# Patient Record
Sex: Female | Born: 1940 | Race: White | Hispanic: No | Smoking: Never smoker
Health system: Southern US, Community
[De-identification: ages and names within clinical notes are randomized; demographics above are authoritative.]

## PROBLEM LIST (undated history)

## (undated) DIAGNOSIS — M79672 Pain in left foot: Secondary | ICD-10-CM

## (undated) DIAGNOSIS — R2230 Localized swelling, mass and lump, unspecified upper limb: Secondary | ICD-10-CM

## (undated) DIAGNOSIS — M8580 Other specified disorders of bone density and structure, unspecified site: Secondary | ICD-10-CM

## (undated) DIAGNOSIS — Z8619 Personal history of other infectious and parasitic diseases: Secondary | ICD-10-CM

## (undated) DIAGNOSIS — E785 Hyperlipidemia, unspecified: Secondary | ICD-10-CM

## (undated) DIAGNOSIS — Z0000 Encounter for general adult medical examination without abnormal findings: Secondary | ICD-10-CM

## (undated) DIAGNOSIS — B019 Varicella without complication: Secondary | ICD-10-CM

## (undated) DIAGNOSIS — D709 Neutropenia, unspecified: Secondary | ICD-10-CM

## (undated) DIAGNOSIS — E559 Vitamin D deficiency, unspecified: Secondary | ICD-10-CM

## (undated) DIAGNOSIS — E079 Disorder of thyroid, unspecified: Secondary | ICD-10-CM

## (undated) DIAGNOSIS — R5383 Other fatigue: Principal | ICD-10-CM

## (undated) DIAGNOSIS — C4490 Unspecified malignant neoplasm of skin, unspecified: Secondary | ICD-10-CM

## (undated) DIAGNOSIS — D72819 Decreased white blood cell count, unspecified: Secondary | ICD-10-CM

## (undated) DIAGNOSIS — R030 Elevated blood-pressure reading, without diagnosis of hypertension: Secondary | ICD-10-CM

## (undated) DIAGNOSIS — K635 Polyp of colon: Secondary | ICD-10-CM

## (undated) DIAGNOSIS — C801 Malignant (primary) neoplasm, unspecified: Secondary | ICD-10-CM

## (undated) HISTORY — DX: Pain in left foot: M79.672

## (undated) HISTORY — DX: Neutropenia, unspecified: D70.9

## (undated) HISTORY — DX: Other fatigue: R53.83

## (undated) HISTORY — DX: Encounter for general adult medical examination without abnormal findings: Z00.00

## (undated) HISTORY — DX: Malignant (primary) neoplasm, unspecified: C80.1

## (undated) HISTORY — DX: Varicella without complication: B01.9

## (undated) HISTORY — DX: Polyp of colon: K63.5

## (undated) HISTORY — DX: Localized swelling, mass and lump, unspecified upper limb: R22.30

## (undated) HISTORY — DX: Personal history of other infectious and parasitic diseases: Z86.19

## (undated) HISTORY — DX: Decreased white blood cell count, unspecified: D72.819

## (undated) HISTORY — PX: CATARACT EXTRACTION, BILATERAL: SHX1313

## (undated) HISTORY — DX: Unspecified malignant neoplasm of skin, unspecified: C44.90

## (undated) HISTORY — DX: Disorder of thyroid, unspecified: E07.9

## (undated) HISTORY — DX: Vitamin D deficiency, unspecified: E55.9

## (undated) HISTORY — DX: Hyperlipidemia, unspecified: E78.5

## (undated) HISTORY — DX: Elevated blood-pressure reading, without diagnosis of hypertension: R03.0

## (undated) HISTORY — DX: Other specified disorders of bone density and structure, unspecified site: M85.80

---
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
## 1968-12-02 HISTORY — PX: OTHER SURGICAL HISTORY: SHX169

## 2001-12-02 HISTORY — PX: OTHER SURGICAL HISTORY: SHX169

## 2002-03-15 ENCOUNTER — Encounter: Payer: Self-pay | Admitting: Orthopedic Surgery

## 2002-03-22 ENCOUNTER — Inpatient Hospital Stay (HOSPITAL_COMMUNITY): Admission: RE | Admit: 2002-03-22 | Discharge: 2002-03-26 | Payer: Self-pay | Admitting: Orthopedic Surgery

## 2002-03-22 ENCOUNTER — Encounter: Payer: Self-pay | Admitting: Orthopedic Surgery

## 2002-03-28 ENCOUNTER — Emergency Department (HOSPITAL_COMMUNITY): Admission: EM | Admit: 2002-03-28 | Discharge: 2002-03-28 | Payer: Self-pay | Admitting: Emergency Medicine

## 2004-07-16 ENCOUNTER — Emergency Department (HOSPITAL_COMMUNITY): Admission: EM | Admit: 2004-07-16 | Discharge: 2004-07-16 | Payer: Self-pay

## 2005-07-09 ENCOUNTER — Encounter: Admission: RE | Admit: 2005-07-09 | Discharge: 2005-07-09 | Payer: Self-pay | Admitting: Family Medicine

## 2009-04-24 ENCOUNTER — Encounter: Admission: RE | Admit: 2009-04-24 | Discharge: 2009-04-24 | Payer: Self-pay | Admitting: *Deleted

## 2009-12-02 DIAGNOSIS — C801 Malignant (primary) neoplasm, unspecified: Secondary | ICD-10-CM

## 2009-12-02 DIAGNOSIS — C4490 Unspecified malignant neoplasm of skin, unspecified: Secondary | ICD-10-CM

## 2009-12-02 HISTORY — PX: OTHER SURGICAL HISTORY: SHX169

## 2009-12-02 HISTORY — DX: Malignant (primary) neoplasm, unspecified: C80.1

## 2009-12-02 HISTORY — DX: Unspecified malignant neoplasm of skin, unspecified: C44.90

## 2010-05-02 LAB — HM MAMMOGRAPHY

## 2010-05-03 ENCOUNTER — Encounter: Admission: RE | Admit: 2010-05-03 | Discharge: 2010-05-03 | Payer: Self-pay | Admitting: Family Medicine

## 2010-12-02 LAB — HM COLONOSCOPY

## 2010-12-02 LAB — HM PAP SMEAR

## 2011-04-19 NOTE — Discharge Summary (Signed)
[facility]  Patient:    Angela Buck, Angela Buck Visit Number: [id number] MRN: [id number]          Service Type: EMS Location: ED Attending Physician:  Benny Lennert Dictated by:   Marcie Bal Troncale, P.A.C. Admit Date:  03/28/2002 Discharge Date: 03/28/2002   CC:         Debroah Loop, M.D.   Discharge Summary  PRIMARY DIAGNOSES: 1. Osteoarthritis, right hip. 2. Acute blood loss anemia status post transfusion.  SECONDARY DIAGNOSES: 1. Osteoarthritis. 2. Postmenopausal. 3. History of osteopenia. 4. Recurrent herpes simplex.  SURGICAL PROCEDURE:  Right total hip arthroplasty by Ollen Gross, M.D. wit the assistance of Brian L. Wynona Neat, P.A.-C. on March 22, 2002.  Please see operative summary for further details.  CONSULTATIONS:  None.  LABORATORY DATA:  Preoperative hemoglobin 11,7, hematocrit 35.0 on April 14,2003, reached a low hemoglobin of 7.9 on March 25, 2002.  After transfusion, hemoglobin was 10.1.  Preoperative PT, INR, and PTT were 13.3, 1.0, and 32, respectively.  Postop INR was therapeutic with a range of 1.6 to 2.7.  Routine chemistries preoperatively were all within normal limits with a sodium 141, potassium 5.2, BUN 19, creatinine 0.8.  Potassium dipped to 3.6 on March 24, 2002.  Urinalysis preoperatively showed nitrites and leukocyte esterase negative with 0 to 2 white blood cells.  Postoperative x-rays of the pelvis and hip showed a right total hip prosthesis in satisfactory position.  CHIEF COMPLAINT:  Right hip pain.  HISTORY OF PRESENT ILLNESS:  Angela Buck is a 70 year old female who had had progressively worsening right hip pain to the point that the pain is interfering with her activities of daily living and is constant.  She has had limited motion of the hip and antalgic gait.  X-ray demonstrated significant osteoarthritis of the hip.  Because of the interference with her activities of daily living, she  has now elected to undergo surgical intervention on the hip. The patient received preoperative medical clearance from Dr. Eldred Manges.   HOSPITAL COURSE:  Following the surgical procedure, the patient was taken to the PACU in stable condition and transferred to the orthopedic floor in good condition.  She did well during her postoperative stay.  She received routine postoperative antibiotics and pain medications.  She was started on Coumadin for DVT prophylaxis and had a therapeutic INR prior to discharge.  Wound was examined on postop day #2 and subsequently found to be clean, dry, and intact without signs for symptoms of infection.  The patient was started on physical therapy touchdown weightbearing with total hip precautions.  She progressed well with physical therapy.  On postop day #3, the patient was symptomatic now from her anemia.  Hemoglobin was 7.9.  The patient was transfused 2 units of packed red blood cells with improvement in her hemoglobin and subjectively improved.  By postop day #4, she had ambulated 150 feet and was stable and ready for discharge home.  DISPOSITION:   The patient is being discharged home with [place].  DIET:  As tolerated.  WOUND CARE:  Once daily dressing changes to the hip.  Okay to shower once daily starting tomorrow, March 27, 2002.  ACTIVITY:  TED hose on legs.  Total hip dislocation precautions.  Touchdown weightbearing.  FOLLOWUP:  She is to follow up with Dr. Lequita Halt on Tuesday, May 6.  Call [phone] for appointment.  DISCHARGE MEDICATIONS: 1. Percocet 5/325 one or two every 4 to 6 hours as needed for pain. 2.  Robaxin 500 mg p.o. q.8h. p.r.n. spasm. 3. Coumadin per pharmacy dosing.  CONDITION UPON DISCHARGE:  Good and improved. Dictated by:   Marcie Bal Troncale, P.A.C. Attending Physician:  Benny Lennert DD:  04/05/02 TD:  04/07/02 Job: [id number]

## 2011-04-19 NOTE — H&P (Signed)
[facility]  Patient:    LORRIANN, HANSMANN Visit Number: [id number] MRN: [id number]          Service Type: Attending:  Ollen Gross, M.D. Dictated by:   Dorie Rank, P.A. Adm. Date:  03/22/02   CC:         Debroah Loop, M.D.   History and Physical  DATE OF BIRTH:  [birth year]  CHIEF COMPLAINT:  Right hip pain.  HISTORY OF PRESENT ILLNESS:  Ms. Friar is a pleasant 70 year old female who has had a long history of right hip pain for quite some time now to the point where it is interfering with her activities of daily living.  She is to the point now where her pain is constant.  On physical exam today, it was noted that she had pain with any attempts on hip range of motion.  She had flexion to about 90 degrees with rotation and of 0 external rotation to 15 degrees and abduction 20 degrees.  She walks with an antalgic gait.  Radiographs were reviewed in the office.  These demonstrated degenerative changes of the right hip.  It was felt that due to diagnostic studies, physical exam, and interference with her ADLs that she would benefit from undergoing a right total hip arthroplasty.  The risks and benefits, as well as the procedure were discussed with the patient and she elected to proceed.  She obtained medical clearance from her family medical doctor, Debroah Loop, M.D.  MEDICATIONS: 1. Vioxx 25 mg one p.o. q.d. 2. Over-the-counter hormonal supplement p.o. q.d.  ALLERGIES:  PENICILLIN causes hives.  PAST MEDICAL HISTORY:  Significant for osteoarthritis.  She is postmenopausal. Otherwise she has been in good health.  She does have a history of osteopenia and recurrent herpes simplex.  PAST SURGICAL HISTORY:  Significant for a D&C in 1973.  SOCIAL HISTORY:  The patient has a roommate who will available after surgery for her care.  She denies any tobacco use.  She drinks approximately one glass of wine per week.  She lives in a one-level  home.  FAMILY HISTORY:  Mother living at age 67.  History of osteoarthritis.  Father living at age 29 with a history of myocardial infarction and emphysema.  REVIEW OF SYSTEMS:  No fevers, chills, night sweats, or bleeding tendencies. Pulmonary:  No shortness of breath, productive cough, or hemoptysis. Cardiovascular:  The patient had a recent episode of chest pain.  She states that she was seen at [facility] and was subsequently sent to [facility].  She underwent a Cardiolite which the patient states was negative.  No episodes since that time.  GI:  No nausea, vomiting, diarrhea, or melena.  She has occasional constipation.  GU:  No hematuria, dysuria, or discharge.  Neurologic:  No seizures, headaches, or paralysis. Psychiatric:  Positive for anxiety.  PHYSICAL EXAMINATION:  An alert and oriented, well-developed, well-nourished, 70 year old female.  VITAL SIGNS:  Pulse 60, respirations 12, blood pressure 110/60.  HEENT:  The head is normocephalic and atraumatic.  The oropharynx is clear.  NECK:  Supple.  Negative for carotid bruits bilaterally.  Negative for cervical lymphadenopathy.  CHEST:  Lungs clear to auscultation bilaterally.  No wheezes, rales, or rhonchi.  BREASTS:  Not pertinent to present illness.  HEART:  S1 and S2 negative for murmurs, rubs, or gallops.  Regular rate and rhythm.  ABDOMEN:  Soft and nontender.  Positive bowel sounds.  EXTREMITIES:  Please see the history of present illness  for physical exam to the right hip.  Dorsalis pedis pulses are 2+ bilaterally.  SKIN:  Intact.  No rashes or lesions appreciated on exam.  PREOPERATIVE LABORATORY DATA AND X-RAYS:  Pending.  IMPRESSION:  Osteoarthritis to the left hip.  PLAN:  The patient will be scheduled for a left total knee arthroplasty with Ollen Gross, M.D. Dictated by:   Dorie Rank, P.A. Attending:  Ollen Gross, M.D. DD:  03/16/02 TD:  03/16/02 Job:  [id number]

## 2011-04-19 NOTE — Op Note (Signed)
[facility]  Patient:    Angela Buck, Angela Buck Visit Number: [id number] MRN: [id number]          Service Type: SUR Location: 4W [id number] [id number] Attending Physician:  Loanne Drilling Dictated by:   Ollen Gross, M.D. Proc. Date: 03/22/02 Admit Date:  03/22/2002                             Operative Report  PREOPERATIVE DIAGNOSIS:  Osteoarthritis, right hip.  POSTOPERATIVE DIAGNOSIS:  Osteoarthritis, right hip.  PROCEDURE:  Right total hip arthroplasty.  SURGEON:  Ollen Gross, M.D.  ASSISTANT:  Ottie Glazier. Wynona Neat, P.A.-C.  ANESTHESIA:  General.  ESTIMATED BLOOD LOSS:  600  DRAINS:  Hemovac x1.  COMPLICATIONS:  None.  CONDITION:  Stable to recovery.  BRIEF CLINICAL NOTE:  [place] is a 70 year old female with severe osteoarthritis of the right hip with pain refractory to nonoperative management. She presents now for right total hip arthroplasty.  DESCRIPTION OF PROCEDURE:  After successful administration of general anesthetic, the patient was placed in the left lateral decubitus position with the right side up and held with a hip positioner. The right lower extremity was isolated from her perineum with plastic drapes and prepped and draped in the usual sterile fashion. A standard posterolateral incision was made with a 10 blade in the subcutaneous tissue as well as the fascia lata which was incised in line with the skin incision. The short external rotators were isolated off the femur after the sciatic nerve was palpated and protected. Capsulectomy was then performed and the hip dislocated. The center of the femoral head is marked and trial prosthesis placed such that the center of the trial head corresponds with the center of her native femoral head. The osteotomy is made with an oscillating saw and the femur retracted anteriorly.  The capsule is removed and then acetabular exposure obtained. Acetabular reaming is initiated starting at a 45  coursing in increments of 2 to a 53 and a 54 mm pinnacle acetabular shell is placed in anatomic position. Transfixed with two dome screws. A trial 32 plus 4 neutral liner is placed.  The femur is then prepared first with the canal finder and then axial reaming up to 13.5 mm. Proximal reaming is performed up to an 18D and the sleeve machine to a small. The 18D small sleeve is placed near the proximal femur and then an 18 x 13 stem with a 36 plus 8 neck is placed. A trial 32 plus zero head is placed. The hip is reduced with outstanding stability, full extension, full external rotation, 70 degrees flexion, 40 degrees adduction, 90 degrees internal rotation, 90 degrees flexion and 90 degree internal rotation. The trials are all removed and the apex hole eliminator placed into the acetabular shell. Permanent 32 plus 4 neutral liner is then impacted into the acetabular shell. The permanent 18D small sleeve is placed proximally in the femur and then the 18 x 13 stem with a 36 plus 8 neck is placed matching her native anteversion. A 32 plus zero head is placed, hip reduced with same stability parameters. The wound was copiously irrigated with antibiotic solution and the short external rotators reattached to the femur through drill holes. The fascia lata was closed over a Hemovac drain with interrupted #1 Vicryl, subcu closed with interrupted #1 and then interrupted 2-0 Vicryl, subcuticular running 4-0 monocryl. Incision cleaned and dried. Steri-Strips and bulky sterile dressing applied. Drains  hooked to suction. She was placed into a knee immobilizer, awakened and transported to recovery in stable condition. Dictated by:   Ollen Gross, M.D. Attending Physician:  Loanne Drilling DD:  03/22/02 TD:  03/22/02 Job: [id number]

## 2011-11-22 ENCOUNTER — Emergency Department (HOSPITAL_COMMUNITY)
Admission: EM | Admit: 2011-11-22 | Discharge: 2011-11-23 | Disposition: A | Discharge status: Home / Self Care | Payer: Medicare Other | Attending: Emergency Medicine | Admitting: Emergency Medicine

## 2011-11-22 ENCOUNTER — Encounter: Payer: Self-pay | Admitting: *Deleted

## 2011-11-22 ENCOUNTER — Emergency Department (HOSPITAL_COMMUNITY): Payer: Medicare Other

## 2011-11-22 DIAGNOSIS — M79609 Pain in unspecified limb: Secondary | ICD-10-CM | POA: Insufficient documentation

## 2011-11-22 DIAGNOSIS — M7730 Calcaneal spur, unspecified foot: Secondary | ICD-10-CM | POA: Insufficient documentation

## 2011-11-22 DIAGNOSIS — W010XXA Fall on same level from slipping, tripping and stumbling without subsequent striking against object, initial encounter: Secondary | ICD-10-CM | POA: Insufficient documentation

## 2011-11-22 DIAGNOSIS — W19XXXA Unspecified fall, initial encounter: Secondary | ICD-10-CM

## 2011-11-22 DIAGNOSIS — M79672 Pain in left foot: Secondary | ICD-10-CM

## 2011-11-22 DIAGNOSIS — T1490XA Injury, unspecified, initial encounter: Secondary | ICD-10-CM | POA: Insufficient documentation

## 2011-11-22 NOTE — ED Notes (Signed)
Pt in c/o fall and left foot pain, increased pain with walking, also hit head, denies LOC

## 2011-11-22 NOTE — ED Provider Notes (Signed)
History   Pt sts she was walking when she misstep, fall onto her L hip and may have twisted her L foot.  She did hit a pot with her head but denies significant headache or LOC.  Denies neck pain.  Pt c/o L lateral thigh pain but no hip pain.  Also complain of pain to lateral aspect of L foot but denies L ankle pain.  She denies prior near syncopal episode, lightheadedness, dizziness, or cp.  She is able to ambulate afterward  CSN: [id number]  Arrival date & time 11/22/11  2233   First MD Initiated Contact with Patient 11/22/11 2304      Chief Complaint  Patient presents with  . Fall  . Foot Pain    (Consider location/radiation/quality/duration/timing/severity/associated sxs/prior treatment) HPI  History reviewed. No pertinent past medical history.  History reviewed. No pertinent past surgical history.  History reviewed. No pertinent family history.  History  Substance Use Topics  . Smoking status: Never Smoker   . Smokeless tobacco: Not on file  . Alcohol Use: Yes    OB History    Grav Para Term Preterm Abortions TAB SAB Ect Mult Living                  Review of Systems  All other systems reviewed and are negative.    Allergies  Penicillins  Home Medications  No current outpatient prescriptions on file.  BP 119/48  Temp 98 F (36.7 C)  Resp 20  SpO2 99%  Physical Exam  Vitals reviewed. Constitutional: She appears well-developed and well-nourished.  HENT:  Head: Normocephalic and atraumatic.       Scalp nontender on palpation, no overlying skin changes.   Eyes: Conjunctivae and EOM are normal. Pupils are equal, round, and reactive to light.  Neck: Normal range of motion. Neck supple.  Cardiovascular: Normal rate and regular rhythm.   Pulmonary/Chest: Effort normal. She exhibits no tenderness.  Musculoskeletal: Normal range of motion. She exhibits no edema.       Left hip: Normal. She exhibits normal range of motion, normal strength, no tenderness, no  bony tenderness and no swelling.       Left ankle: She exhibits normal range of motion, no swelling and no deformity. tenderness. Head of 5th metatarsal tenderness found. No lateral malleolus, no medial malleolus and no proximal fibula tenderness found.    ED Course  Procedures (including critical care time)  Labs Reviewed - No data to display No results found.   No diagnosis found.    MDM  Pt c/o pain primarily L 5th metatarsal with no obvious deformity noted.  L ankle with FROM and nontender.  Will obtain L foot xray to r/o Jones fx.  Head exam and L hip exam is unremarkable.     12:06 AM Xray of L foot is negative for fx.  Pt able to ambulate, will discharge      Fayrene Helper, PA 11/23/11 0010

## 2011-11-22 NOTE — ED Notes (Signed)
Patient states that she was stepping down and missed a step. She feels pain to her left foot. The patient walks with a limp

## 2011-11-25 NOTE — ED Provider Notes (Signed)
Medical screening examination/treatment/procedure(s) were performed by non-physician practitioner and as supervising physician I was immediately available for consultation/collaboration.  Raeford Razor, MD 11/25/11 1051

## 2011-12-03 LAB — HM MAMMOGRAPHY: HM Mammogram: NORMAL

## 2011-12-19 ENCOUNTER — Other Ambulatory Visit (HOSPITAL_COMMUNITY): Payer: Self-pay | Admitting: Dermatology

## 2011-12-19 ENCOUNTER — Ambulatory Visit (HOSPITAL_COMMUNITY)
Admission: RE | Admit: 2011-12-19 | Discharge: 2011-12-19 | Disposition: A | Discharge status: Home / Self Care | Payer: Medicare Other | Source: Ambulatory Visit | Attending: Dermatology | Admitting: Dermatology

## 2011-12-19 DIAGNOSIS — R229 Localized swelling, mass and lump, unspecified: Secondary | ICD-10-CM | POA: Insufficient documentation

## 2011-12-19 DIAGNOSIS — R52 Pain, unspecified: Secondary | ICD-10-CM

## 2011-12-31 ENCOUNTER — Ambulatory Visit (INDEPENDENT_AMBULATORY_CARE_PROVIDER_SITE_OTHER): Payer: Medicare Other | Admitting: Family Medicine

## 2011-12-31 ENCOUNTER — Encounter: Payer: Self-pay | Admitting: Family Medicine

## 2011-12-31 VITALS — BP 142/78 | HR 65 | Temp 98.2°F | Ht 64.25 in | Wt 153.4 lb

## 2011-12-31 DIAGNOSIS — C4490 Unspecified malignant neoplasm of skin, unspecified: Secondary | ICD-10-CM

## 2011-12-31 DIAGNOSIS — Z0000 Encounter for general adult medical examination without abnormal findings: Secondary | ICD-10-CM

## 2011-12-31 DIAGNOSIS — M899 Disorder of bone, unspecified: Secondary | ICD-10-CM

## 2011-12-31 DIAGNOSIS — M8580 Other specified disorders of bone density and structure, unspecified site: Secondary | ICD-10-CM

## 2011-12-31 DIAGNOSIS — R030 Elevated blood-pressure reading, without diagnosis of hypertension: Secondary | ICD-10-CM

## 2011-12-31 DIAGNOSIS — K635 Polyp of colon: Secondary | ICD-10-CM

## 2011-12-31 DIAGNOSIS — R2230 Localized swelling, mass and lump, unspecified upper limb: Secondary | ICD-10-CM

## 2011-12-31 DIAGNOSIS — R229 Localized swelling, mass and lump, unspecified: Secondary | ICD-10-CM

## 2011-12-31 DIAGNOSIS — R591 Generalized enlarged lymph nodes: Secondary | ICD-10-CM

## 2011-12-31 DIAGNOSIS — IMO0001 Reserved for inherently not codable concepts without codable children: Secondary | ICD-10-CM

## 2011-12-31 DIAGNOSIS — Z8619 Personal history of other infectious and parasitic diseases: Secondary | ICD-10-CM | POA: Insufficient documentation

## 2011-12-31 DIAGNOSIS — R599 Enlarged lymph nodes, unspecified: Secondary | ICD-10-CM

## 2011-12-31 NOTE — Patient Instructions (Signed)

## 2012-01-05 ENCOUNTER — Encounter: Payer: Self-pay | Admitting: Family Medicine

## 2012-01-05 DIAGNOSIS — R2230 Localized swelling, mass and lump, unspecified upper limb: Secondary | ICD-10-CM

## 2012-01-05 DIAGNOSIS — K635 Polyp of colon: Secondary | ICD-10-CM

## 2012-01-05 DIAGNOSIS — IMO0001 Reserved for inherently not codable concepts without codable children: Secondary | ICD-10-CM

## 2012-01-05 DIAGNOSIS — M810 Age-related osteoporosis without current pathological fracture: Secondary | ICD-10-CM | POA: Insufficient documentation

## 2012-01-05 DIAGNOSIS — Z0000 Encounter for general adult medical examination without abnormal findings: Secondary | ICD-10-CM

## 2012-01-05 DIAGNOSIS — M8580 Other specified disorders of bone density and structure, unspecified site: Secondary | ICD-10-CM

## 2012-01-05 DIAGNOSIS — R030 Elevated blood-pressure reading, without diagnosis of hypertension: Secondary | ICD-10-CM | POA: Insufficient documentation

## 2012-01-05 HISTORY — DX: Other specified disorders of bone density and structure, unspecified site: M85.80

## 2012-01-05 HISTORY — DX: Reserved for inherently not codable concepts without codable children: IMO0001

## 2012-01-05 HISTORY — DX: Encounter for general adult medical examination without abnormal findings: Z00.00

## 2012-01-05 HISTORY — DX: Localized swelling, mass and lump, unspecified upper limb: R22.30

## 2012-01-05 HISTORY — DX: Polyp of colon: K63.5

## 2012-01-05 NOTE — Assessment & Plan Note (Signed)
Recent ultrasound showed a benign appearing lesion, radiology recommends follow up Ultrasound in 2-3 months, will proceed with this

## 2012-01-05 NOTE — Assessment & Plan Note (Signed)
Encouraged heart healthy diet, minimize sodium. Will recheck with labwork at next visit.

## 2012-01-05 NOTE — Assessment & Plan Note (Signed)
No GI concerns

## 2012-01-05 NOTE — Assessment & Plan Note (Signed)
Patient took Fosamax for a short time, does not wish to take this again. Will continue to calcium supplement. Continue exercise.

## 2012-01-05 NOTE — Progress Notes (Signed)
Patient ID: Angela Buck, female   DOB: [birth year], 71 y.o.   MRN: [id number] Angela Buck [id number] 1941-11-01 01/05/2012      Progress Note New Patient  Subjective  Chief Complaint  Chief Complaint  Patient presents with  . Establish Care    new patient    HPI  Patient is a 71 year old Caucasian female who is in today for new patient appointment she has had no recent illness, fevers, chills, chest pain, palpitations, shortness of breath, GI or GU complaints. She reports generally been in good health. She does have a dermatologist whom she recently saw for routine followup. During the examination her dermatologist today show that her left axilla, soft tissue. Ultrasound revealed a benign-appearing lesion and the patient has no discomfort in this region. Had suffered a fall on her left side a couple months ago and hurt her left shoulder and ankle and head but all of the pain from that had subsided  Past Medical History  Diagnosis Date  . Chicken pox as a child  . Measles as a child  . 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 History of mumps   . Colonic polyp 01/05/2012  . Skin cancer 12/02/2009  . Osteopenia 01/05/2012  . Preventative health care 01/05/2012  . Mass of axilla 01/05/2012  . Elevated BP 01/05/2012    Past Surgical History  Procedure Date  . Right hip replacement 2003  . Biopsy of jawline 2011  . Laproscopy 1970    abdominal, endometriosis, with D&C  . Tonsillectomy and adenoidectomy 1949  . Cataract extraction, bilateral     b/l    Family History  Problem Relation Age of Onset  . Arthritis Mother   . Hypertension Mother   . Dementia Mother   . Heart attack Mother   . Glaucoma Mother   . Heart disease Mother     MI, stent in 2004  . Emphysema Father     smoker  . Hypertension Father   . Glaucoma Father   . COPD Father   . Other Paternal Grandfather     enlarged heart  . Ulcers Maternal Grandmother     History   Social History  .  Marital Status: Divorced    Spouse Name: N/A    Number of Children: N/A  . Years of Education: N/A   Occupational History  . Not on file.   Social History Main Topics  . Smoking status: Never Smoker   . Smokeless tobacco: Never Used  . Alcohol Use: Yes     occasional wine  . Drug Use: No  . Sexually Active: Yes -- Female partner(s)   Other Topics Concern  . Not on file   Social History Narrative  . No narrative on file    No current outpatient prescriptions on file prior to visit.    Allergies  Allergen Reactions  . Bee Venom   . Penicillins Hives    Review of Systems  Review of Systems  Constitutional: Negative for fever, chills and malaise/fatigue.  HENT: Negative for hearing loss, nosebleeds and congestion.   Eyes: Negative for discharge.  Respiratory: Negative for cough, sputum production, shortness of breath and wheezing.   Cardiovascular: Negative for chest pain, palpitations and leg swelling.  Gastrointestinal: Negative for heartburn, nausea, vomiting, abdominal pain, diarrhea, constipation and blood in stool.  Genitourinary: Negative for dysuria, urgency, frequency and hematuria.  Musculoskeletal: Negative for myalgias, back pain and falls.  Skin: Negative for rash.  Neurological: Negative for dizziness, tremors, sensory change, focal weakness, loss of consciousness, weakness and headaches.  Endo/Heme/Allergies: Negative for polydipsia. Does not bruise/bleed easily.  Psychiatric/Behavioral: Negative for depression and suicidal ideas. The patient is not nervous/anxious and does not have insomnia.     Objective  BP 142/78  Pulse 65  Temp(Src) 98.2 F (36.8 C) (Temporal)  Ht 5' 4.25" (1.632 m)  Wt 153 lb 6.4 oz (69.582 kg)  BMI 26.13 kg/m2  SpO2 99%  Physical Exam  Physical Exam  Constitutional: She is oriented to person, place, and time and well-developed, well-nourished, and in no distress. No distress.  HENT:  Head: Normocephalic and  atraumatic.  Right Ear: External ear normal.  Left Ear: External ear normal.  Nose: Nose normal.  Mouth/Throat: Oropharynx is clear and moist. No oropharyngeal exudate.  Eyes: Conjunctivae are normal. Pupils are equal, round, and reactive to light. Right eye exhibits no discharge. Left eye exhibits no discharge. No scleral icterus.  Neck: Normal range of motion. Neck supple. No thyromegaly present.  Cardiovascular: Normal rate, regular rhythm, normal heart sounds and intact distal pulses.   No murmur heard. Pulmonary/Chest: Effort normal and breath sounds normal. No respiratory distress. She has no wheezes. She has no rales.  Abdominal: Soft. Bowel sounds are normal. She exhibits no distension and no mass. There is no tenderness.  Musculoskeletal: Normal range of motion. She exhibits no edema and no tenderness.  Lymphadenopathy:    She has no cervical adenopathy.  Neurological: She is alert and oriented to person, place, and time. She has normal reflexes. No cranial nerve deficit. Coordination normal.  Skin: Skin is warm and dry. No rash noted. She is not diaphoretic.  Psychiatric: Mood, memory and affect normal.       Assessment & Plan  Preventative health care Patient declines flu and Tdap shots, eats a heart healthy diet and exercises regularly  Mass of axilla Recent ultrasound showed a benign appearing lesion, radiology recommends follow up Ultrasound in 2-3 months, will proceed with this  Osteopenia Patient took Fosamax for a short time, does not wish to take this again. Will continue to calcium supplement. Continue exercise.   Colonic polyp No GI concerns  Skin cancer Unclear which type, fairly confident it was not Melanoma  Elevated BP Encouraged heart healthy diet, minimize sodium. Will recheck with labwork at next visit.

## 2012-01-05 NOTE — Assessment & Plan Note (Signed)
Patient declines flu and Tdap shots, eats a heart healthy diet and exercises regularly

## 2012-01-05 NOTE — Assessment & Plan Note (Signed)
Unclear which type, fairly confident it was not Melanoma

## 2012-01-07 ENCOUNTER — Other Ambulatory Visit: Payer: Self-pay | Admitting: Family Medicine

## 2012-01-07 DIAGNOSIS — Z1231 Encounter for screening mammogram for malignant neoplasm of breast: Secondary | ICD-10-CM

## 2012-01-09 ENCOUNTER — Other Ambulatory Visit: Payer: Self-pay

## 2012-01-09 ENCOUNTER — Telehealth: Payer: Self-pay

## 2012-01-09 NOTE — Telephone Encounter (Signed)
Med list updated

## 2012-01-16 ENCOUNTER — Ambulatory Visit
Admission: RE | Admit: 2012-01-16 | Discharge: 2012-01-16 | Disposition: A | Discharge status: Home / Self Care | Payer: Medicare Other | Source: Ambulatory Visit | Attending: Family Medicine | Admitting: Family Medicine

## 2012-01-16 DIAGNOSIS — Z1231 Encounter for screening mammogram for malignant neoplasm of breast: Secondary | ICD-10-CM

## 2012-02-26 ENCOUNTER — Encounter: Payer: Self-pay | Admitting: Family Medicine

## 2012-02-26 NOTE — Telephone Encounter (Signed)
Please advise pts mychart question?

## 2012-03-16 ENCOUNTER — Encounter: Payer: Self-pay | Admitting: Family Medicine

## 2012-03-16 ENCOUNTER — Other Ambulatory Visit: Payer: Self-pay | Admitting: Family Medicine

## 2012-03-16 ENCOUNTER — Ambulatory Visit (HOSPITAL_BASED_OUTPATIENT_CLINIC_OR_DEPARTMENT_OTHER)
Admission: RE | Admit: 2012-03-16 | Discharge: 2012-03-16 | Disposition: A | Discharge status: Home / Self Care | Payer: Medicare Other | Source: Ambulatory Visit | Attending: Family Medicine | Admitting: Family Medicine

## 2012-03-16 ENCOUNTER — Ambulatory Visit (INDEPENDENT_AMBULATORY_CARE_PROVIDER_SITE_OTHER): Payer: Medicare Other | Admitting: Family Medicine

## 2012-03-16 ENCOUNTER — Ambulatory Visit (INDEPENDENT_AMBULATORY_CARE_PROVIDER_SITE_OTHER)
Admission: RE | Admit: 2012-03-16 | Discharge: 2012-03-16 | Disposition: A | Discharge status: Home / Self Care | Payer: Medicare Other | Source: Ambulatory Visit | Attending: Family Medicine | Admitting: Family Medicine

## 2012-03-16 VITALS — BP 142/68 | HR 60 | Temp 97.6°F | Ht 64.25 in | Wt 153.8 lb

## 2012-03-16 DIAGNOSIS — R5381 Other malaise: Secondary | ICD-10-CM

## 2012-03-16 DIAGNOSIS — R2230 Localized swelling, mass and lump, unspecified upper limb: Secondary | ICD-10-CM

## 2012-03-16 DIAGNOSIS — M545 Low back pain, unspecified: Secondary | ICD-10-CM

## 2012-03-16 DIAGNOSIS — E041 Nontoxic single thyroid nodule: Secondary | ICD-10-CM

## 2012-03-16 DIAGNOSIS — R109 Unspecified abdominal pain: Secondary | ICD-10-CM | POA: Insufficient documentation

## 2012-03-16 DIAGNOSIS — IMO0001 Reserved for inherently not codable concepts without codable children: Secondary | ICD-10-CM

## 2012-03-16 DIAGNOSIS — E049 Nontoxic goiter, unspecified: Secondary | ICD-10-CM | POA: Insufficient documentation

## 2012-03-16 DIAGNOSIS — R5383 Other fatigue: Secondary | ICD-10-CM

## 2012-03-16 DIAGNOSIS — R030 Elevated blood-pressure reading, without diagnosis of hypertension: Secondary | ICD-10-CM

## 2012-03-16 DIAGNOSIS — E079 Disorder of thyroid, unspecified: Secondary | ICD-10-CM

## 2012-03-16 DIAGNOSIS — R229 Localized swelling, mass and lump, unspecified: Secondary | ICD-10-CM

## 2012-03-16 DIAGNOSIS — E039 Hypothyroidism, unspecified: Secondary | ICD-10-CM | POA: Insufficient documentation

## 2012-03-16 HISTORY — DX: Other fatigue: R53.83

## 2012-03-16 HISTORY — DX: Disorder of thyroid, unspecified: E07.9

## 2012-03-16 NOTE — Assessment & Plan Note (Signed)
Check CBC, thyroid studies, cbc, renal and reassess after labs and imaging complete. Consider sleep study if no cause found and fatigue persists. She does report snoring

## 2012-03-16 NOTE — Progress Notes (Signed)
Patient ID: Angela Buck, female   DOB: 12-24-1940, 71 y.o.   MRN: [id number] Angela Buck [id number] 05/27/1941 03/16/2012      Progress Note-Follow Up  Subjective  Chief Complaint  Chief Complaint  Patient presents with  . fatigue    wants TSH and T4 checked    HPI  Patient is a 71 year old Caucasian female who is in today complaining of persistent fatigue several months. He has been feeling tired and just posterior slope for somewhere between 4 and 6 months. Her partner who accompanies her today says she feels as if she's had some pallor and fatigue for about a month now. She denies any recent illness, fevers, chest pain, palpitations, shortness of breath, GI or at this time. She says she receives about 8 hours tonight but still wakes up fatigued. Does snore but denies nocturnal choking or frequent awakening. Wakes up roughly once a night but is able to fall back to sleep  Past Medical History  Diagnosis Date  . Chicken pox as a child  . Measles as a child  . 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 History of mumps   . Colonic polyp 01/05/2012  . Skin cancer 12/02/2009  . Osteopenia 01/05/2012  . Preventative health care 01/05/2012  . Mass of axilla 01/05/2012  . Elevated BP 01/05/2012  . Abdominal pain 03/16/2012  . Fatigue 03/16/2012    Past Surgical History  Procedure Date  . Right hip replacement 2003  . Biopsy of jawline 2011  . Laproscopy 1970    abdominal, endometriosis, with D&C  . Tonsillectomy and adenoidectomy 1949  . Cataract extraction, bilateral     b/l    Family History  Problem Relation Age of Onset  . Arthritis Mother   . Hypertension Mother   . Dementia Mother   . Heart attack Mother   . Glaucoma Mother   . Heart disease Mother     MI, stent in 2004  . Emphysema Father     smoker  . Hypertension Father   . Glaucoma Father   . COPD Father   . Other Paternal Grandfather     enlarged heart  . Ulcers Maternal Grandmother     History     Social History  . Marital Status: Divorced    Spouse Name: N/A    Number of Children: N/A  . Years of Education: N/A   Occupational History  . Not on file.   Social History Main Topics  . Smoking status: Never Smoker   . Smokeless tobacco: Never Used  . Alcohol Use: Yes     occasional wine  . Drug Use: No  . Sexually Active: Yes -- Female partner(s)   Other Topics Concern  . Not on file   Social History Narrative  . No narrative on file    Current Outpatient Prescriptions on File Prior to Visit  Medication Sig Dispense Refill  . CALCIUM CITRATE PO Take 2 tablets by mouth 2 (two) times daily.      . calcium citrate-vitamin D 200-200 MG-UNIT TABS Take 2 tablets by mouth 2 (two) times daily.      Marland Kitchen CALCIUM LACTATE PO Take 2 tablets by mouth 2 (two) times daily.      Marland Kitchen OVER THE COUNTER MEDICATION LypoZyme 1 tablet tid      . OVER THE COUNTER MEDICATION Balance Zyme- 1 tablet tid      . OVER THE COUNTER MEDICATION Catalyst 7- 1 tablet  tid      . OVER THE COUNTER MEDICATION Alka-C/ 1/2 tsp bid      . OVER THE COUNTER MEDICATION Pleo Alkala- 1/2 tsp bid      . OVER THE COUNTER MEDICATION Bio Ae-Mulsion-Forte 1 tablet daily      . OVER THE COUNTER MEDICATION Simonne Come Nig & Pleo Citro 1 weekly for 10 weeks      . PRESCRIPTION MEDICATION 5 FU 5% Cream made at [facility]      . Vitamin Mixture (VITAMIN E-SELENIUM PO) Take 1 tablet by mouth daily.        Allergies  Allergen Reactions  . Bee Venom   . Penicillins Hives    Review of Systems  Review of Systems  Constitutional: Positive for malaise/fatigue. Negative for fever.  HENT: Negative for congestion and neck pain.   Eyes: Negative for discharge.  Respiratory: Positive for cough. Negative for shortness of breath.   Cardiovascular: Negative for chest pain, palpitations and leg swelling.  Gastrointestinal: Positive for abdominal pain. Negative for nausea and diarrhea.  Genitourinary: Negative for dysuria.   Musculoskeletal: Negative for myalgias, back pain and falls.  Skin: Negative for rash.  Neurological: Negative for loss of consciousness and headaches.  Endo/Heme/Allergies: Negative for polydipsia.  Psychiatric/Behavioral: Negative for depression and suicidal ideas. The patient is not nervous/anxious and does not have insomnia.     Objective  BP 142/68  Pulse 60  Temp(Src) 97.6 F (36.4 C) (Temporal)  Ht 5' 4.25" (1.632 m)  Wt 153 lb 12.8 oz (69.763 kg)  BMI 26.19 kg/m2  SpO2 99%  Physical Exam  Physical Exam  Constitutional: She is oriented to person, place, and time and well-developed, well-nourished, and in no distress. No distress.  HENT:  Head: Normocephalic and atraumatic.  Eyes: Conjunctivae are normal.  Neck: Neck supple. Thyromegaly present.       Right lobe of thyroid is slightly enlarged, no discrete nodule palpated  Cardiovascular: Normal rate, regular rhythm and normal heart sounds.   No murmur heard. Pulmonary/Chest: Effort normal and breath sounds normal. She has no wheezes.  Abdominal: Soft. Bowel sounds are normal. She exhibits mass. She exhibits no distension. There is tenderness.       LUQ 3 cm firm lesion, mildly tender withpalpation  Musculoskeletal: She exhibits no edema.       No mass palpated in left axillae any longer  Lymphadenopathy:    She has no cervical adenopathy.  Neurological: She is alert and oriented to person, place, and time.  Skin: Skin is warm and dry. No rash noted. She is not diaphoretic.  Psychiatric: Memory, affect and judgment normal.      Assessment & Plan  Mass of axilla Agrees to go for follow up Ultrasound as recommended.  Elevated BP Mildly elevated again today, minimize sodium, check a thyroid panel and reassess at next visit.  Enlarged thyroid Agrees to thyroid ultrasound, check thyroid studies  Abdominal pain Epigastric and LUQ with palpation, firm area in luq roughly 3 cm in diameter, possibly stool but  with patient symptoms will check an abdominal US  Fatigue Check CBC, thyroid studies, cbc, renal and reassess after labs and imaging complete. Consider sleep study if no cause found and fatigue persists. She does report snoring

## 2012-03-16 NOTE — Assessment & Plan Note (Signed)
Agrees to go for follow up Ultrasound as recommended.

## 2012-03-16 NOTE — Assessment & Plan Note (Signed)
Mildly elevated again today, minimize sodium, check a thyroid panel and reassess at next visit.

## 2012-03-16 NOTE — Assessment & Plan Note (Signed)
Agrees to thyroid ultrasound, check thyroid studies

## 2012-03-16 NOTE — Patient Instructions (Addendum)

## 2012-03-16 NOTE — Assessment & Plan Note (Signed)
Epigastric and LUQ with palpation, firm area in luq roughly 3 cm in diameter, possibly stool but with patient symptoms will check an abdominal US

## 2012-03-17 ENCOUNTER — Ambulatory Visit (HOSPITAL_BASED_OUTPATIENT_CLINIC_OR_DEPARTMENT_OTHER)
Admission: RE | Admit: 2012-03-17 | Discharge: 2012-03-17 | Disposition: A | Discharge status: Home / Self Care | Payer: Medicare Other | Source: Ambulatory Visit | Attending: Family Medicine | Admitting: Family Medicine

## 2012-03-17 DIAGNOSIS — R1902 Left upper quadrant abdominal swelling, mass and lump: Secondary | ICD-10-CM | POA: Insufficient documentation

## 2012-03-17 DIAGNOSIS — R5383 Other fatigue: Secondary | ICD-10-CM

## 2012-03-17 DIAGNOSIS — R5381 Other malaise: Secondary | ICD-10-CM

## 2012-03-17 DIAGNOSIS — R109 Unspecified abdominal pain: Secondary | ICD-10-CM

## 2012-03-17 LAB — CBC
HCT: 36.5 % (ref 36.0–46.0)
Hemoglobin: 11.8 g/dL — ABNORMAL LOW (ref 12.0–15.0)
MCH: 29.6 pg (ref 26.0–34.0)
MCHC: 32.3 g/dL (ref 30.0–36.0)
MCV: 91.5 fL (ref 78.0–100.0)
Platelets: 282 10*3/uL (ref 150–400)
RBC: 3.99 MIL/uL (ref 3.87–5.11)
RDW: 13.4 % (ref 11.5–15.5)
WBC: 5.3 10*3/uL (ref 4.0–10.5)

## 2012-03-17 LAB — HEPATIC FUNCTION PANEL
ALT: 13 U/L (ref 0–35)
AST: 21 U/L (ref 0–37)
Albumin: 3.8 g/dL (ref 3.5–5.2)
Alkaline Phosphatase: 55 U/L (ref 39–117)
Bilirubin, Direct: 0.1 mg/dL (ref 0.0–0.3)
Indirect Bilirubin: 0.2 mg/dL (ref 0.0–0.9)
Total Bilirubin: 0.3 mg/dL (ref 0.3–1.2)
Total Protein: 6.1 g/dL (ref 6.0–8.3)

## 2012-03-17 LAB — BASIC METABOLIC PANEL
BUN: 13 mg/dL (ref 6–23)
CO2: 29 mEq/L (ref 19–32)
Calcium: 9.4 mg/dL (ref 8.4–10.5)
Chloride: 107 mEq/L (ref 96–112)
Creat: 0.84 mg/dL (ref 0.50–1.10)
Glucose, Bld: 92 mg/dL (ref 70–99)
Potassium: 4.6 mEq/L (ref 3.5–5.3)
Sodium: 141 mEq/L (ref 135–145)

## 2012-03-17 LAB — PHOSPHORUS: Phosphorus: 3.7 mg/dL (ref 2.3–4.6)

## 2012-03-17 LAB — T4, FREE: Free T4: 1.04 ng/dL (ref 0.80–1.80)

## 2012-03-17 LAB — T3, FREE: T3, Free: 2.7 pg/mL (ref 2.3–4.2)

## 2012-03-17 LAB — TSH: TSH: 2.225 u[IU]/mL (ref 0.350–4.500)

## 2012-03-30 ENCOUNTER — Ambulatory Visit: Payer: Medicare Other | Admitting: Family Medicine

## 2012-07-01 ENCOUNTER — Telehealth: Payer: Self-pay

## 2012-07-01 NOTE — Telephone Encounter (Signed)
Left a detailed message on patients cell phone

## 2012-07-01 NOTE — Telephone Encounter (Signed)
Per MD inform pt that we received DEXA results and pt has Osteopenia- no changes

## 2012-11-12 ENCOUNTER — Ambulatory Visit (INDEPENDENT_AMBULATORY_CARE_PROVIDER_SITE_OTHER): Payer: Medicare Other | Admitting: Family Medicine

## 2012-11-12 ENCOUNTER — Encounter: Payer: Self-pay | Admitting: Family Medicine

## 2012-11-12 VITALS — BP 131/73 | HR 77 | Temp 97.6°F | Ht 64.25 in | Wt 143.0 lb

## 2012-11-12 DIAGNOSIS — R5383 Other fatigue: Secondary | ICD-10-CM

## 2012-11-12 DIAGNOSIS — E049 Nontoxic goiter, unspecified: Secondary | ICD-10-CM

## 2012-11-12 DIAGNOSIS — R030 Elevated blood-pressure reading, without diagnosis of hypertension: Secondary | ICD-10-CM

## 2012-11-12 DIAGNOSIS — R109 Unspecified abdominal pain: Secondary | ICD-10-CM

## 2012-11-12 DIAGNOSIS — IMO0001 Reserved for inherently not codable concepts without codable children: Secondary | ICD-10-CM

## 2012-11-12 DIAGNOSIS — R5381 Other malaise: Secondary | ICD-10-CM

## 2012-11-12 LAB — CBC
HCT: 37.1 % (ref 36.0–46.0)
Hemoglobin: 12.5 g/dL (ref 12.0–15.0)
MCHC: 33.8 g/dL (ref 30.0–36.0)
MCV: 91.7 fl (ref 78.0–100.0)
Platelets: 249 10*3/uL (ref 150.0–400.0)
RBC: 4.05 Mil/uL (ref 3.87–5.11)
RDW: 12.9 % (ref 11.5–14.6)
WBC: 4.2 10*3/uL — ABNORMAL LOW (ref 4.5–10.5)

## 2012-11-12 LAB — TSH: TSH: 0.97 u[IU]/mL (ref 0.35–5.50)

## 2012-11-12 LAB — T3, FREE: T3, Free: 2.7 pg/mL (ref 2.3–4.2)

## 2012-11-12 LAB — T4, FREE: Free T4: 0.89 ng/dL (ref 0.60–1.60)

## 2012-11-12 NOTE — Progress Notes (Signed)
Patient ID: Angela Buck, female   DOB: Oct 18, 1941, 71 y.o.   MRN: [id number] Angela Buck [id number] 16-Feb-1941 11/12/2012      Progress Note-Follow Up  Subjective  Chief Complaint  Chief Complaint  Patient presents with  . Follow-up    check thyroid    HPI  Patient is a 71 year old Caucasian female who is in today for followup. Overall she feels well. She's not had any recent illness fevers chills, chest pain or palpitations, shortness of breath, GI or GU concerns. She's recently returned from a visit to her [facility] for Candida and she says he is concerned that her thyroid is off and she is requesting to be tested. No other acute complaints. They have changed her diet dramatically in her voiding oh gluten corn in. She notes weight loss and overall feeling better  Past Medical History  Diagnosis Date  . Chicken pox as a child  . Measles as a child  . 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 History of mumps   . Colonic polyp 01/05/2012  . Skin cancer 12/02/2009  . Osteopenia 01/05/2012  . Preventative health care 01/05/2012  . Mass of axilla 01/05/2012  . Elevated BP 01/05/2012  . Abdominal pain 03/16/2012  . Fatigue 03/16/2012    Past Surgical History  Procedure Date  . Right hip replacement 2003  . Biopsy of jawline 2011  . Laproscopy 1970    abdominal, endometriosis, with D&C  . Tonsillectomy and adenoidectomy 1949  . Cataract extraction, bilateral     b/l    Family History  Problem Relation Age of Onset  . Arthritis Mother   . Hypertension Mother   . Dementia Mother   . Heart attack Mother   . Glaucoma Mother   . Heart disease Mother     MI, stent in 2004  . Emphysema Father     smoker  . Hypertension Father   . Glaucoma Father   . COPD Father   . Other Paternal Grandfather     enlarged heart  . Ulcers Maternal Grandmother     History   Social History  . Marital Status: Divorced    Spouse Name: N/A    Number of Children: N/A  . Years of  Education: N/A   Occupational History  . Not on file.   Social History Main Topics  . Smoking status: Never Smoker   . Smokeless tobacco: Never Used  . Alcohol Use: Yes     Comment: occasional wine  . Drug Use: No  . Sexually Active: Yes -- Female partner(s)   Other Topics Concern  . Not on file   Social History Narrative  . No narrative on file    Current Outpatient Prescriptions on File Prior to Visit  Medication Sig Dispense Refill  . CALCIUM CITRATE PO Take 2 tablets by mouth 2 (two) times daily.      . calcium citrate-vitamin D 200-200 MG-UNIT TABS Take 2 tablets by mouth 2 (two) times daily.      Marland Kitchen CALCIUM LACTATE PO Take 2 tablets by mouth 2 (two) times daily.      Marland Kitchen OVER THE COUNTER MEDICATION LypoZyme 1 tablet tid      . OVER THE COUNTER MEDICATION Balance Zyme- 1 tablet tid      . OVER THE COUNTER MEDICATION Catalyst 7- 1 tablet tid      . OVER THE COUNTER MEDICATION Alka-C/ 1/2 tsp bid      .  OVER THE COUNTER MEDICATION Pleo Alkala- 1/2 tsp bid      . OVER THE COUNTER MEDICATION Bio Ae-Mulsion-Forte 1 tablet daily      . OVER THE COUNTER MEDICATION Simonne Come Nig & Pleo Citro 1 weekly for 10 weeks      . PRESCRIPTION MEDICATION 5 FU 5% Cream made at [facility]      . Vitamin Mixture (VITAMIN E-SELENIUM PO) Take 1 tablet by mouth daily.        Allergies  Allergen Reactions  . Bee Venom   . Penicillins Hives    Review of Systems  Review of Systems  Constitutional: Positive for malaise/fatigue. Negative for fever.  HENT: Negative for congestion.   Eyes: Negative for discharge.  Respiratory: Negative for shortness of breath.   Cardiovascular: Negative for chest pain, palpitations and leg swelling.  Gastrointestinal: Negative for nausea, abdominal pain and diarrhea.  Genitourinary: Negative for dysuria.  Musculoskeletal: Negative for falls.  Skin: Negative for rash.  Neurological: Negative for loss of consciousness and headaches.  Endo/Heme/Allergies:  Negative for polydipsia.  Psychiatric/Behavioral: Negative for depression and suicidal ideas. The patient is not nervous/anxious and does not have insomnia.     Objective  BP 131/73  Pulse 77  Temp 97.6 F (36.4 C) (Temporal)  Ht 5' 4.25" (1.632 m)  Wt 143 lb (64.864 kg)  BMI 24.36 kg/m2  SpO2 100%  Physical Exam  Physical Exam  Constitutional: She is oriented to person, place, and time and well-developed, well-nourished, and in no distress. No distress.  HENT:  Head: Normocephalic and atraumatic.  Eyes: Conjunctivae normal are normal.  Neck: Neck supple. No thyromegaly present.  Cardiovascular: Normal rate, regular rhythm and normal heart sounds.   No murmur heard. Pulmonary/Chest: Effort normal and breath sounds normal. She has no wheezes.  Abdominal: She exhibits no distension and no mass.  Musculoskeletal: She exhibits no edema.  Lymphadenopathy:    She has no cervical adenopathy.  Neurological: She is alert and oriented to person, place, and time.  Skin: Skin is warm and dry. No rash noted. She is not diaphoretic.  Psychiatric: Memory, affect and judgment normal.    Lab Results  Component Value Date   TSH 0.97 11/12/2012   Lab Results  Component Value Date   WBC 4.2* 11/12/2012   HGB 12.5 11/12/2012   HCT 37.1 11/12/2012   MCV 91.7 11/12/2012   PLT 249.0 11/12/2012   Lab Results  Component Value Date   CREATININE 0.84 03/16/2012   BUN 13 03/16/2012   NA 141 03/16/2012   K 4.6 03/16/2012   CL 107 03/16/2012   CO2 29 03/16/2012   Lab Results  Component Value Date   ALT 13 03/16/2012   AST 21 03/16/2012   ALKPHOS 55 03/16/2012   BILITOT 0.3 03/16/2012    Assessment & Plan  Elevated BP Well controlled, no changes to meds  Abdominal pain Improved with significant changes in diet incluidng no wheat, sugar, dairy  Fatigue Patient is feeling well but has been asked to have her thyroid checked by her naturopath, he feels she would benefit from some  supplementation

## 2012-11-12 NOTE — Assessment & Plan Note (Signed)
Well controlled, no changes to meds 

## 2012-11-12 NOTE — Assessment & Plan Note (Signed)
Patient is feeling well but has been asked to have her thyroid checked by her naturopath, he feels she would benefit from some supplementation

## 2012-11-12 NOTE — Assessment & Plan Note (Signed)
Improved with significant changes in diet incluidng no wheat, sugar, dairy

## 2012-11-15 ENCOUNTER — Encounter: Payer: Self-pay | Admitting: Family Medicine

## 2012-11-16 ENCOUNTER — Telehealth: Payer: Self-pay

## 2012-11-16 MED ORDER — THYROID 15 MG PO TABS: 15.0000 mg | ORAL_TABLET | ORAL | Status: DC

## 2012-11-16 NOTE — Telephone Encounter (Signed)
RX sent and I will inform pt

## 2013-01-12 ENCOUNTER — Telehealth: Payer: Self-pay | Admitting: Family Medicine

## 2013-01-12 MED ORDER — THYROID 15 MG PO TABS: 15.0000 mg | ORAL_TABLET | ORAL | Status: DC

## 2013-01-12 NOTE — Telephone Encounter (Signed)
Left a message for pt to return my call. 

## 2013-01-12 NOTE — Telephone Encounter (Signed)
Please advise 

## 2013-01-12 NOTE — Telephone Encounter (Signed)
So we should recheck the same thyroid studies in mid March for now can have a refill on her thyroid med at same dose, same sig, #30 no refill til we recheck

## 2013-01-13 NOTE — Telephone Encounter (Signed)
Patient informed. 

## 2013-02-27 ENCOUNTER — Other Ambulatory Visit: Payer: Self-pay | Admitting: Family Medicine

## 2013-03-02 ENCOUNTER — Other Ambulatory Visit: Payer: Self-pay

## 2013-03-02 MED ORDER — THYROID 15 MG PO TABS: 15.0000 mg | ORAL_TABLET | ORAL | Status: DC

## 2013-03-04 NOTE — Telephone Encounter (Signed)
This request is done

## 2013-04-07 ENCOUNTER — Other Ambulatory Visit (INDEPENDENT_AMBULATORY_CARE_PROVIDER_SITE_OTHER): Payer: Medicare PPO

## 2013-04-07 DIAGNOSIS — E079 Disorder of thyroid, unspecified: Secondary | ICD-10-CM

## 2013-04-07 DIAGNOSIS — R030 Elevated blood-pressure reading, without diagnosis of hypertension: Secondary | ICD-10-CM

## 2013-04-07 DIAGNOSIS — Z0000 Encounter for general adult medical examination without abnormal findings: Secondary | ICD-10-CM

## 2013-04-07 DIAGNOSIS — E049 Nontoxic goiter, unspecified: Secondary | ICD-10-CM

## 2013-04-07 DIAGNOSIS — Z136 Encounter for screening for cardiovascular disorders: Secondary | ICD-10-CM

## 2013-04-07 DIAGNOSIS — C4490 Unspecified malignant neoplasm of skin, unspecified: Secondary | ICD-10-CM

## 2013-04-07 LAB — RENAL FUNCTION PANEL
Albumin: 3.9 g/dL (ref 3.5–5.2)
BUN: 14 mg/dL (ref 6–23)
CO2: 28 mEq/L (ref 19–32)
Calcium: 9.1 mg/dL (ref 8.4–10.5)
Chloride: 105 mEq/L (ref 96–112)
Creatinine, Ser: 0.9 mg/dL (ref 0.4–1.2)
GFR: 68.45 mL/min (ref >60.00)
Glucose, Bld: 76 mg/dL (ref 70–99)
Phosphorus: 4.1 mg/dL (ref 2.3–4.6)
Potassium: 4.2 mEq/L (ref 3.5–5.1)
Sodium: 139 mEq/L (ref 135–145)

## 2013-04-07 LAB — LIPID PANEL
Cholesterol: 177 mg/dL (ref 0–200)
HDL: 66.1 mg/dL (ref >39.00)
LDL Cholesterol: 97 mg/dL (ref 0–99)
Total CHOL/HDL Ratio: 3
Triglycerides: 69 mg/dL (ref 0.0–149.0)
VLDL: 13.8 mg/dL (ref 0.0–40.0)

## 2013-04-07 LAB — CBC
HCT: 36.3 % (ref 36.0–46.0)
Hemoglobin: 12.3 g/dL (ref 12.0–15.0)
MCHC: 33.9 g/dL (ref 30.0–36.0)
MCV: 90.3 fl (ref 78.0–100.0)
Platelets: 244 10*3/uL (ref 150.0–400.0)
RBC: 4.02 Mil/uL (ref 3.87–5.11)
RDW: 13.8 % (ref 11.5–14.6)
WBC: 4.3 10*3/uL — ABNORMAL LOW (ref 4.5–10.5)

## 2013-04-07 LAB — TSH: TSH: 2.98 u[IU]/mL (ref 0.35–5.50)

## 2013-04-07 LAB — HEPATIC FUNCTION PANEL
ALT: 16 U/L (ref 0–35)
AST: 27 U/L (ref 0–37)
Albumin: 3.9 g/dL (ref 3.5–5.2)
Alkaline Phosphatase: 61 U/L (ref 39–117)
Bilirubin, Direct: 0 mg/dL (ref 0.0–0.3)
Total Bilirubin: 0.8 mg/dL (ref 0.3–1.2)
Total Protein: 6.4 g/dL (ref 6.0–8.3)

## 2013-04-07 NOTE — Progress Notes (Signed)
Labs only

## 2013-04-14 ENCOUNTER — Ambulatory Visit (INDEPENDENT_AMBULATORY_CARE_PROVIDER_SITE_OTHER): Payer: Medicare PPO | Admitting: Family Medicine

## 2013-04-14 ENCOUNTER — Encounter: Payer: Self-pay | Admitting: Family Medicine

## 2013-04-14 ENCOUNTER — Encounter: Payer: Medicare Other | Admitting: Family Medicine

## 2013-04-14 VITALS — BP 94/62 | HR 73 | Temp 98.3°F | Ht 64.25 in | Wt 132.0 lb

## 2013-04-14 DIAGNOSIS — R030 Elevated blood-pressure reading, without diagnosis of hypertension: Secondary | ICD-10-CM

## 2013-04-14 DIAGNOSIS — E079 Disorder of thyroid, unspecified: Secondary | ICD-10-CM

## 2013-04-14 DIAGNOSIS — IMO0001 Reserved for inherently not codable concepts without codable children: Secondary | ICD-10-CM

## 2013-04-14 DIAGNOSIS — C4490 Unspecified malignant neoplasm of skin, unspecified: Secondary | ICD-10-CM

## 2013-04-14 DIAGNOSIS — Z0000 Encounter for general adult medical examination without abnormal findings: Secondary | ICD-10-CM

## 2013-04-14 DIAGNOSIS — E049 Nontoxic goiter, unspecified: Secondary | ICD-10-CM

## 2013-04-14 LAB — T4, FREE: Free T4: 1.07 ng/dL (ref 0.80–1.80)

## 2013-04-14 LAB — T3, FREE: T3, Free: 2.5 pg/mL (ref 2.3–4.2)

## 2013-04-14 MED ORDER — THYROID 15 MG PO TABS: 15.0000 mg | ORAL_TABLET | Freq: Every day | ORAL | Status: DC

## 2013-04-14 NOTE — Patient Instructions (Signed)
Next visit annual with labs prior tsh, freet4, free t3, lipid, renal, cbc, tsh, hepatic

## 2013-04-14 NOTE — Progress Notes (Signed)
Patient ID: Angela Buck, female   DOB: 04/15/1941, 72 y.o.   MRN: [id number] RICCI PAFF [id number] 01-06-1941 04/14/2013      Progress Note-Follow Up  Subjective  Chief Complaint  Chief Complaint  Patient presents with  . Annual Exam    physical    HPI  Patient is a 72 year old Caucasian female here for ongoing care. She is feeling well. She's recently traveled but does acknowledge returning with some mild congestion but feels it is improving. CT is persistent but not overwhelming. Some low-grade constipation he. It is slightly at times. Bloody or tarry stool. For nausea vomiting and anorexia. No fevers or chills no chest pain, palpitations, shortness of breath, GI or GU new concerns otherwise noted. Continues to follow with a naturopath in [place].  Past Medical History  Diagnosis Date  . Chicken pox as a child  . Measles as a child  . 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 History of mumps   . Colonic polyp 01/05/2012  . Skin cancer 12/02/2009  . Osteopenia 01/05/2012  . Preventative health care 01/05/2012  . Mass of axilla 01/05/2012  . Elevated BP 01/05/2012  . Abdominal pain 03/16/2012  . Fatigue 03/16/2012    Past Surgical History  Procedure Laterality Date  . Right hip replacement  2003  . Biopsy of jawline  2011  . Laproscopy  1970    abdominal, endometriosis, with D&C  . Tonsillectomy and adenoidectomy  1949  . Cataract extraction, bilateral      b/l    Family History  Problem Relation Age of Onset  . Arthritis Mother   . Hypertension Mother   . Dementia Mother   . Heart attack Mother   . Glaucoma Mother   . Heart disease Mother     MI, stent in 2004  . Emphysema Father     smoker  . Hypertension Father   . Glaucoma Father   . COPD Father   . Other Paternal Grandfather     enlarged heart  . Ulcers Maternal Grandmother     History   Social History  . Marital Status: Divorced    Spouse Name: N/A    Number of Children: N/A  . Years of  Education: N/A   Occupational History  . Not on file.   Social History Main Topics  . Smoking status: Never Smoker   . Smokeless tobacco: Never Used  . Alcohol Use: Yes     Comment: occasional wine  . Drug Use: No  . Sexually Active: Yes -- Female partner(s)   Other Topics Concern  . Not on file   Social History Narrative  . No narrative on file    Current Outpatient Prescriptions on File Prior to Visit  Medication Sig Dispense Refill  . OVER THE COUNTER MEDICATION LypoZyme 1 tablet tid      . OVER THE COUNTER MEDICATION Balance Zyme- 1 tablet tid      . OVER THE COUNTER MEDICATION Alka-C/ 1/2 tsp bid       No current facility-administered medications on file prior to visit.    Allergies  Allergen Reactions  . Bee Venom   . Penicillins Hives    Review of Systems  ROS  Objective  BP 94/62  Pulse 73  Temp(Src) 98.3 F (36.8 C) (Oral)  Ht 5' 4.25" (1.632 m)  Wt 132 lb 0.6 oz (59.893 kg)  BMI 22.49 kg/m2  SpO2 97%  Physical Exam  Physical Exam  Constitutional: She is oriented to person, place, and time and well-developed, well-nourished, and in no distress. No distress.  HENT:  Head: Normocephalic and atraumatic.  Right Ear: External ear normal.  Left Ear: External ear normal.  Nose: Nose normal.  Mouth/Throat: Oropharynx is clear and moist. No oropharyngeal exudate.  Eyes: Conjunctivae are normal. Pupils are equal, round, and reactive to light. Right eye exhibits no discharge. Left eye exhibits no discharge. No scleral icterus.  Neck: Normal range of motion. Neck supple. No thyromegaly present.  Cardiovascular: Normal rate, regular rhythm, normal heart sounds and intact distal pulses.   No murmur heard. Pulmonary/Chest: Effort normal and breath sounds normal. No respiratory distress. She has no wheezes. She has no rales.  Abdominal: Soft. Bowel sounds are normal. She exhibits no distension and no mass. There is no tenderness.  Musculoskeletal: Normal  range of motion. She exhibits no edema and no tenderness.  Lymphadenopathy:    She has no cervical adenopathy.  Neurological: She is alert and oriented to person, place, and time. She has normal reflexes. No cranial nerve deficit. Coordination normal.  Skin: Skin is warm and dry. No rash noted. She is not diaphoretic.  Psychiatric: Mood, memory and affect normal.    Lab Results  Component Value Date   TSH 2.98 04/07/2013   Lab Results  Component Value Date   WBC 4.3* 04/07/2013   HGB 12.3 04/07/2013   HCT 36.3 04/07/2013   MCV 90.3 04/07/2013   PLT 244.0 04/07/2013   Lab Results  Component Value Date   CREATININE 0.9 04/07/2013   BUN 14 04/07/2013   NA 139 04/07/2013   K 4.2 04/07/2013   CL 105 04/07/2013   CO2 28 04/07/2013   Lab Results  Component Value Date   ALT 16 04/07/2013   AST 27 04/07/2013   ALKPHOS 61 04/07/2013   BILITOT 0.8 04/07/2013   Lab Results  Component Value Date   CHOL 177 04/07/2013   Lab Results  Component Value Date   HDL 66.10 04/07/2013   Lab Results  Component Value Date   LDLCALC 97 04/07/2013   Lab Results  Component Value Date   TRIG 69.0 04/07/2013   Lab Results  Component Value Date   CHOLHDL 3 04/07/2013     Assessment & Plan  Preventative health care Patient maintains a heart healthy diet and is very active. Encouraged to add some weight bearing on upper extremities.   Elevated BP Actually slightly low today, asymptomatic  Enlarged thyroid Has been on Armour thyroids at her request, is taking it daily, thyroid studies wnl.  Skin cancer No newly concerning lesions.

## 2013-04-15 ENCOUNTER — Telehealth: Payer: Self-pay | Admitting: Family Medicine

## 2013-04-15 NOTE — Telephone Encounter (Signed)
OK 

## 2013-04-15 NOTE — Telephone Encounter (Signed)
Patient called in stating that she would like to talk to [facility] regarding her visit yesterday. Patient would not give me details about this.

## 2013-04-15 NOTE — Telephone Encounter (Signed)
FYI:Pt would like all of the over the counter medication off of her list?

## 2013-04-16 NOTE — Assessment & Plan Note (Signed)
No newly concerning lesions.

## 2013-04-16 NOTE — Assessment & Plan Note (Signed)
Actually slightly low today, asymptomatic

## 2013-04-16 NOTE — Assessment & Plan Note (Signed)
Patient maintains a heart healthy diet and is very active. Encouraged to add some weight bearing on upper extremities.

## 2013-04-16 NOTE — Assessment & Plan Note (Signed)
Has been on Armour thyroids at her request, is taking it daily, thyroid studies wnl.

## 2013-07-07 ENCOUNTER — Other Ambulatory Visit: Payer: Self-pay

## 2013-10-07 ENCOUNTER — Other Ambulatory Visit: Payer: Self-pay

## 2013-10-12 ENCOUNTER — Other Ambulatory Visit: Payer: Self-pay | Admitting: Family Medicine

## 2013-11-12 ENCOUNTER — Encounter: Payer: Self-pay | Admitting: Family Medicine

## 2013-12-17 ENCOUNTER — Telehealth: Payer: Self-pay | Admitting: Family Medicine

## 2013-12-17 MED ORDER — THYROID 15 MG PO TABS: 15.0000 mg | ORAL_TABLET | Freq: Every day | ORAL | Status: DC

## 2013-12-17 NOTE — Telephone Encounter (Signed)
refill-armour thyroid 15mg  tablet. Take one tablet by mouth every day. Qty 30. Last fill 10.15.14

## 2014-01-13 ENCOUNTER — Telehealth: Payer: Self-pay | Admitting: Family Medicine

## 2014-01-13 NOTE — Telephone Encounter (Signed)
Received PA form for Armour Thyroid 15mg , form forward to nurse

## 2014-01-14 ENCOUNTER — Telehealth: Payer: Self-pay | Admitting: Family Medicine

## 2014-01-14 NOTE — Telephone Encounter (Signed)
FORM AND LAST VISIT FOR PA ON ARMOUR THYROID SENT TO DR B

## 2014-01-20 NOTE — Telephone Encounter (Signed)
Duplicate msg. Closing this encounter...Angela Buck

## 2014-02-11 ENCOUNTER — Encounter: Payer: Self-pay | Admitting: Family Medicine

## 2014-03-18 ENCOUNTER — Encounter: Payer: Self-pay | Admitting: Family Medicine

## 2014-03-18 NOTE — Telephone Encounter (Signed)
Yes, I have forward paperwork back to nurse

## 2014-03-18 NOTE — Telephone Encounter (Signed)
Do you have this paperwork still?

## 2014-03-21 ENCOUNTER — Telehealth: Payer: Self-pay

## 2014-03-21 NOTE — Telephone Encounter (Signed)
PA form sent again

## 2014-03-23 NOTE — Telephone Encounter (Signed)
Received approval from [facility] for pt's armour thyroid 15mg  good through 12/01/14.  Faxed approval to [facility] and left detailed message on pt's home # and to call if any questions.

## 2014-04-26 ENCOUNTER — Telehealth: Payer: Self-pay | Admitting: Family Medicine

## 2014-04-26 DIAGNOSIS — IMO0001 Reserved for inherently not codable concepts without codable children: Secondary | ICD-10-CM

## 2014-04-26 DIAGNOSIS — R030 Elevated blood-pressure reading, without diagnosis of hypertension: Principal | ICD-10-CM

## 2014-04-26 DIAGNOSIS — E785 Hyperlipidemia, unspecified: Secondary | ICD-10-CM

## 2014-04-26 DIAGNOSIS — E079 Disorder of thyroid, unspecified: Secondary | ICD-10-CM

## 2014-04-26 NOTE — Telephone Encounter (Signed)
Sorry it has been a year. Please advise Dr Charlett Blake if you want pt to do TSH before appt or wait until appt?

## 2014-04-26 NOTE — Telephone Encounter (Signed)
Informed patient of this and she is questioning the dates of the last labs for thyroid. She states that it was 04/07/13 and that its been over a year. She wants to know if insurance would cover since its been over a year? Thanks.

## 2014-04-26 NOTE — Telephone Encounter (Signed)
Needs a free T4, free T3 and TSH but would also a full set of physical labs is in order liver, renal, cbc, lipids. For hyperlipidemia and thyroid disease.

## 2014-04-26 NOTE — Telephone Encounter (Signed)
Patient has an appointment on 05/13/14. She wants her thyroid checked and wants to know if she should come in a week prior for labs?

## 2014-04-26 NOTE — Telephone Encounter (Signed)
Advise pt that she had a TSH on 04-07-13 and T3 and T4 checked on 04-14-13. Most insurances are not going to cover this that soon. Pt can discuss with md at appt

## 2014-04-27 NOTE — Telephone Encounter (Signed)
Please advise pt that she can do labs prior to appt.   Was a confusion with 14/15.

## 2014-04-27 NOTE — Telephone Encounter (Signed)
Left detailed message informing patient of this. °

## 2014-05-04 ENCOUNTER — Other Ambulatory Visit (INDEPENDENT_AMBULATORY_CARE_PROVIDER_SITE_OTHER): Payer: Medicare PPO

## 2014-05-04 DIAGNOSIS — E079 Disorder of thyroid, unspecified: Secondary | ICD-10-CM

## 2014-05-04 DIAGNOSIS — I10 Essential (primary) hypertension: Secondary | ICD-10-CM

## 2014-05-04 DIAGNOSIS — E785 Hyperlipidemia, unspecified: Secondary | ICD-10-CM

## 2014-05-04 LAB — HEPATIC FUNCTION PANEL
ALT: 18 U/L (ref 0–35)
AST: 28 U/L (ref 0–37)
Albumin: 3.8 g/dL (ref 3.5–5.2)
Alkaline Phosphatase: 59 U/L (ref 39–117)
Bilirubin, Direct: 0.1 mg/dL (ref 0.0–0.3)
Total Bilirubin: 0.7 mg/dL (ref 0.2–1.2)
Total Protein: 6.1 g/dL (ref 6.0–8.3)

## 2014-05-04 LAB — CBC WITH DIFFERENTIAL/PLATELET
Basophils Absolute: 0 10*3/uL (ref 0.0–0.1)
Basophils Relative: 0.5 % (ref 0.0–3.0)
Eosinophils Absolute: 0.1 10*3/uL (ref 0.0–0.7)
Eosinophils Relative: 2.6 % (ref 0.0–5.0)
HCT: 37.7 % (ref 36.0–46.0)
Hemoglobin: 12.4 g/dL (ref 12.0–15.0)
Lymphocytes Relative: 35.6 % (ref 12.0–46.0)
Lymphs Abs: 1.4 10*3/uL (ref 0.7–4.0)
MCHC: 32.9 g/dL (ref 30.0–36.0)
MCV: 93.6 fl (ref 78.0–100.0)
Monocytes Absolute: 0.3 10*3/uL (ref 0.1–1.0)
Monocytes Relative: 7.1 % (ref 3.0–12.0)
Neutro Abs: 2.1 10*3/uL (ref 1.4–7.7)
Neutrophils Relative %: 54.2 % (ref 43.0–77.0)
Platelets: 249 10*3/uL (ref 150.0–400.0)
RBC: 4.03 Mil/uL (ref 3.87–5.11)
RDW: 13.5 % (ref 11.5–15.5)
WBC: 3.9 10*3/uL — ABNORMAL LOW (ref 4.0–10.5)

## 2014-05-04 LAB — RENAL FUNCTION PANEL
Albumin: 3.8 g/dL (ref 3.5–5.2)
BUN: 14 mg/dL (ref 6–23)
CO2: 28 mEq/L (ref 19–32)
Calcium: 9.3 mg/dL (ref 8.4–10.5)
Chloride: 106 mEq/L (ref 96–112)
Creatinine, Ser: 0.9 mg/dL (ref 0.4–1.2)
GFR: 69.16 mL/min (ref >60.00)
Glucose, Bld: 87 mg/dL (ref 70–99)
Phosphorus: 3.5 mg/dL (ref 2.3–4.6)
Potassium: 4.3 mEq/L (ref 3.5–5.1)
Sodium: 139 mEq/L (ref 135–145)

## 2014-05-04 LAB — LIPID PANEL
Cholesterol: 196 mg/dL (ref 0–200)
HDL: 70 mg/dL (ref >39.00)
LDL Cholesterol: 116 mg/dL — ABNORMAL HIGH (ref 0–99)
NonHDL: 126
Total CHOL/HDL Ratio: 3
Triglycerides: 48 mg/dL (ref 0.0–149.0)
VLDL: 9.6 mg/dL (ref 0.0–40.0)

## 2014-05-04 LAB — TSH: TSH: 4.47 u[IU]/mL (ref 0.35–4.50)

## 2014-05-04 LAB — T3, FREE: T3, Free: 2.8 pg/mL (ref 2.3–4.2)

## 2014-05-04 LAB — T4, FREE: Free T4: 0.65 ng/dL (ref 0.60–1.60)

## 2014-05-11 ENCOUNTER — Encounter: Payer: Self-pay | Admitting: Family Medicine

## 2014-05-11 ENCOUNTER — Ambulatory Visit (INDEPENDENT_AMBULATORY_CARE_PROVIDER_SITE_OTHER): Payer: Medicare PPO | Admitting: Family Medicine

## 2014-05-11 VITALS — BP 100/52 | HR 59 | Temp 97.9°F | Ht 64.25 in | Wt 135.0 lb

## 2014-05-11 DIAGNOSIS — R030 Elevated blood-pressure reading, without diagnosis of hypertension: Secondary | ICD-10-CM

## 2014-05-11 DIAGNOSIS — E785 Hyperlipidemia, unspecified: Secondary | ICD-10-CM

## 2014-05-11 DIAGNOSIS — M8580 Other specified disorders of bone density and structure, unspecified site: Secondary | ICD-10-CM

## 2014-05-11 DIAGNOSIS — M899 Disorder of bone, unspecified: Secondary | ICD-10-CM

## 2014-05-11 DIAGNOSIS — M949 Disorder of cartilage, unspecified: Secondary | ICD-10-CM

## 2014-05-11 DIAGNOSIS — E079 Disorder of thyroid, unspecified: Secondary | ICD-10-CM

## 2014-05-11 DIAGNOSIS — IMO0001 Reserved for inherently not codable concepts without codable children: Secondary | ICD-10-CM

## 2014-05-11 DIAGNOSIS — D72819 Decreased white blood cell count, unspecified: Secondary | ICD-10-CM

## 2014-05-11 NOTE — Progress Notes (Signed)
Pre visit review using our clinic review tool, if applicable. No additional management support is needed unless otherwise documented below in the visit note. 

## 2014-05-11 NOTE — Patient Instructions (Signed)
Free T4, Free T3   Lyme Disease You may have been bitten by a tick and are to watch for the development of Lyme Disease. Lyme Disease is an infection that is caused by a bacteria The bacteria causing this disease is named Borreilia burgdorferi. If a tick is infected with this bacteria and then bites you, then Lyme Disease may occur. These ticks are carried by deer and rodents such as rabbits and mice and infest grassy as well as forested areas. Fortunately most tick bites do not cause Lyme Disease.  Lyme Disease is easier to prevent than to treat. First, covering your legs with clothing when walking in areas where ticks are possibly abundant will prevent their attachment because ticks tend to stay within inches of the ground. Second, using insecticides containing DEET can be applied on skin or clothing. Last, because it takes about 12 to 24 hours for the tick to transmit the disease after attachment to the human host, you should inspect your body for ticks twice a day when you are in areas where Lyme Disease is common. You must look thoroughly when searching for ticks. The Ixodes tick that carries Lyme Disease is very small. It is around the size of a sesame seed (picture of tick is not actual size). Removal is best done by grasping the tick by the head and pulling it out. Do not to squeeze the body of the tick. This could inject the infecting bacteria into the bite site. Wash the area of the bite with an antiseptic solution after removal.  Lyme Disease is a disease that may affect many body systems. Because of the small size of the biting tick, most people do not notice being bitten. The first sign of an infection is usually a round red rash that extends out from the center of the tick bite. The center of the lesion may be blood colored (hemorrhagic) or have tiny blisters (vesicular). Most lesions have bright red outer borders and partial central clearing. This rash may extend out many inches in diameter, and  multiple lesions may be present. Other symptoms such as fatigue, headaches, chills and fever, general achiness and swelling of lymph glands may also occur. If this first stage of the disease is left untreated, these symptoms may gradually resolve by themselves, or progressive symptoms may occur because of spread of infection to other areas of the body.  Follow up with your caregiver to have testing and treatment if you have a tick bite and you develop any of the above complaints. Your caregiver may recommend preventative (prophylactic) medications which kill bacteria (antibiotics). Once a diagnosis of Lyme Disease is made, antibiotic treatment is highly likely to cure the disease. Effective treatment of late stage Lyme Disease may require longer courses of antibiotic therapy.  MAKE SURE YOU:   Understand these instructions.  Will watch your condition.  Will get help right away if you are not doing well or get worse. Document Released: 02/24/2001 Document Revised: 02/10/2012 Document Reviewed: 04/28/2009 [facility] Patient Information 2014 [place], [place].

## 2014-05-13 ENCOUNTER — Ambulatory Visit: Payer: Medicare PPO | Admitting: Family Medicine

## 2014-05-18 ENCOUNTER — Encounter: Payer: Self-pay | Admitting: Family Medicine

## 2014-05-18 DIAGNOSIS — D72819 Decreased white blood cell count, unspecified: Secondary | ICD-10-CM

## 2014-05-18 DIAGNOSIS — E785 Hyperlipidemia, unspecified: Secondary | ICD-10-CM | POA: Insufficient documentation

## 2014-05-18 HISTORY — DX: Decreased white blood cell count, unspecified: D72.819

## 2014-05-18 HISTORY — DX: Hyperlipidemia, unspecified: E78.5

## 2014-05-18 NOTE — Assessment & Plan Note (Signed)
Stable on current dose of Armour Thyroid, no changes

## 2014-05-18 NOTE — Assessment & Plan Note (Signed)
Mild, asymptomatic will monitor °

## 2014-05-18 NOTE — Progress Notes (Signed)
Patient ID: Angela Buck, female   DOB: [birth year], 73 y.o.   MRN: [id number] Angela Buck [id number] 1941-07-27 05/18/2014      Progress Note-Follow Up  Subjective  Chief Complaint  Chief Complaint  Patient presents with  . Follow-up    discuss thyroid lab results    HPI  Patient is a 73 year old female in today for routine medical care. She is in today for followup. She is feeling fairly well. No recent illness. Does have some persistent fatigue but it is manageable. Did have a tick bite recently but it was not on for [place] 24 hours. She did not have any headache or fevers. She does not have any rash. Denies CP/palp/SOB/HA/congestion/fevers/GI or GU c/o. Taking meds as prescribed  Past Medical History  Diagnosis Date  . Chicken pox as a child  . Measles as a child  . 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 History of mumps   . Colonic polyp 01/05/2012  . Skin cancer 12/02/2009  . Osteopenia 01/05/2012  . Preventative health care 01/05/2012  . Mass of axilla 01/05/2012  . Elevated BP 01/05/2012  . Abdominal pain 03/16/2012  . Fatigue 03/16/2012  . Thyroid disease 03/16/2012    right   . Other and unspecified hyperlipidemia 05/18/2014  . Leukopenia 05/18/2014    Past Surgical History  Procedure Laterality Date  . Right hip replacement  2003  . Biopsy of jawline  2011  . Laproscopy  1970    abdominal, endometriosis, with D&C  . Tonsillectomy and adenoidectomy  1949  . Cataract extraction, bilateral      b/l    Family History  Problem Relation Age of Onset  . Arthritis Mother   . Hypertension Mother   . Dementia Mother   . Heart attack Mother   . Glaucoma Mother   . Heart disease Mother     MI, stent in 2004  . Emphysema Father     smoker  . Hypertension Father   . Glaucoma Father   . COPD Father   . Other Paternal Grandfather     enlarged heart  . Ulcers Maternal Grandmother     History   Social History  . Marital Status: Divorced    Spouse  Name: N/A    Number of Children: N/A  . Years of Education: N/A   Occupational History  . Not on file.   Social History Main Topics  . Smoking status: Never Smoker   . Smokeless tobacco: Never Used  . Alcohol Use: Yes     Comment: occasional wine  . Drug Use: No  . Sexual Activity: Yes    Partners: Female   Other Topics Concern  . Not on file   Social History Narrative  . No narrative on file    Current Outpatient Prescriptions on File Prior to Visit  Medication Sig Dispense Refill  . Ascorbic Acid (VITAMIN C) 1000 MG tablet Take 1,000 mg by mouth 2 (two) times daily.       No current facility-administered medications on file prior to visit.    Allergies  Allergen Reactions  . Bee Venom   . Penicillins Hives    Review of Systems  Review of Systems  Constitutional: Positive for malaise/fatigue. Negative for fever.  HENT: Negative for congestion.   Eyes: Negative for discharge.  Respiratory: Negative for shortness of breath.   Cardiovascular: Negative for chest pain, palpitations and leg swelling.  Gastrointestinal: Negative for nausea,  abdominal pain and diarrhea.  Genitourinary: Negative for dysuria.  Musculoskeletal: Negative for falls.  Skin: Negative for rash.  Neurological: Negative for loss of consciousness and headaches.  Endo/Heme/Allergies: Negative for polydipsia.  Psychiatric/Behavioral: Negative for depression and suicidal ideas. The patient is not nervous/anxious and does not have insomnia.     Objective  BP 100/52  Pulse 59  Temp(Src) 97.9 F (36.6 C) (Oral)  Ht 5' 4.25" (1.632 m)  Wt 135 lb 0.6 oz (61.254 kg)  BMI 23.00 kg/m2  SpO2 97%  Physical Exam  Physical Exam  Constitutional: She is oriented to person, place, and time and well-developed, well-nourished, and in no distress. No distress.  HENT:  Head: Normocephalic and atraumatic.  Eyes: Conjunctivae are normal.  Neck: Neck supple. No thyromegaly present.  Cardiovascular:  Normal rate, regular rhythm and normal heart sounds.   No murmur heard. Pulmonary/Chest: Effort normal and breath sounds normal. She has no wheezes.  Abdominal: She exhibits no distension and no mass.  Musculoskeletal: She exhibits no edema.  Lymphadenopathy:    She has no cervical adenopathy.  Neurological: She is alert and oriented to person, place, and time.  Skin: Skin is warm and dry. No rash noted. She is not diaphoretic.  Psychiatric: Memory, affect and judgment normal.    Lab Results  Component Value Date   TSH 4.47 05/04/2014   Lab Results  Component Value Date   WBC 3.9* 05/04/2014   HGB 12.4 05/04/2014   HCT 37.7 05/04/2014   MCV 93.6 05/04/2014   PLT 249.0 05/04/2014   Lab Results  Component Value Date   CREATININE 0.9 05/04/2014   BUN 14 05/04/2014   NA 139 05/04/2014   K 4.3 05/04/2014   CL 106 05/04/2014   CO2 28 05/04/2014   Lab Results  Component Value Date   ALT 18 05/04/2014   AST 28 05/04/2014   ALKPHOS 59 05/04/2014   BILITOT 0.7 05/04/2014   Lab Results  Component Value Date   CHOL 196 05/04/2014   Lab Results  Component Value Date   HDL 70.00 05/04/2014   Lab Results  Component Value Date   LDLCALC 116* 05/04/2014   Lab Results  Component Value Date   TRIG 48.0 05/04/2014   Lab Results  Component Value Date   CHOLHDL 3 05/04/2014     Assessment & Plan  Elevated BP Well controlled, no changes. Encouraged heart healthy diet such as the DASH diet and exercise as tolerated  Osteopenia Maintain adequate exercise, continue calcium and vitamin D intake  Thyroid disease Stable on current dose of Armour Thyroid, no changes  Leukopenia Mild, asymptomatic will monitor  Other and unspecified hyperlipidemia Mild, Encouraged heart healthy diet, increase exercise, avoid trans fats, consider a krill oil cap daily

## 2014-05-18 NOTE — Assessment & Plan Note (Signed)
Well controlled, no changes. Encouraged heart healthy diet such as the DASH diet and exercise as tolerated.  

## 2014-05-18 NOTE — Assessment & Plan Note (Signed)
Maintain adequate exercise, continue calcium and vitamin D intake

## 2014-05-18 NOTE — Assessment & Plan Note (Signed)
Mild, Encouraged heart healthy diet, increase exercise, avoid trans fats, consider a krill oil cap daily 

## 2014-06-09 ENCOUNTER — Other Ambulatory Visit: Payer: Self-pay | Admitting: Family Medicine

## 2014-07-27 ENCOUNTER — Encounter: Payer: Self-pay | Admitting: Family Medicine

## 2014-07-27 DIAGNOSIS — E785 Hyperlipidemia, unspecified: Secondary | ICD-10-CM

## 2014-07-27 DIAGNOSIS — E079 Disorder of thyroid, unspecified: Secondary | ICD-10-CM

## 2014-07-28 MED ORDER — THYROID 15 MG PO TABS: 15.0000 mg | ORAL_TABLET | Freq: Every day | ORAL | Status: DC

## 2014-07-28 NOTE — Telephone Encounter (Signed)
Please advise if patient needs to come in for labs before appt and if so which labs and diagnosis?  I handled the other questions

## 2014-07-29 NOTE — Addendum Note (Signed)
Addended by: Varney Daily on: 07/29/2014 09:43 AM   Modules accepted: Orders

## 2014-08-21 ENCOUNTER — Other Ambulatory Visit: Payer: Self-pay | Admitting: Family Medicine

## 2014-09-15 ENCOUNTER — Encounter: Payer: Self-pay | Admitting: Family Medicine

## 2014-09-15 ENCOUNTER — Ambulatory Visit (INDEPENDENT_AMBULATORY_CARE_PROVIDER_SITE_OTHER): Payer: Medicare PPO | Admitting: Family Medicine

## 2014-09-15 VITALS — BP 123/75 | HR 67 | Temp 98.0°F | Ht 64.25 in | Wt 127.0 lb

## 2014-09-15 DIAGNOSIS — E079 Disorder of thyroid, unspecified: Secondary | ICD-10-CM

## 2014-09-15 DIAGNOSIS — IMO0001 Reserved for inherently not codable concepts without codable children: Secondary | ICD-10-CM

## 2014-09-15 DIAGNOSIS — D72819 Decreased white blood cell count, unspecified: Secondary | ICD-10-CM

## 2014-09-15 DIAGNOSIS — E785 Hyperlipidemia, unspecified: Secondary | ICD-10-CM

## 2014-09-15 DIAGNOSIS — E039 Hypothyroidism, unspecified: Secondary | ICD-10-CM

## 2014-09-15 DIAGNOSIS — R030 Elevated blood-pressure reading, without diagnosis of hypertension: Secondary | ICD-10-CM

## 2014-09-15 DIAGNOSIS — Z0000 Encounter for general adult medical examination without abnormal findings: Secondary | ICD-10-CM

## 2014-09-15 LAB — HEPATIC FUNCTION PANEL
ALT: 18 U/L (ref 0–35)
AST: 28 U/L (ref 0–37)
Albumin: 3.6 g/dL (ref 3.5–5.2)
Alkaline Phosphatase: 60 U/L (ref 39–117)
Bilirubin, Direct: 0 mg/dL (ref 0.0–0.3)
Total Bilirubin: 0.7 mg/dL (ref 0.2–1.2)
Total Protein: 7 g/dL (ref 6.0–8.3)

## 2014-09-15 LAB — RENAL FUNCTION PANEL
Albumin: 3.6 g/dL (ref 3.5–5.2)
BUN: 16 mg/dL (ref 6–23)
CO2: 24 mEq/L (ref 19–32)
Calcium: 9.4 mg/dL (ref 8.4–10.5)
Chloride: 104 mEq/L (ref 96–112)
Creatinine, Ser: 1 mg/dL (ref 0.4–1.2)
GFR: 58.05 mL/min — ABNORMAL LOW (ref >60.00)
Glucose, Bld: 88 mg/dL (ref 70–99)
Phosphorus: 3.3 mg/dL (ref 2.3–4.6)
Potassium: 4 mEq/L (ref 3.5–5.1)
Sodium: 140 mEq/L (ref 135–145)

## 2014-09-15 LAB — CBC
HCT: 39 % (ref 36.0–46.0)
Hemoglobin: 12.7 g/dL (ref 12.0–15.0)
MCHC: 32.5 g/dL (ref 30.0–36.0)
MCV: 92.9 fl (ref 78.0–100.0)
Platelets: 252 10*3/uL (ref 150.0–400.0)
RBC: 4.2 Mil/uL (ref 3.87–5.11)
RDW: 13.4 % (ref 11.5–15.5)
WBC: 6 10*3/uL (ref 4.0–10.5)

## 2014-09-15 LAB — TSH: TSH: 2.63 u[IU]/mL (ref 0.35–4.50)

## 2014-09-15 LAB — LIPID PANEL
Cholesterol: 196 mg/dL (ref 0–200)
HDL: 61.4 mg/dL (ref >39.00)
LDL Cholesterol: 122 mg/dL — ABNORMAL HIGH (ref 0–99)
NonHDL: 134.6
Total CHOL/HDL Ratio: 3
Triglycerides: 65 mg/dL (ref 0.0–149.0)
VLDL: 13 mg/dL (ref 0.0–40.0)

## 2014-09-15 LAB — T3, FREE: T3, Free: 2.8 pg/mL (ref 2.3–4.2)

## 2014-09-15 LAB — T4, FREE: Free T4: 0.89 ng/dL (ref 0.60–1.60)

## 2014-09-15 NOTE — Progress Notes (Signed)
Pre visit review using our clinic review tool, if applicable. No additional management support is needed unless otherwise documented below in the visit note. 

## 2014-09-15 NOTE — Patient Instructions (Signed)
Encouraged increased hydration and fiber in diet. Daily probiotics. If bowels not moving can use MOM 2 tbls po in 4 oz of warm prune juice by mouth every 2-3 days. If no results then repeat in 4 hours with  Dulcolax suppository pr, may repeat again in 4 more hours as needed. Seek care if symptoms worsen. Consider daily Miralax and/or Dulcolax if symptoms persist.    Hypothyroidism The thyroid is a large gland located in the lower front of your neck. The thyroid gland helps control metabolism. Metabolism is how your body handles food. It controls metabolism with the hormone thyroxine. When this gland is underactive (hypothyroid), it produces too little hormone.  CAUSES These include:   Absence or destruction of thyroid tissue.  Goiter due to iodine deficiency.  Goiter due to medications.  Congenital defects (since birth).  Problems with the pituitary. This causes a lack of TSH (thyroid stimulating hormone). This hormone tells the thyroid to turn out more hormone. SYMPTOMS  Lethargy (feeling as though you have no energy)  Cold intolerance  Weight gain (in spite of normal food intake)  Dry skin  Coarse hair  Menstrual irregularity (if severe, may lead to infertility)  Slowing of thought processes Cardiac problems are also caused by insufficient amounts of thyroid hormone. Hypothyroidism in the newborn is cretinism, and is an extreme form. It is important that this form be treated adequately and immediately or it will lead rapidly to retarded physical and mental development. DIAGNOSIS  To prove hypothyroidism, your caregiver may do blood tests and ultrasound tests. Sometimes the signs are hidden. It may be necessary for your caregiver to watch this illness with blood tests either before or after diagnosis and treatment. TREATMENT  Low levels of thyroid hormone are increased by using synthetic thyroid hormone. This is a safe, effective treatment. It usually takes about four weeks to  gain the full effects of the medication. After you have the full effect of the medication, it will generally take another four weeks for problems to leave. Your caregiver may start you on low doses. If you have had heart problems the dose may be gradually increased. It is generally not an emergency to get rapidly to normal. HOME CARE INSTRUCTIONS   Take your medications as your caregiver suggests. Let your caregiver know of any medications you are taking or start taking. Your caregiver will help you with dosage schedules.  As your condition improves, your dosage needs may increase. It will be necessary to have continuing blood tests as suggested by your caregiver.  Report all suspected medication side effects to your caregiver. SEEK MEDICAL CARE IF: Seek medical care if you develop:  Sweating.  Tremulousness (tremors).  Anxiety.  Rapid weight loss.  Heat intolerance.  Emotional swings.  Diarrhea.  Weakness. SEEK IMMEDIATE MEDICAL CARE IF:  You develop chest pain, an irregular heart beat (palpitations), or a rapid heart beat. MAKE SURE YOU:   Understand these instructions.  Will watch your condition.  Will get help right away if you are not doing well or get worse. Document Released: 11/18/2005 Document Revised: 02/10/2012 Document Reviewed: 07/08/2008 [facility] Patient Information 2015 [place], [place]. This information is not intended to replace advice given to you by your health care provider. Make sure you discuss any questions you have with your health care provider.

## 2014-09-18 ENCOUNTER — Encounter: Payer: Self-pay | Admitting: Family Medicine

## 2014-09-18 NOTE — Assessment & Plan Note (Signed)
Encouraged heart healthy diet, increase exercise, avoid trans fats, consider a krill oil cap daily 

## 2014-09-18 NOTE — Assessment & Plan Note (Signed)
Tolerating Armour Thyroid no changes today

## 2014-09-18 NOTE — Assessment & Plan Note (Signed)
Patient denies any difficulties at home. No trouble with ADLs, depression or falls. No recent changes to vision or hearing. Is UTD with immunizations. Is UTD with screening. Discussed Advanced Directives, patient agrees to bring [place] copies of documents if can. Encouraged heart healthy diet, exercise as tolerated and adequate sleep. Sees Dr Collene Mares of Gastroenterology Follows with a naturopath in [place] as well

## 2014-09-18 NOTE — Assessment & Plan Note (Signed)
Resolved with recent blood draw 

## 2014-09-18 NOTE — Assessment & Plan Note (Signed)
Well controlled, no changes to meds. Encouraged heart healthy diet such as the DASH diet and exercise as tolerated.  °

## 2014-09-18 NOTE — Progress Notes (Signed)
Patient ID: Angela Buck, female   DOB: 10-02-1941, 73 y.o.   MRN: [id number] TINSLEE KLARE [id number] [birth year] 09/18/2014      Progress Note-Follow Up  Subjective  Chief Complaint  Chief Complaint  Patient presents with  . Annual Exam    medicare wellness    HPI  Patient is a 73 year old female in today for routine medical care. In today for exam doing fairly well. Does acknowledge some fatigue but no other acute complaints. Continues to follow with a natural path and she notes that her last exam showed good improvement in her thyroid function. She does note some persistent fatigue but denies any recent illness. Denies CP/palp/SOB/HA/congestion/fevers/GI or GU c/o. Taking meds as prescribed  Past Medical History  Diagnosis Date  . Chicken pox as a child  . Measles as a child  . 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 History of mumps   . Colonic polyp 01/05/2012  . Skin cancer 12/02/2009  . Osteopenia 01/05/2012  . Preventative health care 01/05/2012  . Mass of axilla 01/05/2012  . Elevated BP 01/05/2012  . Abdominal pain 03/16/2012  . Fatigue 03/16/2012  . Thyroid disease 03/16/2012    right   . Other and unspecified hyperlipidemia 05/18/2014  . Leukopenia 05/18/2014  . Medicare annual wellness visit, subsequent 01/05/2012    Sees Dr Collene Mares of Gastroenterology     Past Surgical History  Procedure Laterality Date  . Right hip replacement  2003  . Biopsy of jawline  2011  . Laproscopy  1970    abdominal, endometriosis, with D&C  . Tonsillectomy and adenoidectomy  1949  . Cataract extraction, bilateral      b/l    Family History  Problem Relation Age of Onset  . Arthritis Mother   . Hypertension Mother   . Dementia Mother   . Heart attack Mother   . Glaucoma Mother   . Heart disease Mother     MI, stent in 2004  . Emphysema Father     smoker  . Hypertension Father   . Glaucoma Father   . COPD Father   . Other Paternal Grandfather     enlarged heart  .  Ulcers Maternal Grandmother     History   Social History  . Marital Status: Divorced    Spouse Name: N/A    Number of Children: N/A  . Years of Education: N/A   Occupational History  . Not on file.   Social History Main Topics  . Smoking status: Never Smoker   . Smokeless tobacco: Never Used  . Alcohol Use: Yes     Comment: occasional wine  . Drug Use: No  . Sexual Activity: Yes    Partners: Female   Other Topics Concern  . Not on file   Social History Narrative  . No narrative on file    Current Outpatient Prescriptions on File Prior to Visit  Medication Sig Dispense Refill  . ARMOUR THYROID 15 MG tablet TAKE 1 TABLET BY MOUTH DAILY  30 tablet  1  . Ascorbic Acid (VITAMIN C) 1000 MG tablet Take 1,000 mg by mouth 2 (two) times daily.      Marland Kitchen EPINEPHrine 0.3 mg/0.3 mL IJ SOAJ injection       . NONFORMULARY OR COMPOUNDED ITEM Ioderal- 5 days a week       No current facility-administered medications on file prior to visit.    Allergies  Allergen Reactions  .  Bee Venom   . Penicillins Hives    Review of Systems  Review of Systems  Constitutional: Negative for fever and malaise/fatigue.  HENT: Negative for congestion.   Eyes: Negative for discharge.  Respiratory: Negative for shortness of breath.   Cardiovascular: Negative for chest pain, palpitations and leg swelling.  Gastrointestinal: Negative for nausea, abdominal pain and diarrhea.  Genitourinary: Negative for dysuria.  Musculoskeletal: Negative for falls.  Skin: Negative for rash.  Neurological: Negative for loss of consciousness and headaches.  Endo/Heme/Allergies: Negative for polydipsia.  Psychiatric/Behavioral: Negative for depression and suicidal ideas. The patient is not nervous/anxious and does not have insomnia.     Objective  BP 123/75  Pulse 67  Temp(Src) 98 F (36.7 C) (Oral)  Ht 5' 4.25" (1.632 m)  Wt 127 lb (57.607 kg)  BMI 21.63 kg/m2  SpO2 100%  Physical Exam  Physical Exam   Constitutional: She is oriented to person, place, and time and well-developed, well-nourished, and in no distress. No distress.  HENT:  Head: Normocephalic and atraumatic.  Right Ear: External ear normal.  Left Ear: External ear normal.  Nose: Nose normal.  Mouth/Throat: Oropharynx is clear and moist. No oropharyngeal exudate.  Eyes: Conjunctivae are normal. Pupils are equal, round, and reactive to light. Right eye exhibits no discharge. Left eye exhibits no discharge. No scleral icterus.  Neck: Normal range of motion. Neck supple. No thyromegaly present.  Cardiovascular: Normal rate, regular rhythm, normal heart sounds and intact distal pulses.   No murmur heard. Pulmonary/Chest: Effort normal and breath sounds normal. No respiratory distress. She has no wheezes. She has no rales.  Abdominal: Soft. Bowel sounds are normal. She exhibits no distension and no mass. There is no tenderness.  Musculoskeletal: Normal range of motion. She exhibits no edema and no tenderness.  Lymphadenopathy:    She has no cervical adenopathy.  Neurological: She is alert and oriented to person, place, and time. She has normal reflexes. No cranial nerve deficit. Coordination normal.  Skin: Skin is warm and dry. No rash noted. She is not diaphoretic.  Psychiatric: Mood, memory and affect normal.    Lab Results  Component Value Date   TSH 2.63 09/15/2014   Lab Results  Component Value Date   WBC 6.0 09/15/2014   HGB 12.7 09/15/2014   HCT 39.0 09/15/2014   MCV 92.9 09/15/2014   PLT 252.0 09/15/2014   Lab Results  Component Value Date   CREATININE 1.0 09/15/2014   BUN 16 09/15/2014   NA 140 09/15/2014   K 4.0 09/15/2014   CL 104 09/15/2014   CO2 24 09/15/2014   Lab Results  Component Value Date   ALT 18 09/15/2014   AST 28 09/15/2014   ALKPHOS 60 09/15/2014   BILITOT 0.7 09/15/2014   Lab Results  Component Value Date   CHOL 196 09/15/2014   Lab Results  Component Value Date   HDL 61.40  09/15/2014   Lab Results  Component Value Date   LDLCALC 122* 09/15/2014   Lab Results  Component Value Date   TRIG 65.0 09/15/2014   Lab Results  Component Value Date   CHOLHDL 3 09/15/2014     Assessment & Plan  Elevated BP Well controlled, no changes to meds. Encouraged heart healthy diet such as the DASH diet and exercise as tolerated.   Hyperlipidemia Encouraged heart healthy diet, increase exercise, avoid trans fats, consider a krill oil cap daily  Medicare annual wellness visit, subsequent Patient denies any difficulties at home.  No trouble with ADLs, depression or falls. No recent changes to vision or hearing. Is UTD with immunizations. Is UTD with screening. Discussed Advanced Directives, patient agrees to bring [place] copies of documents if can. Encouraged heart healthy diet, exercise as tolerated and adequate sleep. Sees Dr Collene Mares of Gastroenterology Follows with a naturopath in [place] as well  Leukopenia Resolved with recent blood draw  Thyroid disease Tolerating Armour Thyroid no changes today

## 2014-09-20 ENCOUNTER — Ambulatory Visit: Payer: Medicare PPO | Admitting: Family Medicine

## 2014-09-21 ENCOUNTER — Ambulatory Visit: Payer: Medicare PPO | Admitting: Family Medicine

## 2014-10-19 ENCOUNTER — Other Ambulatory Visit: Payer: Self-pay | Admitting: Family Medicine

## 2014-10-20 ENCOUNTER — Encounter: Payer: Self-pay | Admitting: Family Medicine

## 2014-10-20 ENCOUNTER — Other Ambulatory Visit: Payer: Self-pay | Admitting: Family Medicine

## 2014-10-20 MED ORDER — THYROID 15 MG PO TABS: 15.0000 mg | ORAL_TABLET | Freq: Every day | ORAL | Status: DC

## 2014-12-18 ENCOUNTER — Other Ambulatory Visit: Payer: Self-pay | Admitting: Family Medicine

## 2015-01-03 ENCOUNTER — Telehealth: Payer: Self-pay | Admitting: *Deleted

## 2015-01-03 NOTE — Telephone Encounter (Signed)
Prior authorization for armour thyroid approved through [place]. [id number]

## 2015-01-25 ENCOUNTER — Telehealth: Payer: Self-pay | Admitting: Family Medicine

## 2015-01-25 NOTE — Telephone Encounter (Signed)
[facility] Day - Client TELEPHONE ADVICE RECORD [facility] Patient Name: Angela Buck DOB: [birth year] Initial Comment Caller states she has blood in her stool, nausea, and very tired. Nurse Assessment Nurse: Mechele Dawley, RN, [name] Date/Time Eilene Ghazi Time): 01/25/2015 1:32:32 PM Confirm and document reason for call. If symptomatic, describe symptoms. ---CALLER STATES THAT SHE HAD BLOOD IN BM ONCE YESTERDAY. SHE HAS HAD SEVERAL TODAY WITHOUT ANY BLOOD IN THEM. NAUSEA ONLY, NO VOMITING. FEELING TIRED. SHE IS WAKING UP TIRED. SHE HAS HAD THESE ISSUES BEFORE. THE BLOOD WAS IN THE TOILET BOWL. SHE HAD BEEN CONSTIPATED SHE FEELS THAT IT WAS MORE JUST TISSUE. SHE DID STRAIN TO HAVE THIS BM. THE BLOOD WAS DARKER RED IN COLOR. SHE IS NOT ON ANY KIND OF BLOOD THINNERS. SHE STATES IT WAS NOT A LARGE AMT OF BLOOD. Has the patient traveled out of the country within the last 30 days? ---Not Applicable Does the patient require triage? ---Yes Related visit to physician within the last 2 weeks? ---Yes Does the PT have any chronic conditions? (i.e. diabetes, asthma, etc.) ---Yes List chronic conditions. ---THYROID Guidelines Guideline Title Affirmed Question Affirmed Notes Rectal Bleeding Rectal bleeding (Exceptions: blood just on toilet paper, few drops, streaks on surface of normal formed BM) Final Disposition User See Physician within [place], RN, [name] Comments DR. BLYTH IS WHO SHE NORMALLY SEES, NO APPT'S AVAILABLE. CALLED THE PATIENT AND INFORMED HER OF NO AVAILABILITY WITH THE MD. SHE STATES SHE WILL SEE ANYONE AVAILABLE IN THE OFFICE TOMORROW. SCHEDULED WITH DR. Etter Sjogren. CALLER IS AWARE. WILL CLOSE THIS RECORD.

## 2015-01-25 NOTE — Telephone Encounter (Signed)
Appointment with Dr Etter Sjogren scheduled for tomorrow (01/26/15) at 11:00 am.

## 2015-01-26 ENCOUNTER — Ambulatory Visit (INDEPENDENT_AMBULATORY_CARE_PROVIDER_SITE_OTHER): Payer: Medicare PPO | Admitting: Family Medicine

## 2015-01-26 ENCOUNTER — Encounter: Payer: Self-pay | Admitting: Family Medicine

## 2015-01-26 VITALS — BP 103/62 | HR 78 | Temp 98.1°F | Wt 130.6 lb

## 2015-01-26 DIAGNOSIS — K5901 Slow transit constipation: Secondary | ICD-10-CM | POA: Diagnosis not present

## 2015-01-26 DIAGNOSIS — Z1231 Encounter for screening mammogram for malignant neoplasm of breast: Secondary | ICD-10-CM | POA: Diagnosis not present

## 2015-01-26 NOTE — Patient Instructions (Signed)

## 2015-01-26 NOTE — Progress Notes (Signed)
Pre visit review using our clinic review tool, if applicable. No additional management support is needed unless otherwise documented below in the visit note. 

## 2015-01-27 DIAGNOSIS — K5901 Slow transit constipation: Secondary | ICD-10-CM | POA: Insufficient documentation

## 2015-01-27 NOTE — Assessment & Plan Note (Signed)
[employer] fiber and water Symptoms have improved greatly

## 2015-01-27 NOTE — Progress Notes (Signed)
Subjective:    Patient ID: Angela Buck, female    DOB: Jul 23, 1941, 74 y.o.   MRN: [id number]  HPI  Patient here c/o constipation and nausea with blood in stool.    Past Medical History  Diagnosis Date  . Chicken pox as a child  . Measles as a child  . 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 History of mumps   . Colonic polyp 01/05/2012  . Skin cancer 12/02/2009  . Osteopenia 01/05/2012  . Preventative health care 01/05/2012  . Mass of axilla 01/05/2012  . Elevated BP 01/05/2012  . Abdominal pain 03/16/2012  . Fatigue 03/16/2012  . Thyroid disease 03/16/2012    right   . Other and unspecified hyperlipidemia 05/18/2014  . Leukopenia 05/18/2014  . Medicare annual wellness visit, subsequent 01/05/2012    Sees Dr Collene Mares of Gastroenterology     Review of Systems  Constitutional: Negative for activity change, appetite change, fatigue and unexpected weight change.  Respiratory: Negative for cough and shortness of breath.   Cardiovascular: Negative for chest pain and palpitations.  Psychiatric/Behavioral: Negative for behavioral problems and dysphoric mood. The patient is not nervous/anxious.        Objective:    Physical Exam  Constitutional: She is oriented to person, place, and time. She appears well-developed and well-nourished. No distress.  HENT:  Right Ear: External ear normal.  Left Ear: External ear normal.  Nose: Nose normal.  Mouth/Throat: Oropharynx is clear and moist.  Eyes: EOM are normal. Pupils are equal, round, and reactive to light.  Neck: Normal range of motion. Neck supple.  Cardiovascular: Normal rate, regular rhythm and normal heart sounds.   No murmur heard. Pulmonary/Chest: Effort normal and breath sounds normal. No respiratory distress. She has no wheezes. She has no rales. She exhibits no tenderness.  Neurological: She is alert and oriented to person, place, and time.  Psychiatric: She has a normal mood and affect. Her behavior is normal. Judgment and  thought content normal.    BP 103/62 mmHg  Pulse 78  Temp(Src) 98.1 F (36.7 C) (Oral)  Wt 130 lb 9.6 oz (59.24 kg)  SpO2 96% Wt Readings from Last 3 Encounters:  01/26/15 130 lb 9.6 oz (59.24 kg)  09/15/14 127 lb (57.607 kg)  05/11/14 135 lb 0.6 oz (61.254 kg)     Lab Results  Component Value Date   WBC 6.0 09/15/2014   HGB 12.7 09/15/2014   HCT 39.0 09/15/2014   PLT 252.0 09/15/2014   GLUCOSE 88 09/15/2014   CHOL 196 09/15/2014   TRIG 65.0 09/15/2014   HDL 61.40 09/15/2014   LDLCALC 122* 09/15/2014   ALT 18 09/15/2014   AST 28 09/15/2014   NA 140 09/15/2014   K 4.0 09/15/2014   CL 104 09/15/2014   CREATININE 1.0 09/15/2014   BUN 16 09/15/2014   CO2 24 09/15/2014   TSH 2.63 09/15/2014    US Abdomen Complete  03/17/2012   *RADIOLOGY REPORT*  Clinical Data:  Abdominal pain, 3 cm palpable mass in left upper quadrant.  COMPLETE ABDOMINAL ULTRASOUND  Comparison:  None.  Findings:  Gallbladder:  No gallstones, gallbladder wall thickening, or pericholecystic fluid. No sonographic Murphy's sign.  Common bile duct:  Measures 2 mm in diameter within normal limits.  Liver:  No focal lesion identified.  Within normal limits in parenchymal echogenicity.  IVC:  Limited assessment due to abundant bowel gas.  Pancreas:  Limited assessment due to abundant bowel gas  Spleen:  Measures length.  Normal echogenicity.  No definite mass is identified by ultrasound in left upper quadrant.  Right Kidney:  Measures 10.3 cm in length.  No mass, hydronephrosis or diagnostic renal calculus  Left Kidney:  Measures 10.2 cm in length.  No mass, hydronephrosis or diagnostic renal calculus  Abdominal aorta:  No aneurysm identified. Measures 2.4 cm in diameter  IMPRESSION: . 1.  No gallstones are noted within gallbladder.  Normal CBD. 2.  No mass is identified by ultrasound in the left upper quadrant. The spleen is normal size without evidence of focal mass. 3. Limited assessment of the pancreas and IVC due to  abundant bowel gas.  4.  No hydronephrosis or diagnostic renal calculus.  Original Report Authenticated By: Lahoma Crocker, M.D.      Assessment & Plan:   Problem List Items Addressed This Visit    Constipation by delayed colonic transit    [employer] fiber and water Symptoms have improved greatly       Other Visit Diagnoses    Encounter for screening mammogram for breast cancer    -  Primary    Relevant Orders    MM Digital Screening    Slow transit constipation            Garnet Koyanagi, DO

## 2015-02-16 ENCOUNTER — Ambulatory Visit
Admission: RE | Admit: 2015-02-16 | Discharge: 2015-02-16 | Disposition: A | Discharge status: Home / Self Care | Payer: Medicare PPO | Source: Ambulatory Visit | Attending: Family Medicine | Admitting: Family Medicine

## 2015-02-16 DIAGNOSIS — Z1231 Encounter for screening mammogram for malignant neoplasm of breast: Secondary | ICD-10-CM

## 2015-02-20 ENCOUNTER — Other Ambulatory Visit: Payer: Self-pay | Admitting: Family Medicine

## 2015-02-20 DIAGNOSIS — R928 Other abnormal and inconclusive findings on diagnostic imaging of breast: Secondary | ICD-10-CM

## 2015-02-22 ENCOUNTER — Ambulatory Visit
Admission: RE | Admit: 2015-02-22 | Discharge: 2015-02-22 | Disposition: A | Discharge status: Home / Self Care | Payer: Medicare PPO | Source: Ambulatory Visit | Attending: Family Medicine | Admitting: Family Medicine

## 2015-02-22 DIAGNOSIS — R928 Other abnormal and inconclusive findings on diagnostic imaging of breast: Secondary | ICD-10-CM

## 2015-03-03 ENCOUNTER — Encounter: Payer: Self-pay | Admitting: Family Medicine

## 2015-03-06 ENCOUNTER — Other Ambulatory Visit: Payer: Self-pay | Admitting: Family Medicine

## 2015-03-06 MED ORDER — THYROID 15 MG PO TABS: 15.0000 mg | ORAL_TABLET | Freq: Every day | ORAL | Status: DC

## 2015-03-16 ENCOUNTER — Ambulatory Visit: Payer: Medicare PPO | Admitting: Family Medicine

## 2015-03-17 ENCOUNTER — Ambulatory Visit: Payer: Medicare PPO | Admitting: Family Medicine

## 2015-03-24 ENCOUNTER — Ambulatory Visit (INDEPENDENT_AMBULATORY_CARE_PROVIDER_SITE_OTHER): Payer: Medicare PPO | Admitting: Family Medicine

## 2015-03-24 ENCOUNTER — Encounter: Payer: Self-pay | Admitting: Family Medicine

## 2015-03-24 VITALS — BP 100/60 | HR 63 | Temp 97.4°F | Resp 16 | Ht 64.0 in | Wt 129.0 lb

## 2015-03-24 DIAGNOSIS — Z0000 Encounter for general adult medical examination without abnormal findings: Secondary | ICD-10-CM

## 2015-03-24 DIAGNOSIS — M8580 Other specified disorders of bone density and structure, unspecified site: Secondary | ICD-10-CM

## 2015-03-24 DIAGNOSIS — E079 Disorder of thyroid, unspecified: Secondary | ICD-10-CM

## 2015-03-24 DIAGNOSIS — R030 Elevated blood-pressure reading, without diagnosis of hypertension: Secondary | ICD-10-CM

## 2015-03-24 DIAGNOSIS — IMO0001 Reserved for inherently not codable concepts without codable children: Secondary | ICD-10-CM

## 2015-03-24 DIAGNOSIS — E785 Hyperlipidemia, unspecified: Secondary | ICD-10-CM | POA: Diagnosis not present

## 2015-03-24 LAB — T3, FREE: T3, Free: 2.5 pg/mL (ref 2.3–4.2)

## 2015-03-24 LAB — T4, FREE: Free T4: 0.73 ng/dL (ref 0.60–1.60)

## 2015-03-24 LAB — TSH: TSH: 4.16 u[IU]/mL (ref 0.35–4.50)

## 2015-03-24 MED ORDER — THYROID 15 MG PO TABS: 15.0000 mg | ORAL_TABLET | Freq: Every day | ORAL | Status: DC

## 2015-03-24 NOTE — Progress Notes (Signed)
Pre visit review using our clinic review tool, if applicable. No additional management support is needed unless otherwise documented below in the visit note. 

## 2015-03-24 NOTE — Patient Instructions (Addendum)
Probiotic daily, Luckyvitamins.[place] 10 probiotic  Needs lab appt in [place] for labs ordered in October  Constipation Constipation is when a person has fewer than three bowel movements a week, has difficulty having a bowel movement, or has stools that are dry, hard, or larger than normal. As people grow older, constipation is more common. If you try to fix constipation with medicines that make you have a bowel movement (laxatives), the problem may get worse. Long-term laxative use may cause the muscles of the colon to become weak. A low-fiber diet, not taking in enough fluids, and taking certain medicines may make constipation worse.  CAUSES   Certain medicines, such as antidepressants, pain medicine, iron supplements, antacids, and water pills.   Certain diseases, such as diabetes, irritable bowel syndrome (IBS), thyroid disease, or depression.   Not drinking enough water.   Not eating enough fiber-rich foods.   Stress or travel.   Lack of physical activity or exercise.   Ignoring the urge to have a bowel movement.   Using laxatives too much.  SIGNS AND SYMPTOMS   Having fewer than three bowel movements a week.   Straining to have a bowel movement.   Having stools that are hard, dry, or larger than normal.   Feeling full or bloated.   Pain in the lower abdomen.   Not feeling relief after having a bowel movement.  DIAGNOSIS  Your health care provider will take a medical history and perform a physical exam. Further testing may be done for severe constipation. Some tests may include:  A barium enema X-ray to examine your rectum, colon, and, sometimes, your small intestine.   A sigmoidoscopy to examine your lower colon.   A colonoscopy to examine your entire colon. TREATMENT  Treatment will depend on the severity of your constipation and what is causing it. Some dietary treatments include drinking more fluids and eating more  fiber-rich foods. Lifestyle treatments may include regular exercise. If these diet and lifestyle recommendations do not help, your health care provider may recommend taking over-the-counter laxative medicines to help you have bowel movements. Prescription medicines may be prescribed if over-the-counter medicines do not work.  HOME CARE INSTRUCTIONS   Eat foods that have a lot of fiber, such as fruits, vegetables, whole grains, and beans.  Limit foods high in fat and processed sugars, such as french fries, hamburgers, cookies, candies, and soda.   A fiber supplement may be added to your diet if you cannot get enough fiber from foods.   Drink enough fluids to keep your urine clear or pale yellow.   Exercise regularly or as directed by your health care provider.   Go to the restroom when you have the urge to go. Do not hold it.   Only take over-the-counter or prescription medicines as directed by your health care provider. Do not take other medicines for constipation without talking to your health care provider first.  [place] IF:   You have bright red blood in your stool.   Your constipation lasts for more than 4 days or gets worse.   You have abdominal or rectal pain.   You have thin, pencil-like stools.   You have unexplained weight loss. MAKE SURE YOU:   Understand these instructions.  Will watch your condition.  Will get help right away if you are not doing well or get worse. Document Released: 08/16/2004 Document Revised: 11/23/2013 Document Reviewed: 08/30/2013 [facility] Patient Information 2015 [place], [place]. This  information is not intended to replace advice given to you by your health care provider. Make sure you discuss any questions you have with your health care provider.  

## 2015-04-02 NOTE — Assessment & Plan Note (Signed)
Encouraged exercise as tolerated and calcium and vitamin twice daily

## 2015-04-02 NOTE — Assessment & Plan Note (Signed)
Encouraged heart healthy diet, increase exercise, avoid trans fats, consider a krill oil cap daily 

## 2015-04-02 NOTE — Progress Notes (Signed)
Angela Buck  [id number] 08-Oct-1941 04/02/2015      Progress Note-Follow Up  Subjective  Chief Complaint  Chief Complaint  Patient presents with  . Follow-up    6 mo    HPI  Patient is a 74 y.o. female in today for routine medical care. Patient here today for follow-up. Generally doing well. Notes she was struggling with constipation but with increased greens, water and acupuncture her bowels are moving comfortably. She denies any recent illness. Denies CP/palp/SOB/HA/congestion/fevers/GI or GU c/o. Taking meds as prescribed  Past Medical History  Diagnosis Date  . Chicken pox as a child  . Measles as a child  . 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 History of mumps   . Colonic polyp 01/05/2012  . Skin cancer 12/02/2009  . Osteopenia 01/05/2012  . Preventative health care 01/05/2012  . Mass of axilla 01/05/2012  . Elevated BP 01/05/2012  . Abdominal pain 03/16/2012  . Fatigue 03/16/2012  . Thyroid disease 03/16/2012    right   . Other and unspecified hyperlipidemia 05/18/2014  . Leukopenia 05/18/2014  . Medicare annual wellness visit, subsequent 01/05/2012    Sees Dr Collene Mares of Gastroenterology     Past Surgical History  Procedure Laterality Date  . Right hip replacement  2003  . Biopsy of jawline  2011  . Laproscopy  1970    abdominal, endometriosis, with D&C  . Tonsillectomy and adenoidectomy  1949  . Cataract extraction, bilateral      b/l    Family History  Problem Relation Age of Onset  . Arthritis Mother   . Hypertension Mother   . Dementia Mother   . Heart attack Mother   . Glaucoma Mother   . Heart disease Mother     MI, stent in 2004  . Emphysema Father     smoker  . Hypertension Father   . Glaucoma Father   . COPD Father   . Other Paternal Grandfather     enlarged heart  . Ulcers Maternal Grandmother     History   Social History  . Marital Status: Divorced    Spouse Name: N/A  . Number of Children: N/A  . Years of Education: N/A    Occupational History  . Not on file.   Social History Main Topics  . Smoking status: Never Smoker   . Smokeless tobacco: Never Used  . Alcohol Use: Yes     Comment: occasional wine  . Drug Use: No  . Sexual Activity:    Partners: Female   Other Topics Concern  . Not on file   Social History Narrative    Current Outpatient Prescriptions on File Prior to Visit  Medication Sig Dispense Refill  . Ascorbic Acid (VITAMIN C) 1000 MG tablet Take 1,000 mg by mouth 2 (two) times daily.    Marland Kitchen EPINEPHrine 0.3 mg/0.3 mL IJ SOAJ injection     . NONFORMULARY OR COMPOUNDED ITEM Ioderal- 5 days a week     No current facility-administered medications on file prior to visit.    Allergies  Allergen Reactions  . Bee Venom   . Penicillins Hives    Review of Systems  Review of Systems  Constitutional: Negative for fever, chills and malaise/fatigue.  HENT: Negative for congestion, hearing loss and nosebleeds.   Eyes: Negative for discharge.  Respiratory: Negative for cough, sputum production, shortness of breath and wheezing.   Cardiovascular: Negative for chest pain, palpitations and leg swelling.  Gastrointestinal:  Positive for constipation. Negative for heartburn, nausea, vomiting, abdominal pain, diarrhea and blood in stool.  Genitourinary: Negative for dysuria, urgency, frequency and hematuria.  Musculoskeletal: Negative for myalgias, back pain and falls.  Skin: Negative for rash.  Neurological: Negative for dizziness, tremors, sensory change, focal weakness, loss of consciousness, weakness and headaches.  Endo/Heme/Allergies: Negative for polydipsia. Does not bruise/bleed easily.  Psychiatric/Behavioral: Negative for depression and suicidal ideas. The patient is not nervous/anxious and does not have insomnia.     Objective  BP 100/60 mmHg  Pulse 63  Temp(Src) 97.4 F (36.3 C)  Resp 16  Ht 5\' 4"  (1.626 m)  Wt 129 lb (58.514 kg)  BMI 22.13 kg/m2  SpO2 98%  Physical  Exam  Physical Exam  Constitutional: She is oriented to person, place, and time and well-developed, well-nourished, and in no distress. No distress.  HENT:  Head: Normocephalic and atraumatic.  Right Ear: External ear normal.  Left Ear: External ear normal.  Nose: Nose normal.  Mouth/Throat: Oropharynx is clear and moist. No oropharyngeal exudate.  Eyes: Conjunctivae are normal. Pupils are equal, round, and reactive to light. Right eye exhibits no discharge. Left eye exhibits no discharge. No scleral icterus.  Neck: Normal range of motion. Neck supple. No thyromegaly present.  Cardiovascular: Normal rate, regular rhythm, normal heart sounds and intact distal pulses.   No murmur heard. Pulmonary/Chest: Effort normal and breath sounds normal. No respiratory distress. She has no wheezes. She has no rales.  Abdominal: Soft. Bowel sounds are normal. She exhibits no distension and no mass. There is no tenderness.  Musculoskeletal: Normal range of motion. She exhibits no edema or tenderness.  Lymphadenopathy:    She has no cervical adenopathy.  Neurological: She is alert and oriented to person, place, and time. She has normal reflexes. No cranial nerve deficit. Coordination normal.  Skin: Skin is warm and dry. No rash noted. She is not diaphoretic.  Psychiatric: Mood, memory and affect normal.    Lab Results  Component Value Date   TSH 4.16 03/24/2015   Lab Results  Component Value Date   WBC 6.0 09/15/2014   HGB 12.7 09/15/2014   HCT 39.0 09/15/2014   MCV 92.9 09/15/2014   PLT 252.0 09/15/2014   Lab Results  Component Value Date   CREATININE 1.0 09/15/2014   BUN 16 09/15/2014   NA 140 09/15/2014   K 4.0 09/15/2014   CL 104 09/15/2014   CO2 24 09/15/2014   Lab Results  Component Value Date   ALT 18 09/15/2014   AST 28 09/15/2014   ALKPHOS 60 09/15/2014   BILITOT 0.7 09/15/2014   Lab Results  Component Value Date   CHOL 196 09/15/2014   Lab Results  Component Value  Date   HDL 61.40 09/15/2014   Lab Results  Component Value Date   LDLCALC 122* 09/15/2014   Lab Results  Component Value Date   TRIG 65.0 09/15/2014   Lab Results  Component Value Date   CHOLHDL 3 09/15/2014     Assessment & Plan  Thyroid disease Lab work stable on current dose of Armour Thyroid and asymptomatic   Hyperlipidemia, mild Encouraged heart healthy diet, increase exercise, avoid trans fats, consider a krill oil cap daily   Elevated BP Well controlled, no changes to meds. Encouraged heart healthy diet such as the DASH diet and exercise as tolerated.    Osteopenia Encouraged exercise as tolerated and calcium and vitamin twice daily

## 2015-04-02 NOTE — Assessment & Plan Note (Addendum)
Well controlled, no changes to meds. Encouraged heart healthy diet such as the DASH diet and exercise as tolerated.  °

## 2015-04-02 NOTE — Assessment & Plan Note (Signed)
Lab work stable on current dose of Armour Thyroid and asymptomatic

## 2015-04-20 ENCOUNTER — Other Ambulatory Visit: Payer: Self-pay | Admitting: Family Medicine

## 2015-04-20 NOTE — Telephone Encounter (Signed)
i do not understand this one. It looks like I filled a 90 day supply with 1 rf when she was in in April. If somehow they did not get this then it is OK to send in 90 day supply with 1 rf.

## 2015-06-24 ENCOUNTER — Other Ambulatory Visit: Payer: Self-pay | Admitting: Family Medicine

## 2015-09-25 ENCOUNTER — Other Ambulatory Visit (INDEPENDENT_AMBULATORY_CARE_PROVIDER_SITE_OTHER): Payer: Medicare PPO

## 2015-09-25 DIAGNOSIS — E079 Disorder of thyroid, unspecified: Secondary | ICD-10-CM | POA: Diagnosis not present

## 2015-09-25 DIAGNOSIS — E785 Hyperlipidemia, unspecified: Secondary | ICD-10-CM

## 2015-09-25 DIAGNOSIS — Z0000 Encounter for general adult medical examination without abnormal findings: Secondary | ICD-10-CM | POA: Diagnosis not present

## 2015-09-25 LAB — CBC
HCT: 36.6 % (ref 36.0–46.0)
Hemoglobin: 12.1 g/dL (ref 12.0–15.0)
MCHC: 33 g/dL (ref 30.0–36.0)
MCV: 92 fl (ref 78.0–100.0)
Platelets: 216 10*3/uL (ref 150.0–400.0)
RBC: 3.97 Mil/uL (ref 3.87–5.11)
RDW: 13.8 % (ref 11.5–15.5)
WBC: 4.3 10*3/uL (ref 4.0–10.5)

## 2015-09-25 LAB — COMPREHENSIVE METABOLIC PANEL
ALT: 18 U/L (ref 0–35)
AST: 27 U/L (ref 0–37)
Albumin: 3.7 g/dL (ref 3.5–5.2)
Alkaline Phosphatase: 49 U/L (ref 39–117)
BUN: 18 mg/dL (ref 6–23)
CO2: 30 mEq/L (ref 19–32)
Calcium: 9.1 mg/dL (ref 8.4–10.5)
Chloride: 106 mEq/L (ref 96–112)
Creatinine, Ser: 0.9 mg/dL (ref 0.40–1.20)
GFR: 65.37 mL/min (ref >60.00)
Glucose, Bld: 86 mg/dL (ref 70–99)
Potassium: 4.7 mEq/L (ref 3.5–5.1)
Sodium: 142 mEq/L (ref 135–145)
Total Bilirubin: 0.3 mg/dL (ref 0.2–1.2)
Total Protein: 6 g/dL (ref 6.0–8.3)

## 2015-09-25 LAB — T3, FREE: T3, Free: 2.5 pg/mL (ref 2.3–4.2)

## 2015-09-25 LAB — LIPID PANEL
Cholesterol: 176 mg/dL (ref 0–200)
HDL: 69.7 mg/dL (ref >39.00)
LDL Cholesterol: 96 mg/dL (ref 0–99)
NonHDL: 106.79
Total CHOL/HDL Ratio: 3
Triglycerides: 53 mg/dL (ref 0.0–149.0)
VLDL: 10.6 mg/dL (ref 0.0–40.0)

## 2015-09-25 LAB — TSH: TSH: 5.75 u[IU]/mL — ABNORMAL HIGH (ref 0.35–4.50)

## 2015-09-25 LAB — T4, FREE: Free T4: 0.77 ng/dL (ref 0.60–1.60)

## 2015-09-28 ENCOUNTER — Telehealth: Payer: Self-pay

## 2015-09-28 NOTE — Telephone Encounter (Signed)
Pre Visit Call Completed. 

## 2015-09-29 ENCOUNTER — Encounter: Payer: Self-pay | Admitting: Family Medicine

## 2015-09-29 ENCOUNTER — Ambulatory Visit (INDEPENDENT_AMBULATORY_CARE_PROVIDER_SITE_OTHER): Payer: Medicare PPO | Admitting: Family Medicine

## 2015-09-29 ENCOUNTER — Telehealth: Payer: Self-pay | Admitting: Family Medicine

## 2015-09-29 VITALS — BP 118/82 | HR 86 | Temp 97.9°F | Ht 64.0 in | Wt 132.0 lb

## 2015-09-29 DIAGNOSIS — M8580 Other specified disorders of bone density and structure, unspecified site: Secondary | ICD-10-CM | POA: Diagnosis not present

## 2015-09-29 DIAGNOSIS — E079 Disorder of thyroid, unspecified: Secondary | ICD-10-CM | POA: Diagnosis not present

## 2015-09-29 DIAGNOSIS — R030 Elevated blood-pressure reading, without diagnosis of hypertension: Secondary | ICD-10-CM | POA: Diagnosis not present

## 2015-09-29 DIAGNOSIS — E785 Hyperlipidemia, unspecified: Secondary | ICD-10-CM

## 2015-09-29 DIAGNOSIS — M810 Age-related osteoporosis without current pathological fracture: Secondary | ICD-10-CM

## 2015-09-29 DIAGNOSIS — Z0000 Encounter for general adult medical examination without abnormal findings: Secondary | ICD-10-CM

## 2015-09-29 DIAGNOSIS — Z780 Asymptomatic menopausal state: Secondary | ICD-10-CM

## 2015-09-29 DIAGNOSIS — IMO0001 Reserved for inherently not codable concepts without codable children: Secondary | ICD-10-CM

## 2015-09-29 DIAGNOSIS — E039 Hypothyroidism, unspecified: Secondary | ICD-10-CM

## 2015-09-29 MED ORDER — THYROID 15 MG PO TABS: ORAL_TABLET | ORAL | Status: DC

## 2015-09-29 NOTE — Progress Notes (Signed)
Pre visit review using our clinic review tool, if applicable. No additional management support is needed unless otherwise documented below in the visit note. 

## 2015-09-29 NOTE — Patient Instructions (Addendum)
Preventive Care for Adults, Female A healthy lifestyle and preventive care can promote health and wellness. Preventive health guidelines for women include the following key practices.  A routine yearly physical is a good way to check with your health care provider about your health and preventive screening. It is a chance to share any concerns and updates on your health and to receive a thorough exam.  Visit your dentist for a routine exam and preventive care every 6 months. Brush your teeth twice a day and floss once a day. Good oral hygiene prevents tooth decay and gum disease.  The frequency of eye exams is based on your age, health, family medical history, use of contact lenses, and other factors. Follow your health care provider's recommendations for frequency of eye exams.  Eat a healthy diet. Foods like vegetables, fruits, whole grains, low-fat dairy products, and lean protein foods contain the nutrients you need without too many calories. Decrease your intake of foods high in solid fats, added sugars, and salt. Eat the right amount of calories for you.Get information about a proper diet from your health care provider, if necessary.  Regular physical exercise is one of the most important things you can do for your health. Most adults should get at least 150 minutes of moderate-intensity exercise (any activity that increases your heart rate and causes you to sweat) each week. In addition, most adults need muscle-strengthening exercises on 2 or more days a week.  Maintain a healthy weight. The body mass index (BMI) is a screening tool to identify possible weight problems. It provides an estimate of body fat based on height and weight. Your health care provider can find your BMI and can help you achieve or maintain a healthy weight.For adults 20 years and older:  A BMI below 18.5 is considered underweight.  A BMI of 18.5 to 24.9 is normal.  A BMI of 25 to 29.9 is considered overweight.  A  BMI of 30 and above is considered obese.  Maintain normal blood lipids and cholesterol levels by exercising and minimizing your intake of saturated fat. Eat a balanced diet with plenty of fruit and vegetables. Blood tests for lipids and cholesterol should begin at age 20 and be repeated every 5 years. If your lipid or cholesterol levels are high, you are over 50, or you are at high risk for heart disease, you may need your cholesterol levels checked more frequently.Ongoing high lipid and cholesterol levels should be treated with medicines if diet and exercise are not working.  If you smoke, find out from your health care provider how to quit. If you do not use tobacco, do not start.  Lung cancer screening is recommended for adults aged 55-80 years who are at high risk for developing lung cancer because of a history of smoking. A yearly low-dose CT scan of the lungs is recommended for people who have at least a 30-pack-year history of smoking and are a current smoker or have quit within the past 15 years. A pack year of smoking is smoking an average of 1 pack of cigarettes a day for 1 year (for example: 1 pack a day for 30 years or 2 packs a day for 15 years). Yearly screening should continue until the smoker has stopped smoking for at least 15 years. Yearly screening should be stopped for people who develop a health problem that would prevent them from having lung cancer treatment.  If you are pregnant, do not drink alcohol. If you are   are breastfeeding, be very cautious about drinking alcohol. If you are not pregnant and choose to drink alcohol, do not have more than 1 drink per day. One drink is considered to be 12 ounces (355 mL) of beer, 5 ounces (148 mL) of wine, or 1.5 ounces (44 mL) of liquor.  Avoid use of street drugs. Do not share needles with anyone. Ask for help if you need support or instructions about stopping the use of drugs.  High blood pressure causes heart disease and  increases the risk of stroke. Your blood pressure should be checked at least every 1 to 2 years. Ongoing high blood pressure should be treated with medicines if weight loss and exercise do not work.  If you are 12-58 years old, ask your health care provider if you should take aspirin to prevent strokes.  Diabetes screening is done by taking a blood sample to check your blood glucose level after you have not eaten for a certain period of time (fasting). If you are not overweight and you do not have risk factors for diabetes, you should be screened once every 3 years starting at age 76. If you are overweight or obese and you are 18-1 years of age, you should be screened for diabetes every year as part of your cardiovascular risk assessment.  Breast cancer screening is essential preventive care for women. You should practice "breast self-awareness." This means understanding the normal appearance and feel of your breasts and may include breast self-examination. Any changes detected, no matter how small, should be reported to a health care provider. Women in their 73s and 30s should have a clinical breast exam (CBE) by a health care provider as part of a regular health exam every 1 to 3 years. After age 36, women should have a CBE every year. Starting at age 50, women should consider having a mammogram (breast X-ray test) every year. Women who have a family history of breast cancer should talk to their health care provider about genetic screening. Women at a high risk of breast cancer should talk to their health care providers about having an MRI and a mammogram every year.  Breast cancer gene (BRCA)-related cancer risk assessment is recommended for women who have family members with BRCA-related cancers. BRCA-related cancers include breast, ovarian, tubal, and peritoneal cancers. Having family members with these cancers may be associated with an increased risk for harmful changes (mutations) in the breast  cancer genes BRCA1 and BRCA2. Results of the assessment will determine the need for genetic counseling and BRCA1 and BRCA2 testing.  Your health care provider may recommend that you be screened regularly for cancer of the pelvic organs (ovaries, uterus, and vagina). This screening involves a pelvic examination, including checking for microscopic changes to the surface of your cervix (Pap test). You may be encouraged to have this screening done every 3 years, beginning at age 40.  For women ages 32-65, health care providers may recommend pelvic exams and Pap testing every 3 years, or they may recommend the Pap and pelvic exam, combined with testing for human papilloma virus (HPV), every 5 years. Some types of HPV increase your risk of cervical cancer. Testing for HPV may also be done on women of any age with unclear Pap test results.  Other health care providers may not recommend any screening for nonpregnant women who are considered low risk for pelvic cancer and who do not have symptoms. Ask your health care provider if a screening pelvic exam is right  you.  If you have had past treatment for cervical cancer or a condition that could lead to cancer, you need Pap tests and screening for cancer for at least 20 years after your treatment. If Pap tests have been discontinued, your risk factors (such as having a new sexual partner) need to be reassessed to determine if screening should resume. Some women have medical problems that increase the chance of getting cervical cancer. In these cases, your health care provider may recommend more frequent screening and Pap tests.  Colorectal cancer can be detected and often prevented. Most routine colorectal cancer screening begins at the age of 50 years and continues through age 75 years. However, your health care provider may recommend screening at an earlier age if you have risk factors for colon cancer. On a yearly basis, your health care provider may provide home test kits to check  for hidden blood in the stool. Use of a small camera at the end of a tube, to directly examine the colon (sigmoidoscopy or colonoscopy), can detect the earliest forms of colorectal cancer. Talk to your health care provider about this at age 50, when routine screening begins. Direct exam of the colon should be repeated every 5-10 years through age 75 years, unless early forms of precancerous polyps or small growths are found.  People who are at an increased risk for hepatitis B should be screened for this virus. You are considered at high risk for hepatitis B if:  You were born in a country where hepatitis B occurs often. Talk with your health care provider about which countries are considered high risk.  Your parents were born in a high-risk country and you have not received a shot to protect against hepatitis B (hepatitis B vaccine).  You have HIV or AIDS.  You use needles to inject street drugs.  You live with, or have sex with, someone who has hepatitis B.  You get hemodialysis treatment.  You take certain medicines for conditions like cancer, organ transplantation, and autoimmune conditions.  Hepatitis C blood testing is recommended for all people born from 1945 through 1965 and any individual with known risks for hepatitis C.  Practice safe sex. Use condoms and avoid high-risk sexual practices to reduce the spread of sexually transmitted infections (STIs). STIs include gonorrhea, chlamydia, syphilis, trichomonas, herpes, HPV, and human immunodeficiency virus (HIV). Herpes, HIV, and HPV are viral illnesses that have no cure. They can result in disability, cancer, and death.  You should be screened for sexually transmitted illnesses (STIs) including gonorrhea and chlamydia if:  You are sexually active and are younger than 24 years.  You are older than 24 years and your health care provider tells you that you are at risk for this type of infection.  Your sexual activity has changed  since you were last screened and you are at an increased risk for chlamydia or gonorrhea. Ask your health care provider if you are at risk.  If you are at risk of being infected with HIV, it is recommended that you take a prescription medicine daily to prevent HIV infection. This is called preexposure prophylaxis (PrEP). You are considered at risk if:  You are sexually active and do not regularly use condoms or know the HIV status of your partner(s).  You take drugs by injection.  You are sexually active with a partner who has HIV.  Talk with your health care provider about whether you are at high risk of being infected with HIV. If   you choose to begin PrEP, you should first be tested for HIV. You should then be tested every 3 months for as long as you are taking PrEP.  Osteoporosis is a disease in which the bones lose minerals and strength with aging. This can result in serious bone fractures or breaks. The risk of osteoporosis can be identified using a bone density scan. Women ages 65 years and over and women at risk for fractures or osteoporosis should discuss screening with their health care providers. Ask your health care provider whether you should take a calcium supplement or vitamin D to reduce the rate of osteoporosis.  Menopause can be associated with physical symptoms and risks. Hormone replacement therapy is available to decrease symptoms and risks. You should talk to your health care provider about whether hormone replacement therapy is right for you.  Use sunscreen. Apply sunscreen liberally and repeatedly throughout the day. You should seek shade when your shadow is shorter than you. Protect yourself by wearing long sleeves, pants, a wide-brimmed hat, and sunglasses year round, whenever you are outdoors.  Once a month, do a whole body skin exam, using a mirror to look at the skin on your back. Tell your health care provider of new moles, moles that have irregular borders, moles that  are larger than a pencil eraser, or moles that have changed in shape or color.  Stay current with required vaccines (immunizations).  Influenza vaccine. All adults should be immunized every year.  Tetanus, diphtheria, and acellular pertussis (Td, Tdap) vaccine. Pregnant women should receive 1 dose of Tdap vaccine during each pregnancy. The dose should be obtained regardless of the length of time since the last dose. Immunization is preferred during the 27th-36th week of gestation. An adult who has not previously received Tdap or who does not know her vaccine status should receive 1 dose of Tdap. This initial dose should be followed by tetanus and diphtheria toxoids (Td) booster doses every 10 years. Adults with an unknown or incomplete history of completing a 3-dose immunization series with Td-containing vaccines should begin or complete a primary immunization series including a Tdap dose. Adults should receive a Td booster every 10 years.  Varicella vaccine. An adult without evidence of immunity to varicella should receive 2 doses or a second dose if she has previously received 1 dose. Pregnant females who do not have evidence of immunity should receive the first dose after pregnancy. This first dose should be obtained before leaving the health care facility. The second dose should be obtained 4-8 weeks after the first dose.  Human papillomavirus (HPV) vaccine. Females aged 13-26 years who have not received the vaccine previously should obtain the 3-dose series. The vaccine is not recommended for use in pregnant females. However, pregnancy testing is not needed before receiving a dose. If a female is found to be pregnant after receiving a dose, no treatment is needed. In that case, the remaining doses should be delayed until after the pregnancy. Immunization is recommended for any person with an immunocompromised condition through the age of 26 years if she did not get any or all doses earlier. During the  3-dose series, the second dose should be obtained 4-8 weeks after the first dose. The third dose should be obtained 24 weeks after the first dose and 16 weeks after the second dose.  Zoster vaccine. One dose is recommended for adults aged 60 years or older unless certain conditions are present.  Measles, mumps, and rubella (MMR) vaccine. Adults born   born before [birth year] generally are considered immune to measles and mumps. Adults born in [birth year] or later should have 1 or more doses of MMR vaccine unless there is a contraindication to the vaccine or there is laboratory evidence of immunity to each of the three diseases. A routine second dose of MMR vaccine should be obtained at least 28 days after the first dose for students attending postsecondary schools, health care workers, or international travelers. People who received inactivated measles vaccine or an unknown type of measles vaccine during 1963-1967 should receive 2 doses of MMR vaccine. People who received inactivated mumps vaccine or an unknown type of mumps vaccine before 1979 and are at high risk for mumps infection should consider immunization with 2 doses of MMR vaccine. For females of childbearing age, rubella immunity should be determined. If there is no evidence of immunity, females who are not pregnant should be vaccinated. If there is no evidence of immunity, females who are pregnant should delay immunization until after pregnancy. Unvaccinated health care workers born before [birth year] who lack laboratory evidence of measles, mumps, or rubella immunity or laboratory confirmation of disease should consider measles and mumps immunization with 2 doses of MMR vaccine or rubella immunization with 1 dose of MMR vaccine.  Pneumococcal 13-valent conjugate (PCV13) vaccine. When indicated, a person who is uncertain of his immunization history and has no record of immunization should receive the PCV13 vaccine. All adults 62 years of age and older  should receive this vaccine. An adult aged 39 years or older who has certain medical conditions and has not been previously immunized should receive 1 dose of PCV13 vaccine. This PCV13 should be followed with a dose of pneumococcal polysaccharide (PPSV23) vaccine. Adults who are at high risk for pneumococcal disease should obtain the PPSV23 vaccine at least 8 weeks after the dose of PCV13 vaccine. Adults older than 72 years of age who have normal immune system function should obtain the PPSV23 vaccine dose at least 1 year after the dose of PCV13 vaccine.  Pneumococcal polysaccharide (PPSV23) vaccine. When PCV13 is also indicated, PCV13 should be obtained first. All adults aged 47 years and older should be immunized. An adult younger than age 27 years who has certain medical conditions should be immunized. Any person who resides in a nursing home or long-term care facility should be immunized. An adult smoker should be immunized. People with an immunocompromised condition and certain other conditions should receive both PCV13 and PPSV23 vaccines. People with human immunodeficiency virus (HIV) infection should be immunized as soon as possible after diagnosis. Immunization during chemotherapy or radiation therapy should be avoided. Routine use of PPSV23 vaccine is not recommended for American Indians, [place] Natives, or people younger than 65 years unless there are medical conditions that require PPSV23 vaccine. When indicated, people who have unknown immunization and have no record of immunization should receive PPSV23 vaccine. One-time revaccination 5 years after the first dose of PPSV23 is recommended for people aged 19-64 years who have chronic kidney failure, nephrotic syndrome, asplenia, or immunocompromised conditions. People who received 1-2 doses of PPSV23 before age 57 years should receive another dose of PPSV23 vaccine at age 42 years or later if at least 5 years have passed since the previous dose. Doses  of PPSV23 are not needed for people immunized with PPSV23 at or after age 51 years.  Meningococcal vaccine. Adults with asplenia or persistent complement component deficiencies should receive 2 doses of quadrivalent meningococcal conjugate (MenACWY-D) vaccine. The doses should be  at least 2 months apart. Microbiologists working with certain meningococcal bacteria, military recruits, people at risk during an outbreak, and people who travel to or live in countries with a high rate of meningitis should be immunized. A first-year college student up through age 21 years who is living in a residence hall should receive a dose if she did not receive a dose on or after her 16th birthday. Adults who have certain high-risk conditions should receive one or more doses of vaccine.  Hepatitis A vaccine. Adults who wish to be protected from this disease, have certain high-risk conditions, work with hepatitis A-infected animals, work in hepatitis A research labs, or travel to or work in countries with a high rate of hepatitis A should be immunized. Adults who were previously unvaccinated and who anticipate close contact with an international adoptee during the first 60 days after arrival in the [place] from a country with a high rate of hepatitis A should be immunized.  Hepatitis B vaccine. Adults who wish to be protected from this disease, have certain high-risk conditions, may be exposed to blood or other infectious body fluids, are household contacts or sex partners of hepatitis B positive people, are clients or workers in certain care facilities, or travel to or work in countries with a high rate of hepatitis B should be immunized.  Haemophilus influenzae type b (Hib) vaccine. A previously unvaccinated person with asplenia or sickle cell disease or having a scheduled splenectomy should receive 1 dose of Hib vaccine. Regardless of previous immunization, a recipient of a hematopoietic stem cell transplant should receive a  3-dose series 6-12 months after her successful transplant. Hib vaccine is not recommended for adults with HIV infection. Preventive Services / Frequency Ages 19 to 39 years  Blood pressure check.** / Every 3-5 years.  Lipid and cholesterol check.** / Every 5 years beginning at age 20.  Clinical breast exam.** / Every 3 years for women in their 20s and 30s.  BRCA-related cancer risk assessment.** / For women who have family members with a BRCA-related cancer (breast, ovarian, tubal, or peritoneal cancers).  Pap test.** / Every 2 years from ages 21 through 29. Every 3 years starting at age 30 through age 65 or 70 with a history of 3 consecutive normal Pap tests.  HPV screening.** / Every 3 years from ages 30 through ages 65 to 70 with a history of 3 consecutive normal Pap tests.  Hepatitis C blood test.** / For any individual with known risks for hepatitis C.  Skin self-exam. / Monthly.  Influenza vaccine. / Every year.  Tetanus, diphtheria, and acellular pertussis (Tdap, Td) vaccine.** / Consult your health care provider. Pregnant women should receive 1 dose of Tdap vaccine during each pregnancy. 1 dose of Td every 10 years.  Varicella vaccine.** / Consult your health care provider. Pregnant females who do not have evidence of immunity should receive the first dose after pregnancy.  HPV vaccine. / 3 doses over 6 months, if 26 and younger. The vaccine is not recommended for use in pregnant females. However, pregnancy testing is not needed before receiving a dose.  Measles, mumps, rubella (MMR) vaccine.** / You need at least 1 dose of MMR if you were born in 1957 or later. You may also need a 2nd dose. For females of childbearing age, rubella immunity should be determined. If there is no evidence of immunity, females who are not pregnant should be vaccinated. If there is no evidence of immunity, females who are   pregnant should delay immunization until after pregnancy.  Pneumococcal  13-valent conjugate (PCV13) vaccine.** / Consult your health care provider.  Pneumococcal polysaccharide (PPSV23) vaccine.** / 1 to 2 doses if you smoke cigarettes or if you have certain conditions.  Meningococcal vaccine.** / 1 dose if you are age 19 to 21 years and a first-year college student living in a residence hall, or have one of several medical conditions, you need to get vaccinated against meningococcal disease. You may also need additional booster doses.  Hepatitis A vaccine.** / Consult your health care provider.  Hepatitis B vaccine.** / Consult your health care provider.  Haemophilus influenzae type b (Hib) vaccine.** / Consult your health care provider. Ages 40 to 64 years  Blood pressure check.** / Every year.  Lipid and cholesterol check.** / Every 5 years beginning at age 20 years.  Lung cancer screening. / Every year if you are aged 55-80 years and have a 30-pack-year history of smoking and currently smoke or have quit within the past 15 years. Yearly screening is stopped once you have quit smoking for at least 15 years or develop a health problem that would prevent you from having lung cancer treatment.  Clinical breast exam.** / Every year after age 40 years.  BRCA-related cancer risk assessment.** / For women who have family members with a BRCA-related cancer (breast, ovarian, tubal, or peritoneal cancers).  Mammogram.** / Every year beginning at age 40 years and continuing for as long as you are in good health. Consult with your health care provider.  Pap test.** / Every 3 years starting at age 30 years through age 65 or 70 years with a history of 3 consecutive normal Pap tests.  HPV screening.** / Every 3 years from ages 30 years through ages 65 to 70 years with a history of 3 consecutive normal Pap tests.  Fecal occult blood test (FOBT) of stool. / Every year beginning at age 50 years and continuing until age 75 years. You may not need to do this test if you get  a colonoscopy every 10 years.  Flexible sigmoidoscopy or colonoscopy.** / Every 5 years for a flexible sigmoidoscopy or every 10 years for a colonoscopy beginning at age 50 years and continuing until age 75 years.  Hepatitis C blood test.** / For all people born from 1945 through 1965 and any individual with known risks for hepatitis C.  Skin self-exam. / Monthly.  Influenza vaccine. / Every year.  Tetanus, diphtheria, and acellular pertussis (Tdap/Td) vaccine.** / Consult your health care provider. Pregnant women should receive 1 dose of Tdap vaccine during each pregnancy. 1 dose of Td every 10 years.  Varicella vaccine.** / Consult your health care provider. Pregnant females who do not have evidence of immunity should receive the first dose after pregnancy.  Zoster vaccine.** / 1 dose for adults aged 60 years or older.  Measles, mumps, rubella (MMR) vaccine.** / You need at least 1 dose of MMR if you were born in 1957 or later. You may also need a second dose. For females of childbearing age, rubella immunity should be determined. If there is no evidence of immunity, females who are not pregnant should be vaccinated. If there is no evidence of immunity, females who are pregnant should delay immunization until after pregnancy.  Pneumococcal 13-valent conjugate (PCV13) vaccine.** / Consult your health care provider.  Pneumococcal polysaccharide (PPSV23) vaccine.** / 1 to 2 doses if you smoke cigarettes or if you have certain conditions.  Meningococcal vaccine.** /   Consult your health care provider.  Hepatitis A vaccine.** / Consult your health care provider.  Hepatitis B vaccine.** / Consult your health care provider.  Haemophilus influenzae type b (Hib) vaccine.** / Consult your health care provider. Ages 41 years and over  Blood pressure check.** / Every year.  Lipid and cholesterol check.** / Every 5 years beginning at age 65 years.  Lung cancer  screening. / Every year if you are aged 8-80 years and have a 30-pack-year history of smoking and currently smoke or have quit within the past 15 years. Yearly screening is stopped once you have quit smoking for at least 15 years or develop a health problem that would prevent you from having lung cancer treatment.  Clinical breast exam.** / Every year after age 42 years.  BRCA-related cancer risk assessment.** / For women who have family members with a BRCA-related cancer (breast, ovarian, tubal, or peritoneal cancers).  Mammogram.** / Every year beginning at age 77 years and continuing for as long as you are in good health. Consult with your health care provider.  Pap test.** / Every 3 years starting at age 64 years through age 77 or 35 years with 3 consecutive normal Pap tests. Testing can be stopped between 65 and 70 years with 3 consecutive normal Pap tests and no abnormal Pap or HPV tests in the past 10 years.  HPV screening.** / Every 3 years from ages 47 years through ages 42 or 4 years with a history of 3 consecutive normal Pap tests. Testing can be stopped between 65 and 70 years with 3 consecutive normal Pap tests and no abnormal Pap or HPV tests in the past 10 years.  Fecal occult blood test (FOBT) of stool. / Every year beginning at age 86 years and continuing until age 87 years. You may not need to do this test if you get a colonoscopy every 10 years.  Flexible sigmoidoscopy or colonoscopy.** / Every 5 years for a flexible sigmoidoscopy or every 10 years for a colonoscopy beginning at age 3 years and continuing until age 72 years.  Hepatitis C blood test.** / For all people born from [birth year] through 1963 and any individual with known risks for hepatitis C.  Osteoporosis screening.** / A one-time screening for women ages 33 years and over and women at risk for fractures or osteoporosis.  Skin self-exam. / Monthly.  Influenza vaccine. / Every year.  Tetanus, diphtheria, and  acellular pertussis (Tdap/Td) vaccine.** / 1 dose of Td every 10 years.  Varicella vaccine.** / Consult your health care provider.  Zoster vaccine.** / 1 dose for adults aged 10 years or older.  Pneumococcal 13-valent conjugate (PCV13) vaccine.** / Consult your health care provider.  Pneumococcal polysaccharide (PPSV23) vaccine.** / 1 dose for all adults aged 52 years and older.  Meningococcal vaccine.** / Consult your health care provider.  Hepatitis A vaccine.** / Consult your health care provider.  Hepatitis B vaccine.** / Consult your health care provider.  Haemophilus influenzae type b (Hib) vaccine.** / Consult your health care provider. ** Family history and personal history of risk and conditions may change your health care provider's recommendations.   This information is not intended to replace advice given to you by your health care provider. Make sure you discuss any questions you have with your health care provider.   Document Released: 01/14/2002 Document Revised: 12/09/2014 Document Reviewed: 04/15/2011 Elsevier Interactive Patient Education [employer].

## 2015-09-29 NOTE — Telephone Encounter (Signed)
Relation to UY:ZJQD Call back number:[phone] Pharmacy:  Reason for call:  As per 09/29/15 AVS pre visit labs due in January patient would like labs drawn at [place].

## 2015-09-29 NOTE — Telephone Encounter (Signed)
Patient informed ok to do [facility] can see in computer.

## 2015-10-08 NOTE — Assessment & Plan Note (Signed)
Well controlled. Encouraged heart healthy diet such as the DASH diet and exercise as tolerated.  

## 2015-10-08 NOTE — Assessment & Plan Note (Signed)
Patient denies any difficulties at home. No trouble with ADLs, depression or falls. No recent changes to vision or hearing. Is UTD with immunizations. Is UTD with screening. Discussed Advanced Directives, patient agrees to bring [place] copies of documents if can. Encouraged heart healthy diet, exercise as tolerated and adequate sleep  Flu vaccine-- Refused Tdap--Refused PNA--Refused Shingles--Refused   Pap-- N/A  MMG-- Due 01/2017  Bone Density-- Completed  CCS--Due 12/2020  Care Teams Updated: D. Howes AccupunctureAdvised Advised  See problem list for risk factors.  See AVS for recommended preventative health schedule

## 2015-10-08 NOTE — Assessment & Plan Note (Signed)
Feels well, no changes to doses for now will continue to monitor

## 2015-10-08 NOTE — Assessment & Plan Note (Signed)
Encouraged heart healthy diet, increase exercise, avoid trans fats, consider a krill oil cap daily 

## 2015-10-08 NOTE — Progress Notes (Signed)
Subjective:    Patient ID: Angela Buck, female    DOB: [birth year], 74 y.o.   MRN: [id number]  Chief Complaint  Patient presents with  . Annual Exam    HPI Patient is in today for annual exam. Overall feeling well. No recent illness. Continues to follow with a natural path and comes back from her visit with him with no concerns. No acute concerns. Denies CP/palp/SOB/HA/congestion/fevers/GI or GU c/o. Taking meds as prescribed . Eats a heart healthy diet and exercises regularly  Past Medical History  Diagnosis Date  . Chicken pox as a child  . Measles as a child  . 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 History of mumps   . Colonic polyp 01/05/2012  . Skin cancer 12/02/2009  . Osteopenia 01/05/2012  . Preventative health care 01/05/2012  . Mass of axilla 01/05/2012  . Elevated BP 01/05/2012  . Abdominal pain 03/16/2012  . Fatigue 03/16/2012  . Thyroid disease 03/16/2012    right   . Other and unspecified hyperlipidemia 05/18/2014  . Leukopenia 05/18/2014  . Medicare annual wellness visit, subsequent 01/05/2012    Sees Dr Collene Mares of Gastroenterology     Past Surgical History  Procedure Laterality Date  . Right hip replacement  2003  . Biopsy of jawline  2011  . Laproscopy  1970    abdominal, endometriosis, with D&C  . Tonsillectomy and adenoidectomy  1949  . Cataract extraction, bilateral      b/l    Family History  Problem Relation Age of Onset  . Arthritis Mother   . Hypertension Mother   . Dementia Mother   . Heart attack Mother   . Glaucoma Mother   . Heart disease Mother     MI, stent in 2004  . Emphysema Father     smoker  . Hypertension Father   . Glaucoma Father   . COPD Father   . Other Paternal Grandfather     enlarged heart  . Ulcers Maternal Grandmother     Social History   Social History  . Marital Status: Divorced    Spouse Name: N/A  . Number of Children: N/A  . Years of Education: N/A   Occupational History  . Not on file.    Social History Main Topics  . Smoking status: Never Smoker   . Smokeless tobacco: Never Used  . Alcohol Use: Yes     Comment: occasional wine  . Drug Use: No  . Sexual Activity:    Partners: Female     Comment: lives with partner, no major dietary restrictions.    Other Topics Concern  . Not on file   Social History Narrative    Outpatient Prescriptions Prior to Visit  Medication Sig Dispense Refill  . Ascorbic Acid (VITAMIN C) 1000 MG tablet Take 1,000 mg by mouth 2 (two) times daily.    . NONFORMULARY OR COMPOUNDED ITEM Ioderal- 5 days a week    . ARMOUR THYROID 15 MG tablet TAKE 1 TABLET BY MOUTH EVERY DAY 30 tablet 6  . EPINEPHrine 0.3 mg/0.3 mL IJ SOAJ injection      No facility-administered medications prior to visit.    Allergies  Allergen Reactions  . Bee Venom   . Penicillins Hives    Review of Systems  Constitutional: Negative for fever, chills and malaise/fatigue.  HENT: Negative for congestion and hearing loss.   Eyes: Negative for discharge.  Respiratory: Negative for cough, sputum production and  shortness of breath.   Cardiovascular: Negative for chest pain, palpitations and leg swelling.  Gastrointestinal: Negative for heartburn, nausea, vomiting, abdominal pain, diarrhea, constipation and blood in stool.  Genitourinary: Negative for dysuria, urgency, frequency and hematuria.  Musculoskeletal: Negative for myalgias, back pain and falls.  Skin: Negative for rash.  Neurological: Negative for dizziness, sensory change, loss of consciousness, weakness and headaches.  Endo/Heme/Allergies: Negative for environmental allergies. Does not bruise/bleed easily.  Psychiatric/Behavioral: Negative for depression and suicidal ideas. The patient is not nervous/anxious and does not have insomnia.        Objective:    Physical Exam  Constitutional: She is oriented to person, place, and time. She appears well-developed and well-nourished. No distress.  HENT:   Head: Normocephalic and atraumatic.  Eyes: Conjunctivae are normal.  Neck: Neck supple. No thyromegaly present.  Cardiovascular: Normal rate, regular rhythm and normal heart sounds.   No murmur heard. Pulmonary/Chest: Effort normal and breath sounds normal. No respiratory distress.  Abdominal: Soft. Bowel sounds are normal. She exhibits no distension and no mass. There is no tenderness.  Musculoskeletal: She exhibits no edema.  Lymphadenopathy:    She has no cervical adenopathy.  Neurological: She is alert and oriented to person, place, and time.  Skin: Skin is warm and dry.  Psychiatric: She has a normal mood and affect. Her behavior is normal.    BP 118/82 mmHg  Pulse 86  Temp(Src) 97.9 F (36.6 C) (Oral)  Ht 5\' 4"  (1.626 m)  Wt 132 lb (59.875 kg)  BMI 22.65 kg/m2  SpO2 97% Wt Readings from Last 3 Encounters:  09/29/15 132 lb (59.875 kg)  03/24/15 129 lb (58.514 kg)  01/26/15 130 lb 9.6 oz (59.24 kg)     Lab Results  Component Value Date   WBC 4.3 09/25/2015   HGB 12.1 09/25/2015   HCT 36.6 09/25/2015   PLT 216.0 09/25/2015   GLUCOSE 86 09/25/2015   CHOL 176 09/25/2015   TRIG 53.0 09/25/2015   HDL 69.70 09/25/2015   LDLCALC 96 09/25/2015   ALT 18 09/25/2015   AST 27 09/25/2015   NA 142 09/25/2015   K 4.7 09/25/2015   CL 106 09/25/2015   CREATININE 0.90 09/25/2015   BUN 18 09/25/2015   CO2 30 09/25/2015   TSH 5.75* 09/25/2015    Lab Results  Component Value Date   TSH 5.75* 09/25/2015   Lab Results  Component Value Date   WBC 4.3 09/25/2015   HGB 12.1 09/25/2015   HCT 36.6 09/25/2015   MCV 92.0 09/25/2015   PLT 216.0 09/25/2015   Lab Results  Component Value Date   NA 142 09/25/2015   K 4.7 09/25/2015   CO2 30 09/25/2015   GLUCOSE 86 09/25/2015   BUN 18 09/25/2015   CREATININE 0.90 09/25/2015   BILITOT 0.3 09/25/2015   ALKPHOS 49 09/25/2015   AST 27 09/25/2015   ALT 18 09/25/2015   PROT 6.0 09/25/2015   ALBUMIN 3.7 09/25/2015   CALCIUM  9.1 09/25/2015   GFR 65.37 09/25/2015   Lab Results  Component Value Date   CHOL 176 09/25/2015   Lab Results  Component Value Date   HDL 69.70 09/25/2015   Lab Results  Component Value Date   LDLCALC 96 09/25/2015   Lab Results  Component Value Date   TRIG 53.0 09/25/2015   Lab Results  Component Value Date   CHOLHDL 3 09/25/2015   No results found for: HGBA1C     Assessment & Plan:  Problem List Items Addressed This Visit    Thyroid disease    Feels well, no changes to doses for now will continue to monitor      Relevant Medications   thyroid (ARMOUR THYROID) 15 MG tablet   Osteopenia    Exercises regularly, vitamin D and calcium intake encouraged.       Medicare annual wellness visit, subsequent    Patient denies any difficulties at home. No trouble with ADLs, depression or falls. No recent changes to vision or hearing. Is UTD with immunizations. Is UTD with screening. Discussed Advanced Directives, patient agrees to bring [place] copies of documents if can. Encouraged heart healthy diet, exercise as tolerated and adequate sleep  Flu vaccine-- Refused Tdap--Refused PNA--Refused Shingles--Refused   Pap-- N/A  MMG-- Due 01/2017  Bone Density-- Completed  CCS--Due 12/2020  Care Teams Updated: D. Howes AccupunctureAdvised Advised  See problem list for risk factors.  See AVS for recommended preventative health schedule      Hyperlipidemia, mild    Encouraged heart healthy diet, increase exercise, avoid trans fats, consider a krill oil cap daily      Elevated BP    Well controlled Encouraged heart healthy diet such as the DASH diet and exercise as tolerated.        Other Visit Diagnoses    Hypothyroidism, unspecified hypothyroidism type    -  Primary    Relevant Medications    thyroid (ARMOUR THYROID) 15 MG tablet    Other Relevant Orders    T4, free    T3, free    TSH    T4, free    TSH    T3, free    Postmenopausal estrogen deficiency         Relevant Orders    DG Bone Density    Vitamin D (25 hydroxy)    Osteoporosis        Relevant Orders    Vitamin D (25 hydroxy)       I have discontinued Ms. Gutierrez's EPINEPHrine and ARMOUR THYROID. I am also having her maintain her vitamin C, NONFORMULARY OR COMPOUNDED ITEM, and thyroid.  Meds ordered this encounter  Medications  . DISCONTD: thyroid (ARMOUR THYROID) 15 MG tablet    Sig: 1 tab po daily 5 days a week and 2 tabs po daily on Tues and Sat.    Dispense:  40 tablet    Refill:  5  . thyroid (ARMOUR THYROID) 15 MG tablet    Sig: 1 tab po daily 5 days a week and 2 tabs po daily on Tues and Sat.    Dispense:  120 tablet    Refill:  1     Penni Homans, MD

## 2015-10-08 NOTE — Assessment & Plan Note (Signed)
Exercises regularly, vitamin D and calcium intake encouraged.

## 2015-10-13 ENCOUNTER — Ambulatory Visit (HOSPITAL_BASED_OUTPATIENT_CLINIC_OR_DEPARTMENT_OTHER)
Admission: RE | Admit: 2015-10-13 | Discharge: 2015-10-13 | Disposition: A | Discharge status: Home / Self Care | Payer: Medicare PPO | Source: Ambulatory Visit | Attending: Family Medicine | Admitting: Family Medicine

## 2015-10-13 DIAGNOSIS — M810 Age-related osteoporosis without current pathological fracture: Secondary | ICD-10-CM | POA: Insufficient documentation

## 2015-10-13 DIAGNOSIS — M8580 Other specified disorders of bone density and structure, unspecified site: Secondary | ICD-10-CM | POA: Diagnosis not present

## 2015-10-13 DIAGNOSIS — Z780 Asymptomatic menopausal state: Secondary | ICD-10-CM | POA: Insufficient documentation

## 2015-12-20 ENCOUNTER — Other Ambulatory Visit: Payer: Self-pay | Admitting: Family Medicine

## 2016-02-14 ENCOUNTER — Encounter: Payer: Self-pay | Admitting: Family Medicine

## 2016-02-19 ENCOUNTER — Other Ambulatory Visit: Payer: Self-pay | Admitting: Family Medicine

## 2016-02-19 ENCOUNTER — Other Ambulatory Visit (INDEPENDENT_AMBULATORY_CARE_PROVIDER_SITE_OTHER): Payer: Medicare Other

## 2016-02-19 DIAGNOSIS — E039 Hypothyroidism, unspecified: Secondary | ICD-10-CM

## 2016-02-19 DIAGNOSIS — Z780 Asymptomatic menopausal state: Secondary | ICD-10-CM | POA: Diagnosis not present

## 2016-02-19 DIAGNOSIS — M810 Age-related osteoporosis without current pathological fracture: Secondary | ICD-10-CM

## 2016-02-19 LAB — T4, FREE: Free T4: 3.68 ng/dL — ABNORMAL HIGH (ref 0.60–1.60)

## 2016-02-19 LAB — VITAMIN D 25 HYDROXY (VIT D DEFICIENCY, FRACTURES): VITD: 49.37 ng/mL (ref 30.00–100.00)

## 2016-02-19 LAB — T3, FREE: T3, Free: 6.2 pg/mL — ABNORMAL HIGH (ref 2.3–4.2)

## 2016-02-19 LAB — TSH: TSH: 0.03 u[IU]/mL — ABNORMAL LOW (ref 0.35–4.50)

## 2016-04-05 ENCOUNTER — Ambulatory Visit (INDEPENDENT_AMBULATORY_CARE_PROVIDER_SITE_OTHER): Payer: Medicare Other | Admitting: Family Medicine

## 2016-04-05 ENCOUNTER — Encounter: Payer: Self-pay | Admitting: Family Medicine

## 2016-04-05 VITALS — BP 108/64 | HR 61 | Temp 98.1°F | Ht 64.0 in | Wt 130.1 lb

## 2016-04-05 DIAGNOSIS — E079 Disorder of thyroid, unspecified: Secondary | ICD-10-CM | POA: Diagnosis not present

## 2016-04-05 DIAGNOSIS — R030 Elevated blood-pressure reading, without diagnosis of hypertension: Secondary | ICD-10-CM

## 2016-04-05 DIAGNOSIS — K5901 Slow transit constipation: Secondary | ICD-10-CM

## 2016-04-05 DIAGNOSIS — E785 Hyperlipidemia, unspecified: Secondary | ICD-10-CM | POA: Diagnosis not present

## 2016-04-05 DIAGNOSIS — M8580 Other specified disorders of bone density and structure, unspecified site: Secondary | ICD-10-CM | POA: Diagnosis not present

## 2016-04-05 DIAGNOSIS — IMO0001 Reserved for inherently not codable concepts without codable children: Secondary | ICD-10-CM

## 2016-04-05 DIAGNOSIS — Z0000 Encounter for general adult medical examination without abnormal findings: Secondary | ICD-10-CM | POA: Diagnosis not present

## 2016-04-05 NOTE — Assessment & Plan Note (Signed)
Encouraged to get adequate exercise, calcium and vitamin d intake 

## 2016-04-05 NOTE — Patient Instructions (Signed)
Hyperthyroidism Hyperthyroidism is when the thyroid is too active (overactive). Your thyroid is a large gland that is located in your neck. The thyroid helps to control how your body uses food (metabolism). When your thyroid is overactive, it produces too much of a hormone called thyroxine.  CAUSES Causes of hyperthyroidism may include:  Graves disease. This is when your immune system attacks the thyroid gland. This is the most common cause.  Inflammation of the thyroid gland.  Tumor in the thyroid gland or somewhere else.  Excessive use of thyroid medicines, including:  Prescription thyroid supplement.  Herbal supplements that mimic thyroid hormones.  Solid or fluid-filled lumps within your thyroid gland (thyroid nodules).  Excessive ingestion of iodine. RISK FACTORS  Being female.  Having a family history of thyroid conditions. SIGNS AND SYMPTOMS Signs and symptoms of hyperthyroidism may include:  Nervousness.  Inability to tolerate heat.  Unexplained weight loss.  Diarrhea.  Change in the texture of hair or skin.  Heart skipping beats or making extra beats.  Rapid heart rate.  Loss of menstruation.  Shaky hands.  Fatigue.  Restlessness.  Increased appetite.  Sleep problems.  Enlarged thyroid gland or nodules. DIAGNOSIS  Diagnosis of hyperthyroidism may include:  Medical history and physical exam.  Blood tests.  Ultrasound tests. TREATMENT Treatment may include:  Medicines to control your thyroid.  Surgery to remove your thyroid.  Radiation therapy. HOME CARE INSTRUCTIONS   Take medicines only as directed by your health care provider.  Do not use any tobacco products, including cigarettes, chewing tobacco, or electronic cigarettes. If you need help quitting, ask your health care provider.  Do not exercise or do physical activity until your health care provider approves.  Keep all follow-up appointments as directed by your health care  provider. This is important. SEEK MEDICAL CARE IF:  Your symptoms do not get better with treatment.  You have fever.  You are taking thyroid replacement medicine and you:  Have depression.  Feel mentally and physically slow.  Have weight gain. SEEK IMMEDIATE MEDICAL CARE IF:   You have decreased alertness or a change in your awareness.  You have abdominal pain.  You feel dizzy.  You have a rapid heartbeat.  You have an irregular heartbeat.   This information is not intended to replace advice given to you by your health care provider. Make sure you discuss any questions you have with your health care provider.   Document Released: 11/18/2005 Document Revised: 12/09/2014 Document Reviewed: 04/05/2014 Elsevier Interactive Patient Education 2016 [employer]  

## 2016-04-05 NOTE — Assessment & Plan Note (Signed)
Encouraged heart healthy diet, increase exercise, avoid trans fats, consider a krill oil cap daily 

## 2016-04-05 NOTE — Assessment & Plan Note (Signed)
Well controlled, no changes to meds. Encouraged heart healthy diet such as the DASH diet and exercise as tolerated.  °

## 2016-04-05 NOTE — Progress Notes (Signed)
Subjective:    Patient ID: Angela Buck, female    DOB: 12-02-1941, 75 y.o.   MRN: [id number]  Chief Complaint  Patient presents with  . Follow-up    HPI Patient is in today for follow up.  Patient reports that she is actively exercising. She is doing well. No recent illness. They do follow with a naturopath and some supplements were recently changed and she had some ankle swelling but that has resolved at this time. Denies CP/palp/SOB/HA/congestion/fevers/GI or GU c/o. Taking meds as prescribed    Past Medical History  Diagnosis Date  . Chicken pox as a child  . Measles as a child  . 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 History of mumps   . Colonic polyp 01/05/2012  . Skin cancer 12/02/2009  . Osteopenia 01/05/2012  . Preventative health care 01/05/2012  . Mass of axilla 01/05/2012  . Elevated BP 01/05/2012  . Abdominal pain 03/16/2012  . Fatigue 03/16/2012  . Thyroid disease 03/16/2012    right   . Other and unspecified hyperlipidemia 05/18/2014  . Leukopenia 05/18/2014  . Medicare annual wellness visit, subsequent 01/05/2012    Sees Dr Collene Mares of Gastroenterology     Past Surgical History  Procedure Laterality Date  . Right hip replacement  2003  . Biopsy of jawline  2011  . Laproscopy  1970    abdominal, endometriosis, with D&C  . Tonsillectomy and adenoidectomy  1949  . Cataract extraction, bilateral      b/l    Family History  Problem Relation Age of Onset  . Arthritis Mother   . Hypertension Mother   . Dementia Mother   . Heart attack Mother   . Glaucoma Mother   . Heart disease Mother     MI, stent in 2004  . Emphysema Father     smoker  . Hypertension Father   . Glaucoma Father   . COPD Father   . Other Paternal Grandfather     enlarged heart  . Ulcers Maternal Grandmother     Social History   Social History  . Marital Status: Divorced    Spouse Name: N/A  . Number of Children: N/A  . Years of Education: N/A   Occupational History    . Not on file.   Social History Main Topics  . Smoking status: Never Smoker   . Smokeless tobacco: Never Used  . Alcohol Use: Yes     Comment: occasional wine  . Drug Use: No  . Sexual Activity:    Partners: Female     Comment: lives with partner, no major dietary restrictions.    Other Topics Concern  . Not on file   Social History Narrative    Outpatient Prescriptions Prior to Visit  Medication Sig Dispense Refill  . ARMOUR THYROID 15 MG tablet TAKE 1 TABLET BY MOUTH EVERY DAY (Patient taking differently: take 1 tablet by mouth every day) 30 tablet 6  . Ascorbic Acid (VITAMIN C) 1000 MG tablet Take 1,000 mg by mouth 2 (two) times daily.    . NONFORMULARY OR COMPOUNDED ITEM Ioderal- 5 days a week    . thyroid (ARMOUR THYROID) 15 MG tablet 1 tab po daily 5 days a week and 2 tabs po daily on Tues and Sat. (Patient taking differently: Take 2 tablets by mouth one day a week and all other days take 1 tablet daily) 120 tablet 1   No facility-administered medications prior to visit.  Allergies  Allergen Reactions  . Bee Venom   . Penicillins Hives    Review of Systems  Constitutional: Negative for fever and malaise/fatigue.  HENT: Negative for congestion.   Eyes: Negative for blurred vision.  Respiratory: Negative for shortness of breath.   Cardiovascular: Negative for chest pain, palpitations and leg swelling.  Gastrointestinal: Negative for nausea, abdominal pain and blood in stool.  Genitourinary: Negative for dysuria and frequency.  Musculoskeletal: Negative for falls.  Skin: Negative for rash.  Neurological: Negative for dizziness, loss of consciousness and headaches.  Endo/Heme/Allergies: Negative for environmental allergies.  Psychiatric/Behavioral: Negative for depression. The patient is not nervous/anxious.        Objective:    Physical Exam  Constitutional: She is oriented to person, place, and time. She appears well-developed and well-nourished. No  distress.  HENT:  Head: Normocephalic and atraumatic.  Eyes: Conjunctivae are normal.  Neck: Neck supple. No thyromegaly present.  Cardiovascular: Normal rate, regular rhythm and normal heart sounds.   No murmur heard. Pulmonary/Chest: Effort normal and breath sounds normal. No respiratory distress.  Abdominal: Soft. Bowel sounds are normal. She exhibits no distension and no mass. There is no tenderness.  Musculoskeletal: She exhibits no edema.  Lymphadenopathy:    She has no cervical adenopathy.  Neurological: She is alert and oriented to person, place, and time.  Skin: Skin is warm and dry.  Psychiatric: She has a normal mood and affect. Her behavior is normal.    BP 108/64 mmHg  Pulse 61  Temp(Src) 98.1 F (36.7 C) (Oral)  Ht 5\' 4"  (1.626 m)  Wt 130 lb 2 oz (59.024 kg)  BMI 22.32 kg/m2  SpO2 95% Wt Readings from Last 3 Encounters:  04/05/16 130 lb 2 oz (59.024 kg)  09/29/15 132 lb (59.875 kg)  03/24/15 129 lb (58.514 kg)     Lab Results  Component Value Date   WBC 4.3 09/25/2015   HGB 12.1 09/25/2015   HCT 36.6 09/25/2015   PLT 216.0 09/25/2015   GLUCOSE 86 09/25/2015   CHOL 176 09/25/2015   TRIG 53.0 09/25/2015   HDL 69.70 09/25/2015   LDLCALC 96 09/25/2015   ALT 18 09/25/2015   AST 27 09/25/2015   NA 142 09/25/2015   K 4.7 09/25/2015   CL 106 09/25/2015   CREATININE 0.90 09/25/2015   BUN 18 09/25/2015   CO2 30 09/25/2015   TSH 0.03* 02/19/2016    Lab Results  Component Value Date   TSH 0.03* 02/19/2016   Lab Results  Component Value Date   WBC 4.3 09/25/2015   HGB 12.1 09/25/2015   HCT 36.6 09/25/2015   MCV 92.0 09/25/2015   PLT 216.0 09/25/2015   Lab Results  Component Value Date   NA 142 09/25/2015   K 4.7 09/25/2015   CO2 30 09/25/2015   GLUCOSE 86 09/25/2015   BUN 18 09/25/2015   CREATININE 0.90 09/25/2015   BILITOT 0.3 09/25/2015   ALKPHOS 49 09/25/2015   AST 27 09/25/2015   ALT 18 09/25/2015   PROT 6.0 09/25/2015   ALBUMIN 3.7  09/25/2015   CALCIUM 9.1 09/25/2015   GFR 65.37 09/25/2015   Lab Results  Component Value Date   CHOL 176 09/25/2015   Lab Results  Component Value Date   HDL 69.70 09/25/2015   Lab Results  Component Value Date   LDLCALC 96 09/25/2015   Lab Results  Component Value Date   TRIG 53.0 09/25/2015   Lab Results  Component Value Date  CHOLHDL 3 09/25/2015   No results found for: HGBA1C     Assessment & Plan:   Problem List Items Addressed This Visit    Thyroid disease - Primary    Tolerating Armour Thyroid      Relevant Orders   TSH   T4, free   T3, free   Vitamin D (25 hydroxy)   TSH   T4, free   T3, free   CBC   Comprehensive metabolic panel   Lipid panel   Osteopenia    Encouraged to get adequate exercise, calcium and vitamin d intake      Relevant Orders   TSH   T4, free   T3, free   Vitamin D (25 hydroxy)   TSH   T4, free   T3, free   CBC   Comprehensive metabolic panel   Lipid panel   Hyperlipidemia, mild    Encouraged heart healthy diet, increase exercise, avoid trans fats, consider a krill oil cap daily      Relevant Orders   TSH   T4, free   T3, free   Vitamin D (25 hydroxy)   TSH   T4, free   T3, free   CBC   Comprehensive metabolic panel   Lipid panel   Elevated BP    Well controlled, no changes to meds. Encouraged heart healthy diet such as the DASH diet and exercise as tolerated.       Constipation by delayed colonic transit    Encouraged increased hydration and fiber in diet. Daily probiotics. If bowels not moving can use MOM 2 tbls po in 4 oz of warm prune juice by mouth every 2-3 days. If no results then repeat in 4 hours with  Dulcolax suppository pr, may repeat again in 4 more hours as needed. Seek care if symptoms worsen. Consider daily Miralax and/or Dulcolax if symptoms persist.        Other Visit Diagnoses    Preventative health care        Relevant Orders    TSH    T4, free    T3, free    Vitamin D (25  hydroxy)    TSH    T4, free    T3, free    CBC    Comprehensive metabolic panel    Lipid panel       I have discontinued Ms. Dall's thyroid. I am also having her maintain her vitamin C, NONFORMULARY OR COMPOUNDED ITEM, and ARMOUR THYROID.  No orders of the defined types were placed in this encounter.     Penni Homans, MD

## 2016-04-05 NOTE — Assessment & Plan Note (Signed)
Encouraged increased hydration and fiber in diet. Daily probiotics. If bowels not moving can use MOM 2 tbls po in 4 oz of warm prune juice by mouth every 2-3 days. If no results then repeat in 4 hours with  Dulcolax suppository pr, may repeat again in 4 more hours as needed. Seek care if symptoms worsen. Consider daily Miralax and/or Dulcolax if symptoms persist.  

## 2016-04-05 NOTE — Assessment & Plan Note (Signed)
Tolerating Armour Thyroid

## 2016-04-05 NOTE — Progress Notes (Signed)
Pre visit review using our clinic review tool, if applicable. No additional management support is needed unless otherwise documented below in the visit note. 

## 2016-04-07 ENCOUNTER — Encounter (HOSPITAL_COMMUNITY): Payer: Self-pay | Admitting: *Deleted

## 2016-04-07 ENCOUNTER — Inpatient Hospital Stay (HOSPITAL_COMMUNITY)
Admission: AD | Admit: 2016-04-07 | Discharge: 2016-04-07 | Disposition: A | Discharge status: Home / Self Care | Payer: Medicare Other | Source: Ambulatory Visit | Attending: Obstetrics & Gynecology | Admitting: Obstetrics & Gynecology

## 2016-04-07 DIAGNOSIS — L918 Other hypertrophic disorders of the skin: Secondary | ICD-10-CM | POA: Diagnosis not present

## 2016-04-07 DIAGNOSIS — Z86010 Personal history of colonic polyps: Secondary | ICD-10-CM | POA: Insufficient documentation

## 2016-04-07 DIAGNOSIS — Z880 Allergy status to penicillin: Secondary | ICD-10-CM | POA: Insufficient documentation

## 2016-04-07 DIAGNOSIS — N907 Vulvar cyst: Secondary | ICD-10-CM

## 2016-04-07 DIAGNOSIS — Z96641 Presence of right artificial hip joint: Secondary | ICD-10-CM | POA: Diagnosis not present

## 2016-04-07 DIAGNOSIS — Z79899 Other long term (current) drug therapy: Secondary | ICD-10-CM | POA: Insufficient documentation

## 2016-04-07 DIAGNOSIS — L989 Disorder of the skin and subcutaneous tissue, unspecified: Secondary | ICD-10-CM | POA: Diagnosis present

## 2016-04-07 DIAGNOSIS — Z85828 Personal history of other malignant neoplasm of skin: Secondary | ICD-10-CM | POA: Diagnosis not present

## 2016-04-07 DIAGNOSIS — Z91030 Bee allergy status: Secondary | ICD-10-CM | POA: Insufficient documentation

## 2016-04-07 DIAGNOSIS — N9089 Other specified noninflammatory disorders of vulva and perineum: Secondary | ICD-10-CM

## 2016-04-07 DIAGNOSIS — E079 Disorder of thyroid, unspecified: Secondary | ICD-10-CM | POA: Diagnosis not present

## 2016-04-07 NOTE — MAU Provider Note (Signed)
History     CSN: [id number]  Arrival date and time: 04/07/16 1226   None     Chief Complaint  Patient presents with  . Tick    HPI Angela Buck 75 y.o. [id number] presents to the MAU stating that she thinks she has a tick on her vagina.  Past Medical History  Diagnosis Date  . Chicken pox as a child  . Measles as a child  . 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 History of mumps   . Colonic polyp 01/05/2012  . Skin cancer 12/02/2009  . Osteopenia 01/05/2012  . Preventative health care 01/05/2012  . Mass of axilla 01/05/2012  . Elevated BP 01/05/2012  . Abdominal pain 03/16/2012  . Fatigue 03/16/2012  . Thyroid disease 03/16/2012    right   . Other and unspecified hyperlipidemia 05/18/2014  . Leukopenia 05/18/2014  . Medicare annual wellness visit, subsequent 01/05/2012    Sees Dr Collene Mares of Gastroenterology     Past Surgical History  Procedure Laterality Date  . Right hip replacement  2003  . Biopsy of jawline  2011  . Laproscopy  1970    abdominal, endometriosis, with D&C  . Tonsillectomy and adenoidectomy  1949  . Cataract extraction, bilateral      b/l    Family History  Problem Relation Age of Onset  . Arthritis Mother   . Hypertension Mother   . Dementia Mother   . Heart attack Mother   . Glaucoma Mother   . Heart disease Mother     MI, stent in 2004  . Emphysema Father     smoker  . Hypertension Father   . Glaucoma Father   . COPD Father   . Other Paternal Grandfather     enlarged heart  . Ulcers Maternal Grandmother     Social History  Substance Use Topics  . Smoking status: Never Smoker   . Smokeless tobacco: Never Used  . Alcohol Use: Yes     Comment: occasional wine    Allergies:  Allergies  Allergen Reactions  . Bee Venom Anaphylaxis  . Penicillins Hives    Has patient had a PCN reaction causing immediate rash, facial/tongue/throat swelling, SOB or lightheadedness with hypotension: No Has patient had a PCN reaction causing severe  rash involving mucus membranes or skin necrosis: No Has patient had a PCN reaction that required hospitalization No Has patient had a PCN reaction occurring within the last 10 years: No If all of the above answers are "NO", then may proceed with Cephalosporin use.    Prescriptions prior to admission  Medication Sig Dispense Refill Last Dose  . ARMOUR THYROID 15 MG tablet TAKE 1 TABLET BY MOUTH EVERY DAY (Patient taking differently: take 1 tablet by mouth every day) 30 tablet 6 04/07/2016 at Unknown time    Review of Systems  Constitutional: Negative for fever.  Skin:       Crusty area on outside of left side of vagina  All other systems reviewed and are negative.  Physical Exam   Blood pressure 139/55, pulse 72, temperature 97.5 F (36.4 C), temperature source Oral, resp. rate 16.  Physical Exam  Nursing note and vitals reviewed. Constitutional: She is oriented to person, place, and time. She appears well-developed and well-nourished. No distress.  HENT:  Head: Normocephalic and atraumatic.  Cardiovascular: Normal rate and regular rhythm.   Respiratory: Effort normal. No respiratory distress.  Genitourinary:  Small skin tag on inside left  labial area  Neurological: She is alert and oriented to person, place, and time.  Skin: Skin is warm and dry. No rash noted. No erythema. No pallor.  Psychiatric: She has a normal mood and affect. Her behavior is normal. Thought content normal.    MAU Course  Procedures  MDM Skin tag visible left labia minora. Advised patient to follow up with general practitioner if she continues to be bothered by it. Reassurred. Pt is agreeable to POC.  Assessment and Plan  Skin tag F/U with GP prn Discharge to home   [facility] [place] 04/07/2016, 2:12 PM

## 2016-04-07 NOTE — Discharge Instructions (Signed)
Skin Care: Checking Skin    -Check skin daily for signs of inadequate circulation, skin breakdown, or infection. -Inspect skin in these areas carefully: Heels Groin Hips Along spine Armpits Elbows Buttocks and tailbone ______. -Press gently on pink areas - area should turn white where pressed and return to pink color immediately. -Make sure skin is dry and without blisters, cracks, or areas that remain red when pressure is off. -If areas of concern are noted, report them right away to: ______.   Copyright  [employer]. All rights reserved.

## 2016-04-07 NOTE — MAU Note (Signed)
Pt states she thinks she has a tick on the left vaginal lip.  Pt states she worked in the garden Wednesday so she is not sure how long the tick has been there.

## 2016-09-01 ENCOUNTER — Other Ambulatory Visit: Payer: Self-pay | Admitting: Family Medicine

## 2016-09-30 ENCOUNTER — Encounter: Payer: Self-pay | Admitting: Family Medicine

## 2016-10-07 ENCOUNTER — Ambulatory Visit (INDEPENDENT_AMBULATORY_CARE_PROVIDER_SITE_OTHER): Payer: Medicare Other | Admitting: Family Medicine

## 2016-10-07 ENCOUNTER — Encounter: Payer: Self-pay | Admitting: Family Medicine

## 2016-10-07 DIAGNOSIS — D709 Neutropenia, unspecified: Secondary | ICD-10-CM | POA: Insufficient documentation

## 2016-10-07 DIAGNOSIS — K5901 Slow transit constipation: Secondary | ICD-10-CM | POA: Diagnosis not present

## 2016-10-07 DIAGNOSIS — E785 Hyperlipidemia, unspecified: Secondary | ICD-10-CM

## 2016-10-07 DIAGNOSIS — E079 Disorder of thyroid, unspecified: Secondary | ICD-10-CM

## 2016-10-07 DIAGNOSIS — M8580 Other specified disorders of bone density and structure, unspecified site: Secondary | ICD-10-CM | POA: Diagnosis not present

## 2016-10-07 DIAGNOSIS — Z0000 Encounter for general adult medical examination without abnormal findings: Secondary | ICD-10-CM | POA: Diagnosis not present

## 2016-10-07 HISTORY — DX: Neutropenia, unspecified: D70.9

## 2016-10-07 LAB — TSH: TSH: 11.03 u[IU]/mL — ABNORMAL HIGH (ref 0.35–4.50)

## 2016-10-07 LAB — COMPREHENSIVE METABOLIC PANEL
ALT: 19 U/L (ref 0–35)
AST: 27 U/L (ref 0–37)
Albumin: 4.1 g/dL (ref 3.5–5.2)
Alkaline Phosphatase: 62 U/L (ref 39–117)
BUN: 18 mg/dL (ref 6–23)
CO2: 33 mEq/L — ABNORMAL HIGH (ref 19–32)
Calcium: 9.9 mg/dL (ref 8.4–10.5)
Chloride: 104 mEq/L (ref 96–112)
Creatinine, Ser: 1 mg/dL (ref 0.40–1.20)
GFR: 57.72 mL/min — ABNORMAL LOW (ref >60.00)
Glucose, Bld: 87 mg/dL (ref 70–99)
Potassium: 4.4 mEq/L (ref 3.5–5.1)
Sodium: 141 mEq/L (ref 135–145)
Total Bilirubin: 0.5 mg/dL (ref 0.2–1.2)
Total Protein: 6.8 g/dL (ref 6.0–8.3)

## 2016-10-07 LAB — LIPID PANEL
Cholesterol: 177 mg/dL (ref 0–200)
HDL: 80.4 mg/dL (ref >39.00)
LDL Cholesterol: 77 mg/dL (ref 0–99)
NonHDL: 96.58
Total CHOL/HDL Ratio: 2
Triglycerides: 96 mg/dL (ref 0.0–149.0)
VLDL: 19.2 mg/dL (ref 0.0–40.0)

## 2016-10-07 LAB — CBC
HCT: 37.7 % (ref 36.0–46.0)
Hemoglobin: 12.6 g/dL (ref 12.0–15.0)
MCHC: 33.3 g/dL (ref 30.0–36.0)
MCV: 91.7 fl (ref 78.0–100.0)
Platelets: 256 10*3/uL (ref 150.0–400.0)
RBC: 4.11 Mil/uL (ref 3.87–5.11)
RDW: 13.3 % (ref 11.5–15.5)
WBC: 4.7 10*3/uL (ref 4.0–10.5)

## 2016-10-07 LAB — T4, FREE: Free T4: 0.69 ng/dL (ref 0.60–1.60)

## 2016-10-07 MED ORDER — THYROID 15 MG PO TABS: 15.0000 mg | ORAL_TABLET | Freq: Every day | ORAL | 1 refills | Status: DC

## 2016-10-07 NOTE — Assessment & Plan Note (Signed)
Check CBC 

## 2016-10-07 NOTE — Assessment & Plan Note (Signed)
Tolerating Armour Thyroid

## 2016-10-07 NOTE — Assessment & Plan Note (Signed)
Patient encouraged to maintain heart healthy diet, regular exercise, adequate sleep. Consider daily probiotics. Take medications as prescribed. Given and reviewed copy of ACP documents from [place] Secretary of State and encouraged to complete and return 

## 2016-10-07 NOTE — Assessment & Plan Note (Signed)
Encouraged to get adequate exercise, calcium and vitamin d intake 

## 2016-10-07 NOTE — Patient Instructions (Signed)
Preventive Care for Adults, Female A healthy lifestyle and preventive care can promote health and wellness. Preventive health guidelines for women include the following key practices.  A routine yearly physical is a good way to check with your health care provider about your health and preventive screening. It is a chance to share any concerns and updates on your health and to receive a thorough exam.  Visit your dentist for a routine exam and preventive care every 6 months. Brush your teeth twice a day and floss once a day. Good oral hygiene prevents tooth decay and gum disease.  The frequency of eye exams is based on your age, health, family medical history, use of contact lenses, and other factors. Follow your health care provider's recommendations for frequency of eye exams.  Eat a healthy diet. Foods like vegetables, fruits, whole grains, low-fat dairy products, and lean protein foods contain the nutrients you need without too many calories. Decrease your intake of foods high in solid fats, added sugars, and salt. Eat the right amount of calories for you.Get information about a proper diet from your health care provider, if necessary.  Regular physical exercise is one of the most important things you can do for your health. Most adults should get at least 150 minutes of moderate-intensity exercise (any activity that increases your heart rate and causes you to sweat) each week. In addition, most adults need muscle-strengthening exercises on 2 or more days a week.  Maintain a healthy weight. The body mass index (BMI) is a screening tool to identify possible weight problems. It provides an estimate of body fat based on height and weight. Your health care provider can find your BMI and can help you achieve or maintain a healthy weight.For adults 20 years and older:  A BMI below 18.5 is considered underweight.  A BMI of 18.5 to 24.9 is normal.  A BMI of 25 to 29.9 is considered overweight.  A  BMI of 30 and above is considered obese.  Maintain normal blood lipids and cholesterol levels by exercising and minimizing your intake of saturated fat. Eat a balanced diet with plenty of fruit and vegetables. Blood tests for lipids and cholesterol should begin at age 45 and be repeated every 5 years. If your lipid or cholesterol levels are high, you are over 50, or you are at high risk for heart disease, you may need your cholesterol levels checked more frequently.Ongoing high lipid and cholesterol levels should be treated with medicines if diet and exercise are not working.  If you smoke, find out from your health care provider how to quit. If you do not use tobacco, do not start.  Lung cancer screening is recommended for adults aged 45-80 years who are at high risk for developing lung cancer because of a history of smoking. A yearly low-dose CT scan of the lungs is recommended for people who have at least a 30-pack-year history of smoking and are a current smoker or have quit within the past 15 years. A pack year of smoking is smoking an average of 1 pack of cigarettes a day for 1 year (for example: 1 pack a day for 30 years or 2 packs a day for 15 years). Yearly screening should continue until the smoker has stopped smoking for at least 15 years. Yearly screening should be stopped for people who develop a health problem that would prevent them from having lung cancer treatment.  If you are pregnant, do not drink alcohol. If you are  breastfeeding, be very cautious about drinking alcohol. If you are not pregnant and choose to drink alcohol, do not have more than 1 drink per day. One drink is considered to be 12 ounces (355 mL) of beer, 5 ounces (148 mL) of wine, or 1.5 ounces (44 mL) of liquor.  Avoid use of street drugs. Do not share needles with anyone. Ask for help if you need support or instructions about stopping the use of drugs.  High blood pressure causes heart disease and increases the risk  of stroke. Your blood pressure should be checked at least every 1 to 2 years. Ongoing high blood pressure should be treated with medicines if weight loss and exercise do not work.  If you are 55-79 years old, ask your health care provider if you should take aspirin to prevent strokes.  Diabetes screening is done by taking a blood sample to check your blood glucose level after you have not eaten for a certain period of time (fasting). If you are not overweight and you do not have risk factors for diabetes, you should be screened once every 3 years starting at age 45. If you are overweight or obese and you are 40-70 years of age, you should be screened for diabetes every year as part of your cardiovascular risk assessment.  Breast cancer screening is essential preventive care for women. You should practice "breast self-awareness." This means understanding the normal appearance and feel of your breasts and may include breast self-examination. Any changes detected, no matter how small, should be reported to a health care provider. Women in their 20s and 30s should have a clinical breast exam (CBE) by a health care provider as part of a regular health exam every 1 to 3 years. After age 40, women should have a CBE every year. Starting at age 40, women should consider having a mammogram (breast X-ray test) every year. Women who have a family history of breast cancer should talk to their health care provider about genetic screening. Women at a high risk of breast cancer should talk to their health care providers about having an MRI and a mammogram every year.  Breast cancer gene (BRCA)-related cancer risk assessment is recommended for women who have family members with BRCA-related cancers. BRCA-related cancers include breast, ovarian, tubal, and peritoneal cancers. Having family members with these cancers may be associated with an increased risk for harmful changes (mutations) in the breast cancer genes BRCA1 and  BRCA2. Results of the assessment will determine the need for genetic counseling and BRCA1 and BRCA2 testing.  Your health care provider may recommend that you be screened regularly for cancer of the pelvic organs (ovaries, uterus, and vagina). This screening involves a pelvic examination, including checking for microscopic changes to the surface of your cervix (Pap test). You may be encouraged to have this screening done every 3 years, beginning at age 21.  For women ages 30-65, health care providers may recommend pelvic exams and Pap testing every 3 years, or they may recommend the Pap and pelvic exam, combined with testing for human papilloma virus (HPV), every 5 years. Some types of HPV increase your risk of cervical cancer. Testing for HPV may also be done on women of any age with unclear Pap test results.  Other health care providers may not recommend any screening for nonpregnant women who are considered low risk for pelvic cancer and who do not have symptoms. Ask your health care provider if a screening pelvic exam is right for   you.  If you have had past treatment for cervical cancer or a condition that could lead to cancer, you need Pap tests and screening for cancer for at least 20 years after your treatment. If Pap tests have been discontinued, your risk factors (such as having a new sexual partner) need to be reassessed to determine if screening should resume. Some women have medical problems that increase the chance of getting cervical cancer. In these cases, your health care provider may recommend more frequent screening and Pap tests.  Colorectal cancer can be detected and often prevented. Most routine colorectal cancer screening begins at the age of 50 years and continues through age 75 years. However, your health care provider may recommend screening at an earlier age if you have risk factors for colon cancer. On a yearly basis, your health care provider may provide home test kits to check  for hidden blood in the stool. Use of a small camera at the end of a tube, to directly examine the colon (sigmoidoscopy or colonoscopy), can detect the earliest forms of colorectal cancer. Talk to your health care provider about this at age 50, when routine screening begins. Direct exam of the colon should be repeated every 5-10 years through age 75 years, unless early forms of precancerous polyps or small growths are found.  People who are at an increased risk for hepatitis B should be screened for this virus. You are considered at high risk for hepatitis B if:  You were born in a country where hepatitis B occurs often. Talk with your health care provider about which countries are considered high risk.  Your parents were born in a high-risk country and you have not received a shot to protect against hepatitis B (hepatitis B vaccine).  You have HIV or AIDS.  You use needles to inject street drugs.  You live with, or have sex with, someone who has hepatitis B.  You get hemodialysis treatment.  You take certain medicines for conditions like cancer, organ transplantation, and autoimmune conditions.  Hepatitis C blood testing is recommended for all people born from 1945 through 1965 and any individual with known risks for hepatitis C.  Practice safe sex. Use condoms and avoid high-risk sexual practices to reduce the spread of sexually transmitted infections (STIs). STIs include gonorrhea, chlamydia, syphilis, trichomonas, herpes, HPV, and human immunodeficiency virus (HIV). Herpes, HIV, and HPV are viral illnesses that have no cure. They can result in disability, cancer, and death.  You should be screened for sexually transmitted illnesses (STIs) including gonorrhea and chlamydia if:  You are sexually active and are younger than 24 years.  You are older than 24 years and your health care provider tells you that you are at risk for this type of infection.  Your sexual activity has changed  since you were last screened and you are at an increased risk for chlamydia or gonorrhea. Ask your health care provider if you are at risk.  If you are at risk of being infected with HIV, it is recommended that you take a prescription medicine daily to prevent HIV infection. This is called preexposure prophylaxis (PrEP). You are considered at risk if:  You are sexually active and do not regularly use condoms or know the HIV status of your partner(s).  You take drugs by injection.  You are sexually active with a partner who has HIV.  Talk with your health care provider about whether you are at high risk of being infected with HIV. If   you choose to begin PrEP, you should first be tested for HIV. You should then be tested every 3 months for as long as you are taking PrEP.  Osteoporosis is a disease in which the bones lose minerals and strength with aging. This can result in serious bone fractures or breaks. The risk of osteoporosis can be identified using a bone density scan. Women ages 67 years and over and women at risk for fractures or osteoporosis should discuss screening with their health care providers. Ask your health care provider whether you should take a calcium supplement or vitamin D to reduce the rate of osteoporosis.  Menopause can be associated with physical symptoms and risks. Hormone replacement therapy is available to decrease symptoms and risks. You should talk to your health care provider about whether hormone replacement therapy is right for you.  Use sunscreen. Apply sunscreen liberally and repeatedly throughout the day. You should seek shade when your shadow is shorter than you. Protect yourself by wearing long sleeves, pants, a wide-brimmed hat, and sunglasses year round, whenever you are outdoors.  Once a month, do a whole body skin exam, using a mirror to look at the skin on your back. Tell your health care provider of new moles, moles that have irregular borders, moles that  are larger than a pencil eraser, or moles that have changed in shape or color.  Stay current with required vaccines (immunizations).  Influenza vaccine. All adults should be immunized every year.  Tetanus, diphtheria, and acellular pertussis (Td, Tdap) vaccine. Pregnant women should receive 1 dose of Tdap vaccine during each pregnancy. The dose should be obtained regardless of the length of time since the last dose. Immunization is preferred during the 27th-36th week of gestation. An adult who has not previously received Tdap or who does not know her vaccine status should receive 1 dose of Tdap. This initial dose should be followed by tetanus and diphtheria toxoids (Td) booster doses every 10 years. Adults with an unknown or incomplete history of completing a 3-dose immunization series with Td-containing vaccines should begin or complete a primary immunization series including a Tdap dose. Adults should receive a Td booster every 10 years.  Varicella vaccine. An adult without evidence of immunity to varicella should receive 2 doses or a second dose if she has previously received 1 dose. Pregnant females who do not have evidence of immunity should receive the first dose after pregnancy. This first dose should be obtained before leaving the health care facility. The second dose should be obtained 4-8 weeks after the first dose.  Human papillomavirus (HPV) vaccine. Females aged 13-26 years who have not received the vaccine previously should obtain the 3-dose series. The vaccine is not recommended for use in pregnant females. However, pregnancy testing is not needed before receiving a dose. If a female is found to be pregnant after receiving a dose, no treatment is needed. In that case, the remaining doses should be delayed until after the pregnancy. Immunization is recommended for any person with an immunocompromised condition through the age of 61 years if she did not get any or all doses earlier. During the  3-dose series, the second dose should be obtained 4-8 weeks after the first dose. The third dose should be obtained 24 weeks after the first dose and 16 weeks after the second dose.  Zoster vaccine. One dose is recommended for adults aged 30 years or older unless certain conditions are present.  Measles, mumps, and rubella (MMR) vaccine. Adults born  before 1957 generally are considered immune to measles and mumps. Adults born in 1957 or later should have 1 or more doses of MMR vaccine unless there is a contraindication to the vaccine or there is laboratory evidence of immunity to each of the three diseases. A routine second dose of MMR vaccine should be obtained at least 28 days after the first dose for students attending postsecondary schools, health care workers, or international travelers. People who received inactivated measles vaccine or an unknown type of measles vaccine during 1963-1967 should receive 2 doses of MMR vaccine. People who received inactivated mumps vaccine or an unknown type of mumps vaccine before 1979 and are at high risk for mumps infection should consider immunization with 2 doses of MMR vaccine. For females of childbearing age, rubella immunity should be determined. If there is no evidence of immunity, females who are not pregnant should be vaccinated. If there is no evidence of immunity, females who are pregnant should delay immunization until after pregnancy. Unvaccinated health care workers born before 1957 who lack laboratory evidence of measles, mumps, or rubella immunity or laboratory confirmation of disease should consider measles and mumps immunization with 2 doses of MMR vaccine or rubella immunization with 1 dose of MMR vaccine.  Pneumococcal 13-valent conjugate (PCV13) vaccine. When indicated, a person who is uncertain of his immunization history and has no record of immunization should receive the PCV13 vaccine. All adults 65 years of age and older should receive this  vaccine. An adult aged 19 years or older who has certain medical conditions and has not been previously immunized should receive 1 dose of PCV13 vaccine. This PCV13 should be followed with a dose of pneumococcal polysaccharide (PPSV23) vaccine. Adults who are at high risk for pneumococcal disease should obtain the PPSV23 vaccine at least 8 weeks after the dose of PCV13 vaccine. Adults older than 73 years of age who have normal immune system function should obtain the PPSV23 vaccine dose at least 1 year after the dose of PCV13 vaccine.  Pneumococcal polysaccharide (PPSV23) vaccine. When PCV13 is also indicated, PCV13 should be obtained first. All adults aged 65 years and older should be immunized. An adult younger than age 65 years who has certain medical conditions should be immunized. Any person who resides in a nursing home or long-term care facility should be immunized. An adult smoker should be immunized. People with an immunocompromised condition and certain other conditions should receive both PCV13 and PPSV23 vaccines. People with human immunodeficiency virus (HIV) infection should be immunized as soon as possible after diagnosis. Immunization during chemotherapy or radiation therapy should be avoided. Routine use of PPSV23 vaccine is not recommended for American Indians, [place] Natives, or people younger than 65 years unless there are medical conditions that require PPSV23 vaccine. When indicated, people who have unknown immunization and have no record of immunization should receive PPSV23 vaccine. One-time revaccination 5 years after the first dose of PPSV23 is recommended for people aged 19-64 years who have chronic kidney failure, nephrotic syndrome, asplenia, or immunocompromised conditions. People who received 1-2 doses of PPSV23 before age 65 years should receive another dose of PPSV23 vaccine at age 65 years or later if at least 5 years have passed since the previous dose. Doses of PPSV23 are not  needed for people immunized with PPSV23 at or after age 65 years.  Meningococcal vaccine. Adults with asplenia or persistent complement component deficiencies should receive 2 doses of quadrivalent meningococcal conjugate (MenACWY-D) vaccine. The doses should be obtained   at least 2 months apart. Microbiologists working with certain meningococcal bacteria, [place] recruits, people at risk during an outbreak, and people who travel to or live in countries with a high rate of meningitis should be immunized. A first-year college student up through age 34 years who is living in a residence hall should receive a dose if she did not receive a dose on or after her 16th birthday. Adults who have certain high-risk conditions should receive one or more doses of vaccine.  Hepatitis A vaccine. Adults who wish to be protected from this disease, have certain high-risk conditions, work with hepatitis A-infected animals, work in hepatitis A research labs, or travel to or work in countries with a high rate of hepatitis A should be immunized. Adults who were previously unvaccinated and who anticipate close contact with an international adoptee during the first 60 days after arrival in the [place] from a country with a high rate of hepatitis A should be immunized.  Hepatitis B vaccine. Adults who wish to be protected from this disease, have certain high-risk conditions, may be exposed to blood or other infectious body fluids, are household contacts or sex partners of hepatitis B positive people, are clients or workers in certain care facilities, or travel to or work in countries with a high rate of hepatitis B should be immunized.  Haemophilus influenzae type b (Hib) vaccine. A previously unvaccinated person with asplenia or sickle cell disease or having a scheduled splenectomy should receive 1 dose of Hib vaccine. Regardless of previous immunization, a recipient of a hematopoietic stem cell transplant should receive a  3-dose series 6-12 months after her successful transplant. Hib vaccine is not recommended for adults with HIV infection. Preventive Services / Frequency Ages 35 to 4 years  Blood pressure check.** / Every 3-5 years.  Lipid and cholesterol check.** / Every 5 years beginning at age 60.  Clinical breast exam.** / Every 3 years for women in their 71s and 10s.  BRCA-related cancer risk assessment.** / For women who have family members with a BRCA-related cancer (breast, ovarian, tubal, or peritoneal cancers).  Pap test.** / Every 2 years from ages 76 through 26. Every 3 years starting at age 61 through age 76 or 93 with a history of 3 consecutive normal Pap tests.  HPV screening.** / Every 3 years from ages 37 through ages 60 to 51 with a history of 3 consecutive normal Pap tests.  Hepatitis C blood test.** / For any individual with known risks for hepatitis C.  Skin self-exam. / Monthly.  Influenza vaccine. / Every year.  Tetanus, diphtheria, and acellular pertussis (Tdap, Td) vaccine.** / Consult your health care provider. Pregnant women should receive 1 dose of Tdap vaccine during each pregnancy. 1 dose of Td every 10 years.  Varicella vaccine.** / Consult your health care provider. Pregnant females who do not have evidence of immunity should receive the first dose after pregnancy.  HPV vaccine. / 3 doses over 6 months, if 93 and younger. The vaccine is not recommended for use in pregnant females. However, pregnancy testing is not needed before receiving a dose.  Measles, mumps, rubella (MMR) vaccine.** / You need at least 1 dose of MMR if you were born in 1957 or later. You may also need a 2nd dose. For females of childbearing age, rubella immunity should be determined. If there is no evidence of immunity, females who are not pregnant should be vaccinated. If there is no evidence of immunity, females who are  pregnant should delay immunization until after pregnancy.  Pneumococcal  13-valent conjugate (PCV13) vaccine.** / Consult your health care provider.  Pneumococcal polysaccharide (PPSV23) vaccine.** / 1 to 2 doses if you smoke cigarettes or if you have certain conditions.  Meningococcal vaccine.** / 1 dose if you are age 68 to 8 years and a Market researcher living in a residence hall, or have one of several medical conditions, you need to get vaccinated against meningococcal disease. You may also need additional booster doses.  Hepatitis A vaccine.** / Consult your health care provider.  Hepatitis B vaccine.** / Consult your health care provider.  Haemophilus influenzae type b (Hib) vaccine.** / Consult your health care provider. Ages 7 to 53 years  Blood pressure check.** / Every year.  Lipid and cholesterol check.** / Every 5 years beginning at age 25 years.  Lung cancer screening. / Every year if you are aged 11-80 years and have a 30-pack-year history of smoking and currently smoke or have quit within the past 15 years. Yearly screening is stopped once you have quit smoking for at least 15 years or develop a health problem that would prevent you from having lung cancer treatment.  Clinical breast exam.** / Every year after age 48 years.  BRCA-related cancer risk assessment.** / For women who have family members with a BRCA-related cancer (breast, ovarian, tubal, or peritoneal cancers).  Mammogram.** / Every year beginning at age 41 years and continuing for as long as you are in good health. Consult with your health care provider.  Pap test.** / Every 3 years starting at age 65 years through age 37 or 70 years with a history of 3 consecutive normal Pap tests.  HPV screening.** / Every 3 years from ages 72 years through ages 60 to 40 years with a history of 3 consecutive normal Pap tests.  Fecal occult blood test (FOBT) of stool. / Every year beginning at age 21 years and continuing until age 5 years. You may not need to do this test if you get  a colonoscopy every 10 years.  Flexible sigmoidoscopy or colonoscopy.** / Every 5 years for a flexible sigmoidoscopy or every 10 years for a colonoscopy beginning at age 35 years and continuing until age 48 years.  Hepatitis C blood test.** / For all people born from [birth year] through 1965 and any individual with known risks for hepatitis C.  Skin self-exam. / Monthly.  Influenza vaccine. / Every year.  Tetanus, diphtheria, and acellular pertussis (Tdap/Td) vaccine.** / Consult your health care provider. Pregnant women should receive 1 dose of Tdap vaccine during each pregnancy. 1 dose of Td every 10 years.  Varicella vaccine.** / Consult your health care provider. Pregnant females who do not have evidence of immunity should receive the first dose after pregnancy.  Zoster vaccine.** / 1 dose for adults aged 30 years or older.  Measles, mumps, rubella (MMR) vaccine.** / You need at least 1 dose of MMR if you were born in 1957 or later. You may also need a second dose. For females of childbearing age, rubella immunity should be determined. If there is no evidence of immunity, females who are not pregnant should be vaccinated. If there is no evidence of immunity, females who are pregnant should delay immunization until after pregnancy.  Pneumococcal 13-valent conjugate (PCV13) vaccine.** / Consult your health care provider.  Pneumococcal polysaccharide (PPSV23) vaccine.** / 1 to 2 doses if you smoke cigarettes or if you have certain conditions.  Meningococcal vaccine.** /  Consult your health care provider.  Hepatitis A vaccine.** / Consult your health care provider.  Hepatitis B vaccine.** / Consult your health care provider.  Haemophilus influenzae type b (Hib) vaccine.** / Consult your health care provider. Ages 64 years and over  Blood pressure check.** / Every year.  Lipid and cholesterol check.** / Every 5 years beginning at age 23 years.  Lung cancer screening. / Every year if you  are aged 16-80 years and have a 30-pack-year history of smoking and currently smoke or have quit within the past 15 years. Yearly screening is stopped once you have quit smoking for at least 15 years or develop a health problem that would prevent you from having lung cancer treatment.  Clinical breast exam.** / Every year after age 74 years.  BRCA-related cancer risk assessment.** / For women who have family members with a BRCA-related cancer (breast, ovarian, tubal, or peritoneal cancers).  Mammogram.** / Every year beginning at age 44 years and continuing for as long as you are in good health. Consult with your health care provider.  Pap test.** / Every 3 years starting at age 58 years through age 22 or 39 years with 3 consecutive normal Pap tests. Testing can be stopped between 65 and 70 years with 3 consecutive normal Pap tests and no abnormal Pap or HPV tests in the past 10 years.  HPV screening.** / Every 3 years from ages 64 years through ages 70 or 61 years with a history of 3 consecutive normal Pap tests. Testing can be stopped between 65 and 70 years with 3 consecutive normal Pap tests and no abnormal Pap or HPV tests in the past 10 years.  Fecal occult blood test (FOBT) of stool. / Every year beginning at age 40 years and continuing until age 27 years. You may not need to do this test if you get a colonoscopy every 10 years.  Flexible sigmoidoscopy or colonoscopy.** / Every 5 years for a flexible sigmoidoscopy or every 10 years for a colonoscopy beginning at age 7 years and continuing until age 32 years.  Hepatitis C blood test.** / For all people born from [birth year] through 1965 and any individual with known risks for hepatitis C.  Osteoporosis screening.** / A one-time screening for women ages 30 years and over and women at risk for fractures or osteoporosis.  Skin self-exam. / Monthly.  Influenza vaccine. / Every year.  Tetanus, diphtheria, and acellular pertussis (Tdap/Td)  vaccine.** / 1 dose of Td every 10 years.  Varicella vaccine.** / Consult your health care provider.  Zoster vaccine.** / 1 dose for adults aged 35 years or older.  Pneumococcal 13-valent conjugate (PCV13) vaccine.** / Consult your health care provider.  Pneumococcal polysaccharide (PPSV23) vaccine.** / 1 dose for all adults aged 46 years and older.  Meningococcal vaccine.** / Consult your health care provider.  Hepatitis A vaccine.** / Consult your health care provider.  Hepatitis B vaccine.** / Consult your health care provider.  Haemophilus influenzae type b (Hib) vaccine.** / Consult your health care provider. ** Family history and personal history of risk and conditions may change your health care provider's recommendations.   This information is not intended to replace advice given to you by your health care provider. Make sure you discuss any questions you have with your health care provider.   Document Released: 01/14/2002 Document Revised: 12/09/2014 Document Reviewed: 04/15/2011 Elsevier Interactive Patient Education [employer].

## 2016-10-07 NOTE — Assessment & Plan Note (Signed)
Patient has undergone several colonics to reset the Biofilme sees Angela Buck and has been put on Colon Max, 2 nightly and bowels are moving comfortably twice daily

## 2016-10-07 NOTE — Progress Notes (Signed)
Pre visit review using our clinic review tool, if applicable. No additional management support is needed unless otherwise documented below in the visit note. 

## 2016-10-07 NOTE — Assessment & Plan Note (Signed)
Encouraged heart healthy diet, increase exercise, avoid trans fats, consider a krill oil cap daily 

## 2016-10-08 ENCOUNTER — Other Ambulatory Visit: Payer: Self-pay | Admitting: Family Medicine

## 2016-10-08 DIAGNOSIS — E079 Disorder of thyroid, unspecified: Secondary | ICD-10-CM

## 2016-10-13 NOTE — Progress Notes (Signed)
Patient ID: Angela Buck, female   DOB: 09-15-1941, 75 y.o.   MRN: [id number]   Subjective:    Patient ID: Angela Buck, female    DOB: [birth year], 75 y.o.   MRN: [id number]  Chief Complaint  Patient presents with  . Annual Exam    HPI Patient is in today for Annual Preventative Exam. She feels well. She denies an recent illness or acute concerns. No recent hospitalizations. She continues to stay active and eat a heart healthy diet. Denies CP/palp/SOB/HA/congestion/fevers/GI or GU c/o. Taking meds as prescribed  Past Medical History:  Diagnosis Date  . Abdominal pain 03/16/2012  . 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 Chicken pox as a child  . Colonic polyp 01/05/2012  . Elevated BP 01/05/2012  . Fatigue 03/16/2012  . History of mumps   . Leukopenia 05/18/2014  . Mass of axilla 01/05/2012  . Measles as a child  . Medicare annual wellness visit, subsequent 01/05/2012   Sees Dr Collene Mares of Gastroenterology   . Neutropenia ([place]) 10/07/2016  . Osteopenia 01/05/2012  . Other and unspecified hyperlipidemia 05/18/2014  . Preventative health care 01/05/2012  . Preventative health care 10/07/2016  . Skin cancer 12/02/2009  . Thyroid disease 03/16/2012   right     Past Surgical History:  Procedure Laterality Date  . biopsy of jawline  2011  . CATARACT EXTRACTION, BILATERAL     b/l  . laproscopy  1970   abdominal, endometriosis, with D&C  . right hip replacement  2003  . TONSILLECTOMY AND ADENOIDECTOMY  1949    Family History  Problem Relation Age of Onset  . Arthritis Mother   . Hypertension Mother   . Dementia Mother   . Heart attack Mother   . Glaucoma Mother   . Heart disease Mother     MI, stent in 2004  . Emphysema Father     smoker  . Hypertension Father   . Glaucoma Father   . COPD Father   . Other Paternal Grandfather     enlarged heart  . Ulcers Maternal Grandmother     Social History   Social History  . Marital status: Divorced    Spouse name: N/A    . Number of children: N/A  . Years of education: N/A   Occupational History  . Not on file.   Social History Main Topics  . Smoking status: Never Smoker  . Smokeless tobacco: Never Used  . Alcohol use Yes     Comment: occasional wine  . Drug use: No  . Sexual activity: Yes    Partners: Female     Comment: lives with partner, no major dietary restrictions.    Other Topics Concern  . Not on file   Social History Narrative  . No narrative on file    Outpatient Medications Prior to Visit  Medication Sig Dispense Refill  . ARMOUR THYROID 15 MG tablet TAKE 1 TABLET BY MOUTH EVERY DAY (Patient taking differently: take 1 tablet by mouth every day) 30 tablet 6  . ARMOUR THYROID 15 MG tablet TAKE 1 TABLET DAILY FOR 5 DAYS A WEEK, AND 2 TABLETS DAILY ON TUESDAY AND SATURDAYS 120 tablet 1   No facility-administered medications prior to visit.     Allergies  Allergen Reactions  . Bee Venom Anaphylaxis  . Penicillins Hives    Has patient had a PCN reaction causing immediate rash, facial/tongue/throat swelling, SOB or lightheadedness with hypotension: No Has patient had  a PCN reaction causing severe rash involving mucus membranes or skin necrosis: No Has patient had a PCN reaction that required hospitalization No Has patient had a PCN reaction occurring within the last 10 years: No If all of the above answers are "NO", then may proceed with Cephalosporin use.    Review of Systems  Constitutional: Negative for chills, fever and malaise/fatigue.  HENT: Negative for congestion and hearing loss.   Eyes: Negative for discharge.  Respiratory: Negative for cough, sputum production and shortness of breath.   Cardiovascular: Negative for chest pain, palpitations and leg swelling.  Gastrointestinal: Negative for abdominal pain, blood in stool, constipation, diarrhea, heartburn, nausea and vomiting.  Genitourinary: Negative for dysuria, frequency, hematuria and urgency.  Musculoskeletal:  Negative for back pain, falls and myalgias.  Skin: Negative for rash.  Neurological: Negative for dizziness, sensory change, loss of consciousness, weakness and headaches.  Endo/Heme/Allergies: Negative for environmental allergies. Does not bruise/bleed easily.  Psychiatric/Behavioral: Negative for depression and suicidal ideas. The patient is not nervous/anxious and does not have insomnia.        Objective:    Physical Exam  Constitutional: She is oriented to person, place, and time. She appears well-developed and well-nourished. No distress.  HENT:  Head: Normocephalic and atraumatic.  Eyes: Conjunctivae are normal.  Neck: Neck supple. No thyromegaly present.  Cardiovascular: Normal rate, regular rhythm and normal heart sounds.   No murmur heard. Pulmonary/Chest: Effort normal and breath sounds normal. No respiratory distress.  Abdominal: Soft. Bowel sounds are normal. She exhibits no distension and no mass. There is no tenderness.  Musculoskeletal: She exhibits no edema.  Lymphadenopathy:    She has no cervical adenopathy.  Neurological: She is alert and oriented to person, place, and time.  Skin: Skin is warm and dry.  Psychiatric: She has a normal mood and affect. Her behavior is normal.    BP 108/68 (BP Location: Left Arm, Patient Position: Sitting, Cuff Size: Normal)   Pulse 63   Temp 98.5 F (36.9 C) (Oral)   Ht 5\' 4"  (1.626 m)   Wt 134 lb 4 oz (60.9 kg)   SpO2 98%   BMI 23.04 kg/m  Wt Readings from Last 3 Encounters:  10/07/16 134 lb 4 oz (60.9 kg)  04/05/16 130 lb 2 oz (59 kg)  09/29/15 132 lb (59.9 kg)     Lab Results  Component Value Date   WBC 4.7 10/07/2016   HGB 12.6 10/07/2016   HCT 37.7 10/07/2016   PLT 256.0 10/07/2016   GLUCOSE 87 10/07/2016   CHOL 177 10/07/2016   TRIG 96.0 10/07/2016   HDL 80.40 10/07/2016   LDLCALC 77 10/07/2016   ALT 19 10/07/2016   AST 27 10/07/2016   NA 141 10/07/2016   K 4.4 10/07/2016   CL 104 10/07/2016    CREATININE 1.00 10/07/2016   BUN 18 10/07/2016   CO2 33 (H) 10/07/2016   TSH 11.03 (H) 10/07/2016    Lab Results  Component Value Date   TSH 11.03 (H) 10/07/2016   Lab Results  Component Value Date   WBC 4.7 10/07/2016   HGB 12.6 10/07/2016   HCT 37.7 10/07/2016   MCV 91.7 10/07/2016   PLT 256.0 10/07/2016   Lab Results  Component Value Date   NA 141 10/07/2016   K 4.4 10/07/2016   CO2 33 (H) 10/07/2016   GLUCOSE 87 10/07/2016   BUN 18 10/07/2016   CREATININE 1.00 10/07/2016   BILITOT 0.5 10/07/2016   ALKPHOS 62  10/07/2016   AST 27 10/07/2016   ALT 19 10/07/2016   PROT 6.8 10/07/2016   ALBUMIN 4.1 10/07/2016   CALCIUM 9.9 10/07/2016   GFR 57.72 (L) 10/07/2016   Lab Results  Component Value Date   CHOL 177 10/07/2016   Lab Results  Component Value Date   HDL 80.40 10/07/2016   Lab Results  Component Value Date   LDLCALC 77 10/07/2016   Lab Results  Component Value Date   TRIG 96.0 10/07/2016   Lab Results  Component Value Date   CHOLHDL 2 10/07/2016   No results found for: HGBA1C     Assessment & Plan:   Problem List Items Addressed This Visit    Osteopenia    Encouraged to get adequate exercise, calcium and vitamin d intake      Thyroid disease    Tolerating Armour Thyroid      Relevant Medications   thyroid (ARMOUR THYROID) 15 MG tablet   Other Relevant Orders   TSH (Completed)   T4, free (Completed)   Hyperlipidemia, mild    Encouraged heart healthy diet, increase exercise, avoid trans fats, consider a krill oil cap daily      Relevant Orders   Lipid panel (Completed)   Constipation by delayed colonic transit    Patient has undergone several colonics to reset the Biofilme sees [employer] and has been put on Colon Max, 2 nightly and bowels are moving comfortably twice daily      Relevant Orders   TSH (Completed)   T4, free (Completed)   Preventative health care    Patient encouraged to maintain heart healthy diet, regular  exercise, adequate sleep. Consider daily probiotics. Take medications as prescribed. Given and reviewed copy of ACP documents from [facility] and encouraged to complete and return      Relevant Orders   Comprehensive metabolic panel (Completed)   Neutropenia ([place])    Check CBC      Relevant Orders   CBC (Completed)      I have discontinued Ms. Valdez's ARMOUR THYROID and ARMOUR THYROID. I am also having her start on thyroid.  Meds ordered this encounter  Medications  . thyroid (ARMOUR THYROID) 15 MG tablet    Sig: Take 1 tablet (15 mg total) by mouth daily.    Dispense:  90 tablet    Refill:  1     Penni Homans, MD

## 2016-11-06 ENCOUNTER — Other Ambulatory Visit: Payer: Self-pay | Admitting: Family Medicine

## 2016-11-06 ENCOUNTER — Telehealth: Payer: Self-pay | Admitting: Family Medicine

## 2016-11-06 DIAGNOSIS — H9190 Unspecified hearing loss, unspecified ear: Secondary | ICD-10-CM

## 2016-11-06 NOTE — Telephone Encounter (Signed)
Caller name:Yolunda Melchert Relationship to patient: Can be reached:[phone] Pharmacy:  Reason for call:Need referral sent to [facility] to hearing test. Must be back date to 11/13, was told then to have referral done. Don't see a referral from then, patient was seen today with [facility] Speech and hearing, Fax referral to [phone]

## 2016-11-07 NOTE — Telephone Encounter (Signed)
I sent the referral yesterday when I got the request

## 2016-11-08 NOTE — Telephone Encounter (Signed)
done

## 2016-12-06 ENCOUNTER — Telehealth: Payer: Self-pay | Admitting: Family Medicine

## 2016-12-06 NOTE — Telephone Encounter (Signed)
PCP received from [place] her results. Informed her PCP noted mild to severe hearing loss. Patient was aware already.

## 2016-12-10 ENCOUNTER — Telehealth: Payer: Self-pay | Admitting: Family Medicine

## 2016-12-10 NOTE — Telephone Encounter (Signed)
Called patient to schedule awv. Left msg for patient to call office to schedule appt.  °

## 2017-01-13 ENCOUNTER — Other Ambulatory Visit (INDEPENDENT_AMBULATORY_CARE_PROVIDER_SITE_OTHER): Payer: Medicare Other

## 2017-01-13 DIAGNOSIS — M8580 Other specified disorders of bone density and structure, unspecified site: Secondary | ICD-10-CM

## 2017-01-13 DIAGNOSIS — E785 Hyperlipidemia, unspecified: Secondary | ICD-10-CM | POA: Diagnosis not present

## 2017-01-13 DIAGNOSIS — E079 Disorder of thyroid, unspecified: Secondary | ICD-10-CM | POA: Diagnosis not present

## 2017-01-13 DIAGNOSIS — Z0000 Encounter for general adult medical examination without abnormal findings: Secondary | ICD-10-CM

## 2017-01-13 LAB — T3, FREE: T3, Free: 3 pg/mL (ref 2.3–4.2)

## 2017-01-13 LAB — TSH: TSH: 8.19 u[IU]/mL — ABNORMAL HIGH (ref 0.35–4.50)

## 2017-01-13 LAB — VITAMIN D 25 HYDROXY (VIT D DEFICIENCY, FRACTURES): VITD: 25.77 ng/mL — ABNORMAL LOW (ref 30.00–100.00)

## 2017-01-14 ENCOUNTER — Other Ambulatory Visit: Payer: Self-pay | Admitting: Family Medicine

## 2017-01-14 MED ORDER — VITAMIN D (ERGOCALCIFEROL) 1.25 MG (50000 UNIT) PO CAPS: 50000.0000 [IU] | ORAL_CAPSULE | ORAL | 4 refills | Status: DC

## 2017-01-23 ENCOUNTER — Telehealth: Payer: Self-pay | Admitting: *Deleted

## 2017-01-23 NOTE — Telephone Encounter (Signed)
AWV scheduled 04/07/17 @8 .

## 2017-03-07 ENCOUNTER — Other Ambulatory Visit: Payer: Self-pay | Admitting: Family Medicine

## 2017-04-03 NOTE — Progress Notes (Signed)
Pre visit review using our clinic review tool, if applicable. No additional management support is needed unless otherwise documented below in the visit note. 

## 2017-04-03 NOTE — Progress Notes (Signed)
Subjective:   Angela Buck is a 76 y.o. female who presents for Medicare Annual (Subsequent) preventive examination.  Review of Systems:  No ROS.  Medicare Wellness Visit. Cardiac Risk Factors include: advanced age (>42men, >26 women);dyslipidemia Sleep patterns: Sleeps well but difficulty falling back asleep after waking once in the night to urinate. Home Safety/Smoke Alarms:  Feels safe in home. Smoke alarms in place.  Living environment; residence and Firearm Safety: Lives with partner. One story.No guns Seat Belt Safety/Bike Helmet: Wears seat belt.   Counseling:   Eye Exam- Hx of cataract sx. Wears reading glasses.Dr. Delman Cheadle biannually. Dental-Dr.Sanders 3x/yr.  Female:   Pap-  Will discuss with Dr.Blyth at appt today.     Mammo- Last 02/22/15: BI-RADS CATEGORY  1: Negative.    ORDERED TODAY   Dexa scan- Last 10/13/15:  osteoporosis      CCS- Last 12/05/10: One polyp removed. Repeat 5-43yrs per report. No letter on file with pathology of polyp.    Objective:     Vitals: BP (!) 100/58 (BP Location: Right Arm, Patient Position: Sitting, Cuff Size: Normal)   Pulse 70   Ht 5\' 4"  (1.626 m)   Wt 137 lb (62.1 kg)   SpO2 96%   BMI 23.52 kg/m   Body mass index is 23.52 kg/m.   Tobacco History  Smoking Status  . Never Smoker  Smokeless Tobacco  . Never Used     Counseling given: No   Past Medical History:  Diagnosis Date  . Abdominal pain 03/16/2012  . 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 Chicken pox as a child  . Colonic polyp 01/05/2012  . Elevated BP 01/05/2012  . Fatigue 03/16/2012  . History of mumps   . Leukopenia 05/18/2014  . Mass of axilla 01/05/2012  . Measles as a child  . Medicare annual wellness visit, subsequent 01/05/2012   Sees Dr Collene Mares of Gastroenterology   . Neutropenia ([place]) 10/07/2016  . Osteopenia 01/05/2012  . Other and unspecified hyperlipidemia 05/18/2014  . Preventative health care 01/05/2012  . Preventative health care 10/07/2016    . Skin cancer 12/02/2009  . Thyroid disease 03/16/2012   right    Past Surgical History:  Procedure Laterality Date  . biopsy of jawline  2011  . CATARACT EXTRACTION, BILATERAL     b/l  . laproscopy  1970   abdominal, endometriosis, with D&C  . right hip replacement  2003  . TONSILLECTOMY AND ADENOIDECTOMY  1949   Family History  Problem Relation Age of Onset  . Arthritis Mother   . Hypertension Mother   . Dementia Mother   . Heart attack Mother   . Glaucoma Mother   . Heart disease Mother     MI, stent in 2004  . Emphysema Father     smoker  . Hypertension Father   . Glaucoma Father   . COPD Father   . Other Paternal Grandfather     enlarged heart  . Ulcers Maternal Grandmother    History  Sexual Activity  . Sexual activity: No    Comment: lives with partner, no major dietary restrictions.     Outpatient Encounter Prescriptions as of 04/07/2017  Medication Sig  . thyroid (ARMOUR THYROID) 15 MG tablet Take 1 tablet (15 mg total) by mouth daily. (Patient taking differently: Take 1 tablet every day , but on Saturday and Wednesday take 2 by mouth)  . Vitamin D, Ergocalciferol, (DRISDOL) 50000 units CAPS capsule Take 1  capsule (50,000 Units total) by mouth every 7 (seven) days.  . [DISCONTINUED] ARMOUR THYROID 15 MG tablet TAKE 1 TABLET DAILY FOR 5 DAYS A WEEK, AND 2 TABLETS DAILY ON TUESDAY AND SATURDAYS   No facility-administered encounter medications on file as of 04/07/2017.     Activities of Daily Living In your present state of health, do you have any difficulty performing the following activities: 04/07/2017 04/07/2016  Hearing? N N  Vision? N N  Difficulty concentrating or making decisions? N N  Walking or climbing stairs? N N  Dressing or bathing? N N  Doing errands, shopping? N -  Preparing Food and eating ? N -  Using the Toilet? N -  In the past six months, have you accidently leaked urine? N -  Do you have problems with loss of bowel control? N -  Managing  your Medications? N -  Managing your Finances? N -  Housekeeping or managing your Housekeeping? N -  Some recent data might be hidden    Patient Care Team: Mosie Lukes, MD as PCP - General (Family Medicine) Mosie Lukes, MD (Family Medicine) Jari Pigg, MD as Consulting Physician (Dermatology)    Assessment:    Physical assessment deferred to PCP.  Exercise Activities and Dietary recommendations Current Exercise Habits: Structured exercise class, Type of exercise: (S) calisthenics (cycling), Time (Minutes): > 60, Frequency (Times/Week): 5, Weekly Exercise (Minutes/Week): 0, Intensity: Moderate   Diet (meal preparation, eat out, water intake, caffeinated beverages, dairy products, fruits and vegetables): in general, a "healthy" diet      Goals    . Continue being active.    . Travel back to [place]  04/07/2017 10/07/2016 09/29/2015 09/15/2014  Falls in the past year? No No No No   Depression Screen PHQ 2/9 Scores 04/07/2017 10/07/2016 09/29/2015 09/15/2014  PHQ - 2 Score 0 0 0 0     Cognitive Function Ad8 score reviewed for issues:  Issues making decisions:no  Less interest in hobbies / activities:no  Repeats questions, stories (family complaining):no  Trouble using ordinary gadgets (microwave, computer, phone):no  Forgets the month or year: no  Mismanaging finances: no  Remembering appts:no  Daily problems with thinking and/or memory:no Ad8 score is=0         There is no immunization history on file for this patient. Screening Tests Health Maintenance  Topic Date Due  . TETANUS/TDAP  06/17/1962  . PNA vac Low Risk Adult (1 of 2 - PCV13) 06/17/2008  . MAMMOGRAM  02/21/2017  . INFLUENZA VACCINE  11/24/2017 (Originally 07/02/2017)  . COLONOSCOPY  12/05/2020  . DEXA SCAN  Completed      Plan:     Follow up with PCP today as scheduled.  Continue to eat heart healthy diet (full of fruits, vegetables, whole grains, lean  protein, water--limit salt, fat, and sugar intake) and increase physical activity as tolerated.  Continue doing brain stimulating activities (puzzles, reading, adult coloring books, staying active) to keep memory sharp.    I have personally reviewed and noted the following in the patient's chart:   . Medical and social history . Use of alcohol, tobacco or illicit drugs  . Current medications and supplements . Functional ability and status . Nutritional status . Physical activity . Advanced directives . List of other physicians . Hospitalizations, surgeries, and ER visits in previous 12 months . Vitals . Screenings to include cognitive, depression, and falls . Referrals and appointments  In addition, I have reviewed and discussed with patient certain preventive protocols, quality metrics, and best practice recommendations. A written personalized care plan for preventive services as well as general preventive health recommendations were provided to patient.     Shela Nevin, [place]  04/07/2017    Medical screening examination was performed by Health Coach and as supervising physician I was immediately available for consultation/collaboration. I have reviewed documentation and agree with assessment and plan.  Penni Homans, MD

## 2017-04-07 ENCOUNTER — Ambulatory Visit (INDEPENDENT_AMBULATORY_CARE_PROVIDER_SITE_OTHER): Payer: Medicare Other | Admitting: Family Medicine

## 2017-04-07 ENCOUNTER — Encounter: Payer: Self-pay | Admitting: Family Medicine

## 2017-04-07 VITALS — BP 100/58 | HR 70 | Ht 64.0 in | Wt 137.0 lb

## 2017-04-07 DIAGNOSIS — E079 Disorder of thyroid, unspecified: Secondary | ICD-10-CM

## 2017-04-07 DIAGNOSIS — Z1239 Encounter for other screening for malignant neoplasm of breast: Secondary | ICD-10-CM

## 2017-04-07 DIAGNOSIS — M79672 Pain in left foot: Secondary | ICD-10-CM

## 2017-04-07 DIAGNOSIS — E785 Hyperlipidemia, unspecified: Secondary | ICD-10-CM

## 2017-04-07 DIAGNOSIS — R030 Elevated blood-pressure reading, without diagnosis of hypertension: Secondary | ICD-10-CM | POA: Diagnosis not present

## 2017-04-07 DIAGNOSIS — K5901 Slow transit constipation: Secondary | ICD-10-CM

## 2017-04-07 DIAGNOSIS — D709 Neutropenia, unspecified: Secondary | ICD-10-CM | POA: Diagnosis not present

## 2017-04-07 DIAGNOSIS — E559 Vitamin D deficiency, unspecified: Secondary | ICD-10-CM

## 2017-04-07 DIAGNOSIS — Z1231 Encounter for screening mammogram for malignant neoplasm of breast: Secondary | ICD-10-CM

## 2017-04-07 DIAGNOSIS — Z0000 Encounter for general adult medical examination without abnormal findings: Secondary | ICD-10-CM | POA: Diagnosis not present

## 2017-04-07 HISTORY — DX: Pain in left foot: M79.672

## 2017-04-07 HISTORY — DX: Vitamin D deficiency, unspecified: E55.9

## 2017-04-07 LAB — T4, FREE: Free T4: 0.77 ng/dL (ref 0.60–1.60)

## 2017-04-07 LAB — COMPREHENSIVE METABOLIC PANEL
ALT: 13 U/L (ref 0–35)
AST: 24 U/L (ref 0–37)
Albumin: 4.2 g/dL (ref 3.5–5.2)
Alkaline Phosphatase: 55 U/L (ref 39–117)
BUN: 27 mg/dL — ABNORMAL HIGH (ref 6–23)
CO2: 29 mEq/L (ref 19–32)
Calcium: 9.7 mg/dL (ref 8.4–10.5)
Chloride: 102 mEq/L (ref 96–112)
Creatinine, Ser: 1.03 mg/dL (ref 0.40–1.20)
GFR: 55.7 mL/min — ABNORMAL LOW (ref >60.00)
Glucose, Bld: 89 mg/dL (ref 70–99)
Potassium: 4.3 mEq/L (ref 3.5–5.1)
Sodium: 137 mEq/L (ref 135–145)
Total Bilirubin: 0.5 mg/dL (ref 0.2–1.2)
Total Protein: 6.8 g/dL (ref 6.0–8.3)

## 2017-04-07 LAB — CBC
HCT: 38.8 % (ref 36.0–46.0)
Hemoglobin: 12.8 g/dL (ref 12.0–15.0)
MCHC: 33.1 g/dL (ref 30.0–36.0)
MCV: 93.8 fl (ref 78.0–100.0)
Platelets: 242 10*3/uL (ref 150.0–400.0)
RBC: 4.13 Mil/uL (ref 3.87–5.11)
RDW: 13 % (ref 11.5–15.5)
WBC: 5.2 10*3/uL (ref 4.0–10.5)

## 2017-04-07 LAB — LIPID PANEL
Cholesterol: 182 mg/dL (ref 0–200)
HDL: 82.1 mg/dL (ref >39.00)
LDL Cholesterol: 78 mg/dL (ref 0–99)
NonHDL: 100.29
Total CHOL/HDL Ratio: 2
Triglycerides: 111 mg/dL (ref 0.0–149.0)
VLDL: 22.2 mg/dL (ref 0.0–40.0)

## 2017-04-07 LAB — VITAMIN D 25 HYDROXY (VIT D DEFICIENCY, FRACTURES): VITD: 75.04 ng/mL (ref 30.00–100.00)

## 2017-04-07 LAB — T3: T3, Total: 79 ng/dL (ref 76–181)

## 2017-04-07 LAB — TSH: TSH: 5.04 u[IU]/mL — ABNORMAL HIGH (ref 0.35–4.50)

## 2017-04-07 NOTE — Assessment & Plan Note (Signed)
Check level and continue supplements 

## 2017-04-07 NOTE — Assessment & Plan Note (Addendum)
Asymptomatic, resolved 

## 2017-04-07 NOTE — Progress Notes (Signed)
Pre visit review using our clinic review tool, if applicable. No additional management support is needed unless otherwise documented below in the visit note. 

## 2017-04-07 NOTE — Assessment & Plan Note (Signed)
Encouraged to get adequate exercise, calcium and vitamin d intake 

## 2017-04-07 NOTE — Assessment & Plan Note (Signed)
Check labs today.

## 2017-04-07 NOTE — Patient Instructions (Signed)
  Angela Buck , Thank you for taking time to come for your Medicare Wellness Visit. I appreciate your ongoing commitment to your health goals. Please review the following plan we discussed and let me know if I can assist you in the future.   These are the goals we discussed: Goals    . Continue being active.    . Travel back to [place]       This is a list of the screening recommended for you and due dates:  Health Maintenance  Topic Date Due  . Tetanus Vaccine  06/17/1962  . Pneumonia vaccines (1 of 2 - PCV13) 06/17/2008  . Mammogram  02/21/2017  . Flu Shot  11/24/2017*  . Colon Cancer Screening  12/05/2020  . DEXA scan (bone density measurement)  Completed  *Topic was postponed. The date shown is not the original due date.   KEEP UP THE GREAT WORK!!!

## 2017-04-07 NOTE — Progress Notes (Signed)
Patient ID: Angela Buck, female   DOB: 01-17-1941, 76 y.o.   MRN: [id number]   Subjective:  I acted as a Education administrator for Penni Homans, [place], [place]   Patient ID: Angela Buck, female    DOB: 1941/08/14, 76 y.o.   MRN: [id number]  Chief Complaint  Patient presents with  . Medicare Wellness    with RN  . Foot Pain    left heel  . Gynecologic Exam    Pt curious if she still needs to be screened    HPI  Patient is in today for an annual examination. Patient has a Hx of thyroid disease, osteopenia, fatigue, elevated B/P, mild hyperlipidemia, leukopenia. Patient has no acute concerns noted at this time. She has made a recent trip to [place] to visit her naturopath and they adjusted a thyroid supplement and she reports she is feeling better. Denies CP/palp/SOB/HA/congestion/fevers/GI or GU c/o. Taking meds as prescribed  Patient Care Team: Mosie Lukes, MD as PCP - General (Family Medicine) Mosie Lukes, MD (Family Medicine) Jari Pigg, MD as Consulting Physician (Dermatology)   Past Medical History:  Diagnosis Date  . Abdominal pain 03/16/2012  . 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 Chicken pox as a child  . Colonic polyp 01/05/2012  . Elevated BP 01/05/2012  . Fatigue 03/16/2012  . History of mumps   . Leukopenia 05/18/2014  . Mass of axilla 01/05/2012  . Measles as a child  . Medicare annual wellness visit, subsequent 01/05/2012   Sees Dr Collene Mares of Gastroenterology   . Neutropenia ([place]) 10/07/2016  . Osteopenia 01/05/2012  . Other and unspecified hyperlipidemia 05/18/2014  . Pain of left heel 04/07/2017  . Preventative health care 01/05/2012  . Preventative health care 10/07/2016  . Skin cancer 12/02/2009  . Thyroid disease 03/16/2012   right   . Vitamin D deficiency 04/07/2017    Past Surgical History:  Procedure Laterality Date  . biopsy of jawline  2011  . CATARACT EXTRACTION, BILATERAL     b/l  . laproscopy  1970   abdominal, endometriosis, with D&C  .  right hip replacement  2003  . TONSILLECTOMY AND ADENOIDECTOMY  1949    Family History  Problem Relation Age of Onset  . Arthritis Mother   . Hypertension Mother   . Dementia Mother   . Heart attack Mother   . Glaucoma Mother   . Heart disease Mother     MI, stent in 2004  . Emphysema Father     smoker  . Hypertension Father   . Glaucoma Father   . COPD Father   . Other Paternal Grandfather     enlarged heart  . Ulcers Maternal Grandmother     Social History   Social History  . Marital status: Divorced    Spouse name: N/A  . Number of children: N/A  . Years of education: N/A   Occupational History  . Not on file.   Social History Main Topics  . Smoking status: Never Smoker  . Smokeless tobacco: Never Used  . Alcohol use Yes     Comment: occasional wine  . Drug use: No  . Sexual activity: No     Comment: lives with partner, no major dietary restrictions.    Other Topics Concern  . Not on file   Social History Narrative  . No narrative on file    Outpatient Medications Prior to Visit  Medication Sig Dispense Refill  .  thyroid (ARMOUR THYROID) 15 MG tablet Take 1 tablet (15 mg total) by mouth daily. (Patient taking differently: Take 1 tablet every day , but on Saturday and Wednesday take 2 by mouth) 90 tablet 1  . Vitamin D, Ergocalciferol, (DRISDOL) 50000 units CAPS capsule Take 1 capsule (50,000 Units total) by mouth every 7 (seven) days. 4 capsule 4  . ARMOUR THYROID 15 MG tablet TAKE 1 TABLET DAILY FOR 5 DAYS A WEEK, AND 2 TABLETS DAILY ON TUESDAY AND SATURDAYS 120 tablet 1   No facility-administered medications prior to visit.     Allergies  Allergen Reactions  . Bee Venom Anaphylaxis  . Penicillins Hives    Has patient had a PCN reaction causing immediate rash, facial/tongue/throat swelling, SOB or lightheadedness with hypotension: No Has patient had a PCN reaction causing severe rash involving mucus membranes or skin necrosis: No Has patient had a  PCN reaction that required hospitalization No Has patient had a PCN reaction occurring within the last 10 years: No If all of the above answers are "NO", then may proceed with Cephalosporin use.    Review of Systems  Constitutional: Negative for fever and malaise/fatigue.  HENT: Negative for congestion.   Eyes: Negative for blurred vision.  Respiratory: Negative for cough and shortness of breath.   Cardiovascular: Negative for chest pain, palpitations and leg swelling.  Gastrointestinal: Negative for vomiting.  Musculoskeletal: Negative for back pain.  Skin: Negative for rash.  Neurological: Negative for loss of consciousness and headaches.       Objective:    Physical Exam  Constitutional: She is oriented to person, place, and time. She appears well-developed and well-nourished. No distress.  HENT:  Head: Normocephalic and atraumatic.  Eyes: Conjunctivae are normal.  Neck: Normal range of motion. No thyromegaly present.  Cardiovascular: Normal rate and regular rhythm.   Pulmonary/Chest: Effort normal and breath sounds normal. She has no wheezes.  Abdominal: Soft. Bowel sounds are normal. There is no tenderness.  Musculoskeletal: She exhibits no edema or deformity.  Neurological: She is alert and oriented to person, place, and time.  Skin: Skin is warm and dry. She is not diaphoretic.  Psychiatric: She has a normal mood and affect.    BP (!) 100/58 (BP Location: Right Arm, Patient Position: Sitting, Cuff Size: Normal)   Pulse 70   Ht 5\' 4"  (1.626 m)   Wt 137 lb (62.1 kg)   SpO2 96%   BMI 23.52 kg/m  Wt Readings from Last 3 Encounters:  04/07/17 137 lb (62.1 kg)  10/07/16 134 lb 4 oz (60.9 kg)  04/05/16 130 lb 2 oz (59 kg)   BP Readings from Last 3 Encounters:  04/07/17 (!) 100/58  10/07/16 108/68  04/07/16 107/63      There is no immunization history on file for this patient.  Health Maintenance  Topic Date Due  . TETANUS/TDAP  06/17/1962  . PNA vac Low Risk  Adult (1 of 2 - PCV13) 06/17/2008  . MAMMOGRAM  02/21/2017  . INFLUENZA VACCINE  11/24/2017 (Originally 07/02/2017)  . COLONOSCOPY  12/05/2020  . DEXA SCAN  Completed    Lab Results  Component Value Date   WBC 5.2 04/07/2017   HGB 12.8 04/07/2017   HCT 38.8 04/07/2017   PLT 242.0 04/07/2017   GLUCOSE 89 04/07/2017   CHOL 182 04/07/2017   TRIG 111.0 04/07/2017   HDL 82.10 04/07/2017   LDLCALC 78 04/07/2017   ALT 13 04/07/2017   AST 24 04/07/2017   NA  137 04/07/2017   K 4.3 04/07/2017   CL 102 04/07/2017   CREATININE 1.03 04/07/2017   BUN 27 (H) 04/07/2017   CO2 29 04/07/2017   TSH 5.04 (H) 04/07/2017    Lab Results  Component Value Date   TSH 5.04 (H) 04/07/2017   Lab Results  Component Value Date   WBC 5.2 04/07/2017   HGB 12.8 04/07/2017   HCT 38.8 04/07/2017   MCV 93.8 04/07/2017   PLT 242.0 04/07/2017   Lab Results  Component Value Date   NA 137 04/07/2017   K 4.3 04/07/2017   CO2 29 04/07/2017   GLUCOSE 89 04/07/2017   BUN 27 (H) 04/07/2017   CREATININE 1.03 04/07/2017   BILITOT 0.5 04/07/2017   ALKPHOS 55 04/07/2017   AST 24 04/07/2017   ALT 13 04/07/2017   PROT 6.8 04/07/2017   ALBUMIN 4.2 04/07/2017   CALCIUM 9.7 04/07/2017   GFR 55.70 (L) 04/07/2017   Lab Results  Component Value Date   CHOL 182 04/07/2017   Lab Results  Component Value Date   HDL 82.10 04/07/2017   Lab Results  Component Value Date   LDLCALC 78 04/07/2017   Lab Results  Component Value Date   TRIG 111.0 04/07/2017   Lab Results  Component Value Date   CHOLHDL 2 04/07/2017   No results found for: HGBA1C       Assessment & Plan:   Problem List Items Addressed This Visit    Borderline high blood pressure   Relevant Orders   CBC (Completed)   Comprehensive metabolic panel (Completed)   Thyroid disease    Check labs today      Relevant Orders   TSH (Completed)   T4, free (Completed)   T3 (Completed)   Hyperlipidemia, mild    Encouraged heart healthy  diet, increase exercise, avoid trans fats, consider a krill oil cap daily      Relevant Orders   Lipid panel (Completed)   Constipation by delayed colonic transit   Neutropenia (H[facility])    Asymptomatic, resolved      Vitamin D deficiency    Check level and continue supplements      Relevant Orders   VITAMIN D 25 Hydroxy (Vit-D Deficiency, Fractures) (Completed)   Pain of left heel    Tolerable, will continue to use good shoes. Try ice, stretching, topical rubs       Other Visit Diagnoses    Breast cancer screening    -  Primary   Relevant Orders   MM DIGITAL SCREENING BILATERAL   Encounter for Medicare annual wellness exam          I have discontinued Ms. Elko's ARMOUR THYROID. I am also having her maintain her thyroid and Vitamin D (Ergocalciferol).  No orders of the defined types were placed in this encounter.   CMA served as Education administrator during this visit. History, Physical and Plan performed by medical provider. Documentation and orders reviewed and attested to.  Penni Homans, MD

## 2017-04-07 NOTE — Assessment & Plan Note (Signed)
Encouraged heart healthy diet, increase exercise, avoid trans fats, consider a krill oil cap daily 

## 2017-04-07 NOTE — Assessment & Plan Note (Signed)
Tolerable, will continue to use good shoes. Try ice, stretching, topical rubs

## 2017-05-20 ENCOUNTER — Other Ambulatory Visit: Payer: Self-pay | Admitting: Family Medicine

## 2017-05-20 ENCOUNTER — Encounter: Payer: Self-pay | Admitting: Family Medicine

## 2017-05-20 DIAGNOSIS — G8929 Other chronic pain: Secondary | ICD-10-CM

## 2017-05-20 DIAGNOSIS — M79672 Pain in left foot: Principal | ICD-10-CM

## 2017-06-10 ENCOUNTER — Ambulatory Visit (INDEPENDENT_AMBULATORY_CARE_PROVIDER_SITE_OTHER): Payer: Medicare Other | Admitting: Podiatry

## 2017-06-10 DIAGNOSIS — M722 Plantar fascial fibromatosis: Secondary | ICD-10-CM

## 2017-06-10 NOTE — Patient Instructions (Signed)

## 2017-06-11 DIAGNOSIS — M722 Plantar fascial fibromatosis: Secondary | ICD-10-CM | POA: Insufficient documentation

## 2017-06-11 NOTE — Progress Notes (Signed)
Subjective:    Patient ID: Angela Buck, female   DOB: 76 y.o.   MRN: [id number]   HPI 76 year old female presents the office with concerns of left heel pain which is ongoing for about 6 months. She states that she gets pain in the morning when she first gets or after periods of rest. She states that she hasn't some discomfort after she has been on her feet during the day. She denies any recent injury or trauma. No swelling or redness. No numbness or tingling. The pain does not wake her up at night. He is now any steroid injection or medication as able. She has no other concerns.   Review of Systems  All other systems reviewed and are negative.       Objective:  Physical Exam General: AAO x3, NAD  Dermatological: Skin is warm, dry and supple bilateral. Nails x 10 are well manicured; remaining integument appears unremarkable at this time. There are no open sores, no preulcerative lesions, no rash or signs of infection present.  Vascular: Dorsalis Pedis artery and Posterior Tibial artery pedal pulses are 2/4 bilateral with immedate capillary fill time. Pedal hair growth present. . There is no pain with calf compression, swelling, warmth, erythema.   Neruologic: Grossly intact via light touch bilateral. Vibratory intact via tuning fork bilateral. Protective threshold with Semmes Wienstein monofilament intact to all pedal sites bilateral. Negative tinel sign.   Musculoskeletal: Tenderness to palpation along the plantar medial tubercle of the calcaneus at the insertion of plantar fascia on the left foot. There is no pain along the course of the plantar fascia within the arch of the foot. Plantar fascia appears to be intact. There is no pain with lateral compression of the calcaneus or pain with vibratory sensation. There is no pain along the course or insertion of the achilles tendon. No other areas of tenderness to bilateral lower extremities. Muscular strength 5/5 in all groups tested  bilateral.  Gait: Unassisted, Nonantalgic.      Assessment:     76 year old female left heel pain likely result of plantar fasciitis    Plan:     -Treatment options discussed including all alternatives, risks, and complications -Etiology of symptoms were discussed -Declined anti-inflammatories other than over-the-counter endocrine steroid injection. -Plantar fascial brace was dispensed -Stretching, icing exercises daily -Discussed shoe gear modifications and orthotics -Follow-up as scheduled or sooner if needed.  Celesta Gentile, DPM

## 2017-06-27 ENCOUNTER — Telehealth: Payer: Self-pay | Admitting: Family Medicine

## 2017-06-27 NOTE — Telephone Encounter (Signed)
Caller name: [name],[name] Relation to pt: friend  Call back number:[phone]   Reason for call:  [name],[name] requesting most recent physical notes/labs results would like to pick up today, [name],[name] has a scheduled AWV at 10:30am,  please advise

## 2017-06-27 NOTE — Telephone Encounter (Signed)
Angel please disregard message mentioned below.  [name] please see below, regarding physical notes.

## 2017-07-22 ENCOUNTER — Encounter: Payer: Self-pay | Admitting: Podiatry

## 2017-07-22 ENCOUNTER — Ambulatory Visit (INDEPENDENT_AMBULATORY_CARE_PROVIDER_SITE_OTHER): Payer: Medicare Other | Admitting: Podiatry

## 2017-07-22 DIAGNOSIS — M722 Plantar fascial fibromatosis: Secondary | ICD-10-CM | POA: Diagnosis not present

## 2017-07-22 NOTE — Progress Notes (Signed)
Subjective: Angela Buck presents the office they for follow-up evaluation left heel pain. She states the plantar fascia brace does help however she states that also causes pain at times that she has taken off. She has been stretching icing. She does have braces been helping. She is interested in a massage to her feet with physical therapy or something to help relax the area. She'll to hold off on any medications of possible.Denies any systemic complaints such as fevers, chills, nausea, vomiting. No acute changes since last appointment, and no other complaints at this time.   Objective: AAO x3, NAD DP/PT pulses palpable bilaterally, CRT less than 3 seconds There is mild continued tenderness to palpation along the plantar medial tubercle of the calcaneus at the insertion of plantar fascia on the left foot. There is no pain along the course of the plantar fascia within the arch of the foot. Plantar fascia appears to be intact. There is no pain with lateral compression of the calcaneus or pain with vibratory sensation. There is no pain along the course or insertion of the achilles tendon. No other areas of tenderness to bilateral lower extremities. Equinus is present.  No open lesions or pre-ulcerative lesions.  No pain with calf compression, swelling, warmth, erythema  Assessment: Plantar fasciitis left foot  Plan: -All treatment options discussed with the patient including all alternatives, risks, complications.  -She wishes to hold off on any anti-inflammatory medications or steroid injections today. I discussed the physical therapy she wishes to proceed with this. A prescription provided today for benchmark however she may go some or closer to her house. -Discussed with her massage for this and gave her the information for Tharon Aquas -Night splint -Discussed shoe changes and orthotics.  -Patient encouraged to call the office with any questions, concerns, change in symptoms.   Celesta Gentile,  DPM

## 2017-07-22 NOTE — Patient Instructions (Signed)
Neuromuscular Therapy Kay Warren, LMBT [phone]  [address] [place]  

## 2017-09-04 ENCOUNTER — Other Ambulatory Visit: Payer: Self-pay | Admitting: Family Medicine

## 2017-10-10 ENCOUNTER — Ambulatory Visit: Payer: Medicare Other | Admitting: Family Medicine

## 2017-10-16 ENCOUNTER — Ambulatory Visit: Payer: Medicare Other | Admitting: Family Medicine

## 2017-10-16 ENCOUNTER — Encounter: Payer: Self-pay | Admitting: Family Medicine

## 2017-10-16 DIAGNOSIS — E785 Hyperlipidemia, unspecified: Secondary | ICD-10-CM

## 2017-10-16 DIAGNOSIS — D709 Neutropenia, unspecified: Secondary | ICD-10-CM

## 2017-10-16 DIAGNOSIS — K5901 Slow transit constipation: Secondary | ICD-10-CM | POA: Diagnosis not present

## 2017-10-16 DIAGNOSIS — M8580 Other specified disorders of bone density and structure, unspecified site: Secondary | ICD-10-CM

## 2017-10-16 DIAGNOSIS — E559 Vitamin D deficiency, unspecified: Secondary | ICD-10-CM

## 2017-10-16 DIAGNOSIS — E079 Disorder of thyroid, unspecified: Secondary | ICD-10-CM

## 2017-10-16 NOTE — Assessment & Plan Note (Signed)
Check CBC 

## 2017-10-16 NOTE — Assessment & Plan Note (Signed)
Encouraged heart healthy diet, increase exercise, avoid trans fats, consider a krill oil cap daily 

## 2017-10-16 NOTE — Assessment & Plan Note (Signed)
Check labs stay active

## 2017-10-16 NOTE — Patient Instructions (Signed)
CDC has a travel website  Hyperthyroidism Hyperthyroidism is when the thyroid is too active (overactive). Your thyroid is a large gland that is located in your neck. The thyroid helps to control how your body uses food (metabolism). When your thyroid is overactive, it produces too much of a hormone called thyroxine. What are the causes? Causes of hyperthyroidism may include:  Graves disease. This is when your immune system attacks the thyroid gland. This is the most common cause.  Inflammation of the thyroid gland.  Tumor in the thyroid gland or somewhere else.  Excessive use of thyroid medicines, including: ? Prescription thyroid supplement. ? Herbal supplements that mimic thyroid hormones.  Solid or fluid-filled lumps within your thyroid gland (thyroid nodules).  Excessive ingestion of iodine.  What increases the risk?  Being female.  Having a family history of thyroid conditions. What are the signs or symptoms? Signs and symptoms of hyperthyroidism may include:  Nervousness.  Inability to tolerate heat.  Unexplained weight loss.  Diarrhea.  Change in the texture of hair or skin.  Heart skipping beats or making extra beats.  Rapid heart rate.  Loss of menstruation.  Shaky hands.  Fatigue.  Restlessness.  Increased appetite.  Sleep problems.  Enlarged thyroid gland or nodules.  How is this diagnosed? Diagnosis of hyperthyroidism may include:  Medical history and physical exam.  Blood tests.  Ultrasound tests.  How is this treated? Treatment may include:  Medicines to control your thyroid.  Surgery to remove your thyroid.  Radiation therapy.  Follow these instructions at home:  Take medicines only as directed by your health care provider.  Do not use any tobacco products, including cigarettes, chewing tobacco, or electronic cigarettes. If you need help quitting, ask your health care provider.  Do not exercise or do physical activity  until your health care provider approves.  Keep all follow-up appointments as directed by your health care provider. This is important. Contact a health care provider if:  Your symptoms do not get better with treatment.  You have fever.  You are taking thyroid replacement medicine and you: ? Have depression. ? Feel mentally and physically slow. ? Have weight gain. Get help right away if:  You have decreased alertness or a change in your awareness.  You have abdominal pain.  You feel dizzy.  You have a rapid heartbeat.  You have an irregular heartbeat. This information is not intended to replace advice given to you by your health care provider. Make sure you discuss any questions you have with your health care provider. Document Released: 11/18/2005 Document Revised: 04/18/2016 Document Reviewed: 04/05/2014 Elsevier Interactive Patient Education  2017 [employer].

## 2017-10-16 NOTE — Assessment & Plan Note (Signed)
Tolerating Armour thyroid check level today

## 2017-10-16 NOTE — Assessment & Plan Note (Signed)
Takes 3 sprays of a vitamin D and a A + D oral supplement cap daily. Check level

## 2017-10-16 NOTE — Progress Notes (Signed)
Subjective:  I acted as a Education administrator for Angela Buck. Angela Buck, [place]   Patient ID: Angela Buck, female    DOB: 1941-02-28, 76 y.o.   MRN: [id number]  Chief Complaint  Patient presents with  . Follow-up    HPI  Patient is in today for 6 month follow up. She feels well today. No recent febrile illness or hospitalization. She has recently returned from a visit with her naturopath up in [place] and hey did not note any concerning findings. Denies CP/palp/SOB/HA/congestion/fevers/GI or GU c/o. Taking meds as prescribed  Patient Care Team: Angela Lukes, MD as PCP - General (Family Medicine) Angela Lukes, MD (Family Medicine) Angela Pigg, MD as Consulting Physician (Dermatology)   Past Medical History:  Diagnosis Date  . Abdominal pain 03/16/2012  . 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 Chicken pox as a child  . Colonic polyp 01/05/2012  . Elevated BP 01/05/2012  . Fatigue 03/16/2012  . History of mumps   . Leukopenia 05/18/2014  . Mass of axilla 01/05/2012  . Measles as a child  . Medicare annual wellness visit, subsequent 01/05/2012   Sees Dr Collene Mares of Gastroenterology   . Neutropenia ([place]) 10/07/2016  . Osteopenia 01/05/2012  . Other and unspecified hyperlipidemia 05/18/2014  . Pain of left heel 04/07/2017  . Preventative health care 01/05/2012  . Preventative health care 10/07/2016  . Skin cancer 12/02/2009  . Thyroid disease 03/16/2012   right   . Vitamin D deficiency 04/07/2017    Past Surgical History:  Procedure Laterality Date  . biopsy of jawline  2011  . CATARACT EXTRACTION, BILATERAL     b/l  . laproscopy  1970   abdominal, endometriosis, with D&C  . right hip replacement  2003  . TONSILLECTOMY AND ADENOIDECTOMY  1949    Family History  Problem Relation Age of Onset  . Arthritis Mother   . Hypertension Mother   . Dementia Mother   . Heart attack Mother   . Glaucoma Mother   . Heart disease Mother        MI, stent in 2004  . Emphysema Father    smoker  . Hypertension Father   . Glaucoma Father   . COPD Father   . Other Paternal Grandfather        enlarged heart  . Ulcers Maternal Grandmother     Social History   Socioeconomic History  . Marital status: Divorced    Spouse name: Not on file  . Number of children: Not on file  . Years of education: Not on file  . Highest education level: Not on file  Social Needs  . Financial resource strain: Not on file  . Food insecurity - worry: Not on file  . Food insecurity - inability: Not on file  . Transportation needs - medical: Not on file  . Transportation needs - non-medical: Not on file  Occupational History  . Not on file  Tobacco Use  . Smoking status: Never Smoker  . Smokeless tobacco: Never Used  Substance and Sexual Activity  . Alcohol use: Yes    Comment: occasional wine  . Drug use: No  . Sexual activity: No    Partners: Female    Comment: lives with partner, no major dietary restrictions.   Other Topics Concern  . Not on file  Social History Narrative  . Not on file    Outpatient Medications Prior to Visit  Medication Sig Dispense Refill  .  ARMOUR THYROID 15 MG tablet TAKE 1 TABLET DAILY FOR 5 DAYS A WEEK, AND 2 TABLETS DAILY ON TUESDAY AND SATURDAYS 120 tablet 0  . thyroid (ARMOUR THYROID) 15 MG tablet Take 1 tablet (15 mg total) by mouth daily. (Patient taking differently: Take 1 tablet every day , but on Saturday and Wednesday take 2 by mouth) 90 tablet 1  . Vitamin D, Ergocalciferol, (DRISDOL) 50000 units CAPS capsule Take 1 capsule (50,000 Units total) by mouth every 7 (seven) days. 4 capsule 4   No facility-administered medications prior to visit.     Allergies  Allergen Reactions  . Bee Venom Anaphylaxis  . Penicillins Hives    Has patient had a PCN reaction causing immediate rash, facial/tongue/throat swelling, SOB or lightheadedness with hypotension: No Has patient had a PCN reaction causing severe rash involving mucus membranes or skin  necrosis: No Has patient had a PCN reaction that required hospitalization No Has patient had a PCN reaction occurring within the last 10 years: No If all of the above answers are "NO", then may proceed with Cephalosporin use.    ROS     Objective:    Physical Exam  BP 125/61 (BP Location: Left Arm, Patient Position: Sitting, Cuff Size: Small)   Pulse 71   Temp 97.8 F (36.6 C) (Oral)   Resp 16   Ht 5' 4.17" (1.63 m)   Wt 140 lb 3.2 oz (63.6 kg)   SpO2 99%   BMI 23.94 kg/m  Wt Readings from Last 3 Encounters:  10/16/17 140 lb 3.2 oz (63.6 kg)  04/07/17 137 lb (62.1 kg)  10/07/16 134 lb 4 oz (60.9 kg)   BP Readings from Last 3 Encounters:  10/16/17 125/61  04/07/17 (!) 100/58  10/07/16 108/68      There is no immunization history on file for this patient.  Health Maintenance  Topic Date Due  . TETANUS/TDAP  06/17/1962  . PNA vac Low Risk Adult (1 of 2 - PCV13) 06/17/2008  . MAMMOGRAM  02/21/2017  . INFLUENZA VACCINE  11/24/2017 (Originally 07/02/2017)  . COLONOSCOPY  12/05/2020  . DEXA SCAN  Completed    Lab Results  Component Value Date   WBC 5.7 10/16/2017   HGB 12.4 10/16/2017   HCT 38.2 10/16/2017   PLT 238.0 10/16/2017   GLUCOSE 114 (H) 10/16/2017   CHOL 182 04/07/2017   TRIG 111.0 04/07/2017   HDL 82.10 04/07/2017   LDLCALC 78 04/07/2017   ALT 12 10/16/2017   AST 23 10/16/2017   NA 140 10/16/2017   K 4.0 10/16/2017   CL 102 10/16/2017   CREATININE 1.04 10/16/2017   BUN 27 (H) 10/16/2017   CO2 31 10/16/2017   TSH 4.25 10/16/2017    Lab Results  Component Value Date   TSH 4.25 10/16/2017   Lab Results  Component Value Date   WBC 5.7 10/16/2017   HGB 12.4 10/16/2017   HCT 38.2 10/16/2017   MCV 95.0 10/16/2017   PLT 238.0 10/16/2017   Lab Results  Component Value Date   NA 140 10/16/2017   K 4.0 10/16/2017   CO2 31 10/16/2017   GLUCOSE 114 (H) 10/16/2017   BUN 27 (H) 10/16/2017   CREATININE 1.04 10/16/2017   BILITOT 0.4 10/16/2017     ALKPHOS 63 10/16/2017   AST 23 10/16/2017   ALT 12 10/16/2017   PROT 6.5 10/16/2017   ALBUMIN 3.8 10/16/2017   CALCIUM 9.4 10/16/2017   GFR 55.01 (L) 10/16/2017   Lab  Results  Component Value Date   CHOL 182 04/07/2017   Lab Results  Component Value Date   HDL 82.10 04/07/2017   Lab Results  Component Value Date   LDLCALC 78 04/07/2017   Lab Results  Component Value Date   TRIG 111.0 04/07/2017   Lab Results  Component Value Date   CHOLHDL 2 04/07/2017   No results found for: HGBA1C       Assessment & Plan:   Problem List Items Addressed This Visit    Osteopenia    Check labs stay active      Relevant Orders   Comprehensive metabolic panel (Completed)   Thyroid disease    Tolerating Armour thyroid check level today      Relevant Orders   TSH (Completed)   T4, free (Completed)   Hyperlipidemia, mild    Encouraged heart healthy diet, increase exercise, avoid trans fats, consider a krill oil cap daily      Constipation by delayed colonic transit    Encouraged increased hydration and fiber in diet. Daily probiotics. If bowels not moving can use MOM 2 tbls po in 4 oz of warm prune juice by mouth every 2-3 days. If no results then repeat in 4 hours with  Dulcolax suppository pr, may repeat again in 4 more hours as needed.       Neutropenia (HCC)    Check CBC      Relevant Orders   CBC (Completed)   Vitamin D deficiency    Takes 3 sprays of a vitamin D and a A + D oral supplement cap daily. Check level      Relevant Orders   VITAMIN D 25 Hydroxy (Vit-D Deficiency, Fractures) (Completed)      I am having Angela Buck maintain her thyroid, Vitamin D (Ergocalciferol), and ARMOUR THYROID.  No orders of the defined types were placed in this encounter.   CMA served as Education administrator during this visit. History, Physical and Plan performed by medical provider. Documentation and orders reviewed and attested to.  Penni Homans, MD

## 2017-10-17 LAB — TSH: TSH: 4.25 u[IU]/mL (ref 0.35–4.50)

## 2017-10-17 LAB — COMPREHENSIVE METABOLIC PANEL
ALT: 12 U/L (ref 0–35)
AST: 23 U/L (ref 0–37)
Albumin: 3.8 g/dL (ref 3.5–5.2)
Alkaline Phosphatase: 63 U/L (ref 39–117)
BUN: 27 mg/dL — ABNORMAL HIGH (ref 6–23)
CO2: 31 mEq/L (ref 19–32)
Calcium: 9.4 mg/dL (ref 8.4–10.5)
Chloride: 102 mEq/L (ref 96–112)
Creatinine, Ser: 1.04 mg/dL (ref 0.40–1.20)
GFR: 55.01 mL/min — ABNORMAL LOW (ref >60.00)
Glucose, Bld: 114 mg/dL — ABNORMAL HIGH (ref 70–99)
Potassium: 4 mEq/L (ref 3.5–5.1)
Sodium: 140 mEq/L (ref 135–145)
Total Bilirubin: 0.4 mg/dL (ref 0.2–1.2)
Total Protein: 6.5 g/dL (ref 6.0–8.3)

## 2017-10-17 LAB — CBC
HCT: 38.2 % (ref 36.0–46.0)
Hemoglobin: 12.4 g/dL (ref 12.0–15.0)
MCHC: 32.3 g/dL (ref 30.0–36.0)
MCV: 95 fl (ref 78.0–100.0)
Platelets: 238 10*3/uL (ref 150.0–400.0)
RBC: 4.02 Mil/uL (ref 3.87–5.11)
RDW: 13.6 % (ref 11.5–15.5)
WBC: 5.7 10*3/uL (ref 4.0–10.5)

## 2017-10-17 LAB — T4, FREE: Free T4: 0.82 ng/dL (ref 0.60–1.60)

## 2017-10-17 LAB — VITAMIN D 25 HYDROXY (VIT D DEFICIENCY, FRACTURES): VITD: 41.74 ng/mL (ref 30.00–100.00)

## 2017-10-19 NOTE — Assessment & Plan Note (Signed)
Encouraged increased hydration and fiber in diet. Daily probiotics. If bowels not moving can use MOM 2 tbls po in 4 oz of warm prune juice by mouth every 2-3 days. If no results then repeat in 4 hours with  Dulcolax suppository pr, may repeat again in 4 more hours as needed.  

## 2017-12-12 ENCOUNTER — Other Ambulatory Visit: Payer: Self-pay | Admitting: Family Medicine

## 2018-02-19 ENCOUNTER — Encounter: Payer: Self-pay | Admitting: Family Medicine

## 2018-02-19 DIAGNOSIS — E079 Disorder of thyroid, unspecified: Secondary | ICD-10-CM

## 2018-02-19 DIAGNOSIS — R739 Hyperglycemia, unspecified: Secondary | ICD-10-CM

## 2018-02-19 NOTE — Telephone Encounter (Signed)
Opened in error

## 2018-02-24 ENCOUNTER — Encounter: Payer: Self-pay | Admitting: Family Medicine

## 2018-02-24 ENCOUNTER — Other Ambulatory Visit (INDEPENDENT_AMBULATORY_CARE_PROVIDER_SITE_OTHER): Payer: Medicare Other

## 2018-02-24 DIAGNOSIS — E079 Disorder of thyroid, unspecified: Secondary | ICD-10-CM | POA: Diagnosis not present

## 2018-02-24 DIAGNOSIS — R739 Hyperglycemia, unspecified: Secondary | ICD-10-CM | POA: Diagnosis not present

## 2018-02-24 LAB — COMPREHENSIVE METABOLIC PANEL
ALT: 17 U/L (ref 0–35)
AST: 26 U/L (ref 0–37)
Albumin: 3.9 g/dL (ref 3.5–5.2)
Alkaline Phosphatase: 64 U/L (ref 39–117)
BUN: 20 mg/dL (ref 6–23)
CO2: 30 mEq/L (ref 19–32)
Calcium: 9.4 mg/dL (ref 8.4–10.5)
Chloride: 104 mEq/L (ref 96–112)
Creatinine, Ser: 0.91 mg/dL (ref 0.40–1.20)
GFR: 64.11 mL/min (ref >60.00)
Glucose, Bld: 99 mg/dL (ref 70–99)
Potassium: 4.8 mEq/L (ref 3.5–5.1)
Sodium: 139 mEq/L (ref 135–145)
Total Bilirubin: 0.6 mg/dL (ref 0.2–1.2)
Total Protein: 6.8 g/dL (ref 6.0–8.3)

## 2018-02-24 LAB — T3, FREE: T3, Free: 2.7 pg/mL (ref 2.3–4.2)

## 2018-02-24 LAB — T4, FREE: Free T4: 0.81 ng/dL (ref 0.60–1.60)

## 2018-02-24 LAB — HEMOGLOBIN A1C: Hgb A1c MFr Bld: 5.7 % (ref 4.6–6.5)

## 2018-02-24 LAB — TSH: TSH: 10.48 u[IU]/mL — ABNORMAL HIGH (ref 0.35–4.50)

## 2018-03-10 ENCOUNTER — Other Ambulatory Visit: Payer: Self-pay | Admitting: Family Medicine

## 2018-03-22 ENCOUNTER — Encounter: Payer: Self-pay | Admitting: Family Medicine

## 2018-03-31 ENCOUNTER — Other Ambulatory Visit: Payer: Self-pay

## 2018-03-31 MED ORDER — ARMOUR THYROID 15 MG PO TABS: ORAL_TABLET | ORAL | 1 refills | Status: DC

## 2018-04-07 NOTE — Progress Notes (Signed)
Subjective:   Angela Buck is a 77 y.o. female who presents for Medicare Annual (Subsequent) preventive examination.  Review of Systems: No ROS.  Medicare Wellness Visit. Additional risk factors are reflected in the social history.  Cardiac Risk Factors include: advanced age (>12men, >39 women);dyslipidemia Sleep patterns: varies. 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 Home Safety/Smoke Alarms: Feels safe in home. Smoke alarms in place.  Living environment; residence and Adult nurse: lives with partner. One story. Seat Belt Safety/Bike Helmet: Wears seat belt.   Female:    Mammo-   ordered    Dexa scan- ordered       CCS-2012     Objective:     Vitals: BP (!) 104/58 (BP Location: Left Arm, Patient Position: Sitting, Cuff Size: Normal)   Pulse 61   Ht 5\' 4"  (1.626 m)   Wt 138 lb 9.6 oz (62.9 kg)   SpO2 97%   BMI 23.79 kg/m   Body mass index is 23.79 kg/m.  Advanced Directives 04/09/2018 04/07/2017 04/07/2016 09/29/2015 09/15/2014  Does Patient Have a Medical Advance Directive? Yes Yes Yes Yes Yes  Type of Paramedic of [place];Living will [place];Living will [place];Living will [place];Living will -  Copy of [place] in Chart? Yes No - copy requested No - copy requested Yes -    Tobacco Social History   Tobacco Use  Smoking Status Never Smoker  Smokeless Tobacco Never Used     Counseling given: Not Answered   Clinical Intake:  Pain : No/denies pain    Past Medical History:  Diagnosis Date  . Abdominal pain 03/16/2012  . 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 Chicken pox as a child  . Colonic polyp 01/05/2012  . Elevated BP 01/05/2012  . Fatigue 03/16/2012  . History of mumps   . Leukopenia 05/18/2014  . Mass of axilla 01/05/2012  . Measles as a child  . Medicare annual wellness visit, subsequent 01/05/2012   Sees Dr  Collene Mares of Gastroenterology   . Neutropenia ([place]) 10/07/2016  . Osteopenia 01/05/2012  . Other and unspecified hyperlipidemia 05/18/2014  . Pain of left heel 04/07/2017  . Preventative health care 01/05/2012  . Preventative health care 10/07/2016  . Skin cancer 12/02/2009  . Thyroid disease 03/16/2012   right   . Vitamin D deficiency 04/07/2017   Past Surgical History:  Procedure Laterality Date  . biopsy of jawline  2011  . CATARACT EXTRACTION, BILATERAL     b/l  . laproscopy  1970   abdominal, endometriosis, with D&C  . right hip replacement  2003  . TONSILLECTOMY AND ADENOIDECTOMY  1949   Family History  Problem Relation Age of Onset  . Arthritis Mother   . Hypertension Mother   . Dementia Mother   . Heart attack Mother   . Glaucoma Mother   . Heart disease Mother        MI, stent in 2004  . Emphysema Father        smoker  . Hypertension Father   . Glaucoma Father   . COPD Father   . Other Paternal Grandfather        enlarged heart  . Ulcers Maternal Grandmother    Social History   Socioeconomic History  . Marital status: Divorced    Spouse name: Not on file  . Number of children: Not on file  . Years  of education: Not on file  . Highest education level: Not on file  Occupational History  . Not on file  Social Needs  . Financial resource strain: Not on file  . Food insecurity:    Worry: Not on file    Inability: Not on file  . Transportation needs:    Medical: Not on file    Non-medical: Not on file  Tobacco Use  . Smoking status: Never Smoker  . Smokeless tobacco: Never Used  Substance and Sexual Activity  . Alcohol use: Yes    Comment: occasional wine  . Drug use: No  . Sexual activity: Never    Partners: Female    Comment: lives with partner, no major dietary restrictions.   Lifestyle  . Physical activity:    Days per week: Not on file    Minutes per session: Not on file  . Stress: Not on file  Relationships  . Social connections:    Talks on phone: Not  on file    Gets together: Not on file    Attends religious service: Not on file    Active member of club or organization: Not on file    Attends meetings of clubs or organizations: Not on file    Relationship status: Not on file  Other Topics Concern  . Not on file  Social History Narrative  . Not on file    Outpatient Encounter Medications as of 04/09/2018  Medication Sig  . ARMOUR THYROID 15 MG tablet Take 2 tablets on Tuesday Thursday Saturday then take 1 tablet other days  . Vitamin D, Ergocalciferol, (DRISDOL) 50000 units CAPS capsule Take 1 capsule (50,000 Units total) by mouth every 7 (seven) days.   No facility-administered encounter medications on file as of 04/09/2018.     Activities of Daily Living In your present state of health, do you have any difficulty performing the following activities: 04/09/2018  Hearing? N  Vision? N  Comment wears reading glasses. hx cataract sx.   Difficulty concentrating or making decisions? N  Walking or climbing stairs? N  Dressing or bathing? N  Doing errands, shopping? N  Preparing Food and eating ? N  Using the Toilet? N  In the past six months, have you accidently leaked urine? N  Do you have problems with loss of bowel control? N  Managing your Medications? N  Managing your Finances? N  Housekeeping or managing your Housekeeping? N  Some recent data might be hidden    Patient Care Team: Mosie Lukes, MD as PCP - General (Family Medicine) Mosie Lukes, MD (Family Medicine) Jari Pigg, MD as Consulting Physician (Dermatology)    Assessment:   This is a routine wellness examination for Angela Buck. Physical assessment deferred to PCP.  Exercise Activities and Dietary recommendations Current Exercise Habits: Home exercise routine;Structured exercise class, Type of exercise: stretching;calisthenics(pool and cycling), Time (Minutes): 60, Frequency (Times/Week): 5, Weekly Exercise (Minutes/Week): 300, Intensity: Moderate   Diet  (meal preparation, eat out, water intake, caffeinated beverages, dairy products, fruits and vegetables): in general, a "healthy" diet     Goals    . Continue leisure travel    . maintain active lifestyle.       Fall Risk Fall Risk  04/09/2018 04/07/2017 10/07/2016 09/29/2015 09/15/2014  Falls in the past year? No No No No No    Depression Screen PHQ 2/9 Scores 04/09/2018 04/07/2017 10/07/2016 09/29/2015  PHQ - 2 Score 0 0 0 0     Cognitive Function  Ad8 score reviewed for issues:  Issues making decisions:no  Less interest in hobbies / activities:no  Repeats questions, stories (family complaining):no  Trouble using ordinary gadgets (microwave, computer, phone):no  Forgets the month or year: no  Mismanaging finances: no  Remembering appts:no  Daily problems with thinking and/or memory:no Ad8 score is=0          There is no immunization history on file for this patient.    Screening Tests Health Maintenance  Topic Date Due  . TETANUS/TDAP  06/17/1962  . PNA vac Low Risk Adult (1 of 2 - PCV13) 06/17/2008  . MAMMOGRAM  02/21/2017  . INFLUENZA VACCINE  07/02/2018  . COLONOSCOPY  12/05/2020  . DEXA SCAN  Completed       Plan:   Follow up with Dr.Blyth 04/20/18.  Continue to eat heart healthy diet (full of fruits, vegetables, whole grains, lean protein, water--limit salt, fat, and sugar intake) and increase physical activity as tolerated.  Continue doing brain stimulating activities (puzzles, reading, adult coloring books, staying active) to keep memory sharp.   I have ordered your bone density and mammogram. Please schedule.   I have personally reviewed and noted the following in the patient's chart:   . Medical and social history . Use of alcohol, tobacco or illicit drugs  . Current medications and supplements . Functional ability and status . Nutritional status . Physical activity . Advanced directives . List of other physicians . Hospitalizations,  surgeries, and ER visits in previous 12 months . Vitals . Screenings to include cognitive, depression, and falls . Referrals and appointments  In addition, I have reviewed and discussed with patient certain preventive protocols, quality metrics, and best practice recommendations. A written personalized care plan for preventive services as well as general preventive health recommendations were provided to patient.     Shela Nevin, [place]  04/09/2018

## 2018-04-09 ENCOUNTER — Encounter: Payer: Self-pay | Admitting: *Deleted

## 2018-04-09 ENCOUNTER — Ambulatory Visit (INDEPENDENT_AMBULATORY_CARE_PROVIDER_SITE_OTHER): Payer: Medicare Other | Admitting: *Deleted

## 2018-04-09 VITALS — BP 104/58 | HR 61 | Ht 64.0 in | Wt 138.6 lb

## 2018-04-09 DIAGNOSIS — Z1231 Encounter for screening mammogram for malignant neoplasm of breast: Secondary | ICD-10-CM | POA: Diagnosis not present

## 2018-04-09 DIAGNOSIS — Z780 Asymptomatic menopausal state: Secondary | ICD-10-CM

## 2018-04-09 DIAGNOSIS — Z0000 Encounter for general adult medical examination without abnormal findings: Secondary | ICD-10-CM

## 2018-04-09 DIAGNOSIS — Z1239 Encounter for other screening for malignant neoplasm of breast: Secondary | ICD-10-CM

## 2018-04-09 NOTE — Patient Instructions (Signed)
Follow up with Dr.Blyth 04/20/18.  Continue to eat heart healthy diet (full of fruits, vegetables, whole grains, lean protein, water--limit salt, fat, and sugar intake) and increase physical activity as tolerated.  Continue doing brain stimulating activities (puzzles, reading, adult coloring books, staying active) to keep memory sharp.   I have ordered your bone density and mammogram. Please schedule.    Angela Buck , Thank you for taking time to come for your Medicare Wellness Visit. I appreciate your ongoing commitment to your health goals. Please review the following plan we discussed and let me know if I can assist you in the future.   These are the goals we discussed: Goals    . Continue leisure travel    . maintain active lifestyle.       This is a list of the screening recommended for you and due dates:  Health Maintenance  Topic Date Due  . Tetanus Vaccine  06/17/1962  . Pneumonia vaccines (1 of 2 - PCV13) 06/17/2008  . Mammogram  02/21/2017  . Flu Shot  07/02/2018  . Colon Cancer Screening  12/05/2020  . DEXA scan (bone density measurement)  Completed    Health Maintenance for Postmenopausal Women Menopause is a normal process in which your reproductive ability comes to an end. This process happens gradually over a span of months to years, usually between the ages of 12 and 39. Menopause is complete when you have missed 12 consecutive menstrual periods. It is important to talk with your health care provider about some of the most common conditions that affect postmenopausal women, such as heart disease, cancer, and bone loss (osteoporosis). Adopting a healthy lifestyle and getting preventive care can help to promote your health and wellness. Those actions can also lower your chances of developing some of these common conditions. What should I know about menopause? During menopause, you may experience a number of symptoms, such as:  Moderate-to-severe hot flashes.  Night  sweats.  Decrease in sex drive.  Mood swings.  Headaches.  Tiredness.  Irritability.  Memory problems.  Insomnia.  Choosing to treat or not to treat menopausal changes is an individual decision that you make with your health care provider. What should I know about hormone replacement therapy and supplements? Hormone therapy products are effective for treating symptoms that are associated with menopause, such as hot flashes and night sweats. Hormone replacement carries certain risks, especially as you become older. If you are thinking about using estrogen or estrogen with progestin treatments, discuss the benefits and risks with your health care provider. What should I know about heart disease and stroke? Heart disease, heart attack, and stroke become more likely as you age. This may be due, in part, to the hormonal changes that your body experiences during menopause. These can affect how your body processes dietary fats, triglycerides, and cholesterol. Heart attack and stroke are both medical emergencies. There are many things that you can do to help prevent heart disease and stroke:  Have your blood pressure checked at least every 1-2 years. High blood pressure causes heart disease and increases the risk of stroke.  If you are 58-48 years old, ask your health care provider if you should take aspirin to prevent a heart attack or a stroke.  Do not use any tobacco products, including cigarettes, chewing tobacco, or electronic cigarettes. If you need help quitting, ask your health care provider.  It is important to eat a healthy diet and maintain a healthy weight. ? Be sure  to include plenty of vegetables, fruits, low-fat dairy products, and lean protein. ? Avoid eating foods that are high in solid fats, added sugars, or salt (sodium).  Get regular exercise. This is one of the most important things that you can do for your health. ? Try to exercise for at least 150 minutes each week.  The type of exercise that you do should increase your heart rate and make you sweat. This is known as moderate-intensity exercise. ? Try to do strengthening exercises at least twice each week. Do these in addition to the moderate-intensity exercise.  Know your numbers.Ask your health care provider to check your cholesterol and your blood glucose. Continue to have your blood tested as directed by your health care provider.  What should I know about cancer screening? There are several types of cancer. Take the following steps to reduce your risk and to catch any cancer development as early as possible. Breast Cancer  Practice breast self-awareness. ? This means understanding how your breasts normally appear and feel. ? It also means doing regular breast self-exams. Let your health care provider know about any changes, no matter how small.  If you are 31 or older, have a clinician do a breast exam (clinical breast exam or CBE) every year. Depending on your age, family history, and medical history, it may be recommended that you also have a yearly breast X-ray (mammogram).  If you have a family history of breast cancer, talk with your health care provider about genetic screening.  If you are at high risk for breast cancer, talk with your health care provider about having an MRI and a mammogram every year.  Breast cancer (BRCA) gene test is recommended for women who have family members with BRCA-related cancers. Results of the assessment will determine the need for genetic counseling and BRCA1 and for BRCA2 testing. BRCA-related cancers include these types: ? Breast. This occurs in males or females. ? Ovarian. ? Tubal. This may also be called fallopian tube cancer. ? Cancer of the abdominal or pelvic lining (peritoneal cancer). ? Prostate. ? Pancreatic.  Cervical, Uterine, and Ovarian Cancer Your health care provider may recommend that you be screened regularly for cancer of the pelvic  organs. These include your ovaries, uterus, and vagina. This screening involves a pelvic exam, which includes checking for microscopic changes to the surface of your cervix (Pap test).  For women ages 21-65, health care providers may recommend a pelvic exam and a Pap test every three years. For women ages 67-65, they may recommend the Pap test and pelvic exam, combined with testing for human papilloma virus (HPV), every five years. Some types of HPV increase your risk of cervical cancer. Testing for HPV may also be done on women of any age who have unclear Pap test results.  Other health care providers may not recommend any screening for nonpregnant women who are considered low risk for pelvic cancer and have no symptoms. Ask your health care provider if a screening pelvic exam is right for you.  If you have had past treatment for cervical cancer or a condition that could lead to cancer, you need Pap tests and screening for cancer for at least 20 years after your treatment. If Pap tests have been discontinued for you, your risk factors (such as having a new sexual partner) need to be reassessed to determine if you should start having screenings again. Some women have medical problems that increase the chance of getting cervical cancer. In these  cases, your health care provider may recommend that you have screening and Pap tests more often.  If you have a family history of uterine cancer or ovarian cancer, talk with your health care provider about genetic screening.  If you have vaginal bleeding after reaching menopause, tell your health care provider.  There are currently no reliable tests available to screen for ovarian cancer.  Lung Cancer Lung cancer screening is recommended for adults 46-30 years old who are at high risk for lung cancer because of a history of smoking. A yearly low-dose CT scan of the lungs is recommended if you:  Currently smoke.  Have a history of at least 30 pack-years of  smoking and you currently smoke or have quit within the past 15 years. A pack-year is smoking an average of one pack of cigarettes per day for one year.  Yearly screening should:  Continue until it has been 15 years since you quit.  Stop if you develop a health problem that would prevent you from having lung cancer treatment.  Colorectal Cancer  This type of cancer can be detected and can often be prevented.  Routine colorectal cancer screening usually begins at age 46 and continues through age 24.  If you have risk factors for colon cancer, your health care provider may recommend that you be screened at an earlier age.  If you have a family history of colorectal cancer, talk with your health care provider about genetic screening.  Your health care provider may also recommend using home test kits to check for hidden blood in your stool.  A small camera at the end of a tube can be used to examine your colon directly (sigmoidoscopy or colonoscopy). This is done to check for the earliest forms of colorectal cancer.  Direct examination of the colon should be repeated every 5-10 years until age 68. However, if early forms of precancerous polyps or small growths are found or if you have a family history or genetic risk for colorectal cancer, you may need to be screened more often.  Skin Cancer  Check your skin from head to toe regularly.  Monitor any moles. Be sure to tell your health care provider: ? About any new moles or changes in moles, especially if there is a change in a mole's shape or color. ? If you have a mole that is larger than the size of a pencil eraser.  If any of your family members has a history of skin cancer, especially at a young age, talk with your health care provider about genetic screening.  Always use sunscreen. Apply sunscreen liberally and repeatedly throughout the day.  Whenever you are outside, protect yourself by wearing long sleeves, pants, a wide-brimmed  hat, and sunglasses.  What should I know about osteoporosis? Osteoporosis is a condition in which bone destruction happens more quickly than new bone creation. After menopause, you may be at an increased risk for osteoporosis. To help prevent osteoporosis or the bone fractures that can happen because of osteoporosis, the following is recommended:  If you are 58-12 years old, get at least 1,000 mg of calcium and at least 600 mg of vitamin D per day.  If you are older than age 18 but younger than age 57, get at least 1,200 mg of calcium and at least 600 mg of vitamin D per day.  If you are older than age 22, get at least 1,200 mg of calcium and at least 800 mg of vitamin D per  day.  Smoking and excessive alcohol intake increase the risk of osteoporosis. Eat foods that are rich in calcium and vitamin D, and do weight-bearing exercises several times each week as directed by your health care provider. What should I know about how menopause affects my mental health? Depression may occur at any age, but it is more common as you become older. Common symptoms of depression include:  Low or sad mood.  Changes in sleep patterns.  Changes in appetite or eating patterns.  Feeling an overall lack of motivation or enjoyment of activities that you previously enjoyed.  Frequent crying spells.  Talk with your health care provider if you think that you are experiencing depression. What should I know about immunizations? It is important that you get and maintain your immunizations. These include:  Tetanus, diphtheria, and pertussis (Tdap) booster vaccine.  Influenza every year before the flu season begins.  Pneumonia vaccine.  Shingles vaccine.  Your health care provider may also recommend other immunizations. This information is not intended to replace advice given to you by your health care provider. Make sure you discuss any questions you have with your health care provider. Document Released:  01/10/2006 Document Revised: 06/07/2016 Document Reviewed: 08/22/2015 Elsevier Interactive Patient Education  2018 Elsevier Inc.  

## 2018-04-13 ENCOUNTER — Ambulatory Visit (HOSPITAL_BASED_OUTPATIENT_CLINIC_OR_DEPARTMENT_OTHER)
Admission: RE | Admit: 2018-04-13 | Discharge: 2018-04-13 | Disposition: A | Discharge status: Home / Self Care | Payer: Medicare Other | Source: Ambulatory Visit | Attending: Family Medicine | Admitting: Family Medicine

## 2018-04-13 ENCOUNTER — Other Ambulatory Visit: Payer: Self-pay | Admitting: Family Medicine

## 2018-04-13 DIAGNOSIS — M810 Age-related osteoporosis without current pathological fracture: Secondary | ICD-10-CM | POA: Diagnosis not present

## 2018-04-13 DIAGNOSIS — Z1239 Encounter for other screening for malignant neoplasm of breast: Secondary | ICD-10-CM

## 2018-04-13 DIAGNOSIS — Z1231 Encounter for screening mammogram for malignant neoplasm of breast: Secondary | ICD-10-CM | POA: Insufficient documentation

## 2018-04-13 DIAGNOSIS — Z780 Asymptomatic menopausal state: Secondary | ICD-10-CM | POA: Insufficient documentation

## 2018-04-20 ENCOUNTER — Encounter: Payer: Self-pay | Admitting: Family Medicine

## 2018-04-20 ENCOUNTER — Ambulatory Visit (INDEPENDENT_AMBULATORY_CARE_PROVIDER_SITE_OTHER): Payer: Medicare Other | Admitting: Family Medicine

## 2018-04-20 VITALS — BP 102/58 | HR 50 | Temp 97.8°F | Resp 18 | Wt 136.6 lb

## 2018-04-20 DIAGNOSIS — E079 Disorder of thyroid, unspecified: Secondary | ICD-10-CM

## 2018-04-20 DIAGNOSIS — Z0000 Encounter for general adult medical examination without abnormal findings: Secondary | ICD-10-CM | POA: Diagnosis not present

## 2018-04-20 DIAGNOSIS — B009 Herpesviral infection, unspecified: Secondary | ICD-10-CM

## 2018-04-20 DIAGNOSIS — E559 Vitamin D deficiency, unspecified: Secondary | ICD-10-CM

## 2018-04-20 DIAGNOSIS — E785 Hyperlipidemia, unspecified: Secondary | ICD-10-CM

## 2018-04-20 DIAGNOSIS — R739 Hyperglycemia, unspecified: Secondary | ICD-10-CM | POA: Insufficient documentation

## 2018-04-20 HISTORY — DX: Herpesviral infection, unspecified: B00.9

## 2018-04-20 NOTE — Progress Notes (Signed)
Subjective:  I acted as a Education administrator for Dr. Charlett Blake. [name], [place]  Patient ID: Angela Buck, female    DOB: May 28, 1941, 77 y.o.   MRN: [id number]  Chief Complaint  Patient presents with  . Annual Exam    HPI  Patient is in today for an annual exam and follow up on chronic medical concerns including hypothyroidism, hyperlipidemia and Vitamin D deficiency. No recent febrile illness or hospitalization. She is under a great deal of stress as she is packing up a house in preparation of seeling and moving to [facility]. They are having a [place] built. She is eating well and staying active. No recent acute concerns. Denies CP/palp/SOB/HA/congestion/fevers/GI or GU c/o. Taking meds as prescribed  Patient Care Team: Mosie Lukes, MD as PCP - General (Family Medicine) Mosie Lukes, MD (Family Medicine) Jari Pigg, MD as Consulting Physician (Dermatology)   Past Medical History:  Diagnosis Date  . Abdominal pain 03/16/2012  . 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 Chicken pox as a child  . Colonic polyp 01/05/2012  . Elevated BP 01/05/2012  . Fatigue 03/16/2012  . History of mumps   . Leukopenia 05/18/2014  . Mass of axilla 01/05/2012  . Measles as a child  . Medicare annual wellness visit, subsequent 01/05/2012   Sees Dr Collene Mares of Gastroenterology   . Neutropenia ([place]) 10/07/2016  . Osteopenia 01/05/2012  . Other and unspecified hyperlipidemia 05/18/2014  . Pain of left heel 04/07/2017  . Preventative health care 01/05/2012  . Preventative health care 10/07/2016  . Skin cancer 12/02/2009  . Thyroid disease 03/16/2012   right   . Vitamin D deficiency 04/07/2017    Past Surgical History:  Procedure Laterality Date  . biopsy of jawline  2011  . CATARACT EXTRACTION, BILATERAL     b/l  . laproscopy  1970   abdominal, endometriosis, with D&C  . right hip replacement  2003  . TONSILLECTOMY AND ADENOIDECTOMY  1949    Family History  Problem Relation Age of Onset  . Arthritis  Mother   . Hypertension Mother   . Dementia Mother   . Heart attack Mother   . Glaucoma Mother   . Heart disease Mother        MI, stent in 2004  . Emphysema Father        smoker  . Hypertension Father   . Glaucoma Father   . COPD Father   . Other Paternal Grandfather        enlarged heart  . Ulcers Maternal Grandmother     Social History   Socioeconomic History  . Marital status: Divorced    Spouse name: Not on file  . Number of children: Not on file  . Years of education: Not on file  . Highest education level: Not on file  Occupational History  . Not on file  Social Needs  . Financial resource strain: Not on file  . Food insecurity:    Worry: Not on file    Inability: Not on file  . Transportation needs:    Medical: Not on file    Non-medical: Not on file  Tobacco Use  . Smoking status: Never Smoker  . Smokeless tobacco: Never Used  Substance and Sexual Activity  . Alcohol use: Yes    Comment: occasional wine  . Drug use: No  . Sexual activity: Never    Partners: Female    Comment: lives with partner, no major  dietary restrictions.   Lifestyle  . Physical activity:    Days per week: Not on file    Minutes per session: Not on file  . Stress: Not on file  Relationships  . Social connections:    Talks on phone: Not on file    Gets together: Not on file    Attends religious service: Not on file    Active member of club or organization: Not on file    Attends meetings of clubs or organizations: Not on file    Relationship status: Not on file  . Intimate partner violence:    Fear of current or ex partner: Not on file    Emotionally abused: Not on file    Physically abused: Not on file    Forced sexual activity: Not on file  Other Topics Concern  . Not on file  Social History Narrative  . Not on file    Outpatient Medications Prior to Visit  Medication Sig Dispense Refill  . ARMOUR THYROID 15 MG tablet Take 2 tablets on Tuesday Thursday Saturday then  take 1 tablet other days 300 tablet 1  . Riboflavin (B-2 PO) Take by mouth.    . Vitamin D, Ergocalciferol, (DRISDOL) 50000 units CAPS capsule Take 1 capsule (50,000 Units total) by mouth every 7 (seven) days. 4 capsule 4   No facility-administered medications prior to visit.     Allergies  Allergen Reactions  . Bee Venom Anaphylaxis  . Penicillins Hives    Has patient had a PCN reaction causing immediate rash, facial/tongue/throat swelling, SOB or lightheadedness with hypotension: No Has patient had a PCN reaction causing severe rash involving mucus membranes or skin necrosis: No Has patient had a PCN reaction that required hospitalization No Has patient had a PCN reaction occurring within the last 10 years: No If all of the above answers are "NO", then may proceed with Cephalosporin use.    Review of Systems  Constitutional: Negative for fever and malaise/fatigue.  HENT: Negative for congestion.   Eyes: Negative for blurred vision.  Respiratory: Negative for shortness of breath.   Cardiovascular: Negative for chest pain, palpitations and leg swelling.  Gastrointestinal: Negative for abdominal pain, blood in stool and nausea.  Genitourinary: Negative for dysuria and frequency.  Musculoskeletal: Negative for falls.  Skin: Negative for rash.  Neurological: Negative for dizziness, loss of consciousness and headaches.  Endo/Heme/Allergies: Negative for environmental allergies.  Psychiatric/Behavioral: Negative for depression. The patient is not nervous/anxious.        Objective:    Physical Exam  Constitutional: She is oriented to person, place, and time. No distress.  HENT:  Head: Normocephalic and atraumatic.  Eyes: Conjunctivae are normal.  Neck: Neck supple. No thyromegaly present.  Cardiovascular: Normal rate, regular rhythm and normal heart sounds.  No murmur heard. Pulmonary/Chest: Effort normal and breath sounds normal. She has no wheezes.  Abdominal: She exhibits no  distension and no mass.  Musculoskeletal: She exhibits no edema.  Lymphadenopathy:    She has no cervical adenopathy.  Neurological: She is alert and oriented to person, place, and time.  Skin: Skin is warm and dry. No rash noted. She is not diaphoretic.  Psychiatric: Judgment normal.    BP (!) 102/58 (BP Location: Left Arm, Patient Position: Sitting, Cuff Size: Normal)   Pulse (!) 50   Temp 97.8 F (36.6 C) (Oral)   Resp 18   Wt 136 lb 9.6 oz (62 kg)   SpO2 97%   BMI 23.45 kg/m  Wt Readings from Last 3 Encounters:  04/20/18 136 lb 9.6 oz (62 kg)  04/09/18 138 lb 9.6 oz (62.9 kg)  10/16/17 140 lb 3.2 oz (63.6 kg)   BP Readings from Last 3 Encounters:  04/20/18 (!) 102/58  04/09/18 (!) 104/58  10/16/17 125/61      There is no immunization history on file for this patient.  Health Maintenance  Topic Date Due  . TETANUS/TDAP  06/17/1962  . PNA vac Low Risk Adult (1 of 2 - PCV13) 06/17/2008  . INFLUENZA VACCINE  07/02/2018  . MAMMOGRAM  04/13/2020  . COLONOSCOPY  12/05/2020  . DEXA SCAN  Completed    Lab Results  Component Value Date   WBC 5.7 10/16/2017   HGB 12.4 10/16/2017   HCT 38.2 10/16/2017   PLT 238.0 10/16/2017   GLUCOSE 99 02/24/2018   CHOL 182 04/07/2017   TRIG 111.0 04/07/2017   HDL 82.10 04/07/2017   LDLCALC 78 04/07/2017   ALT 17 02/24/2018   AST 26 02/24/2018   NA 139 02/24/2018   K 4.8 02/24/2018   CL 104 02/24/2018   CREATININE 0.91 02/24/2018   BUN 20 02/24/2018   CO2 30 02/24/2018   TSH 10.48 (H) 02/24/2018   HGBA1C 5.7 02/24/2018    Lab Results  Component Value Date   TSH 10.48 (H) 02/24/2018   Lab Results  Component Value Date   WBC 5.7 10/16/2017   HGB 12.4 10/16/2017   HCT 38.2 10/16/2017   MCV 95.0 10/16/2017   PLT 238.0 10/16/2017   Lab Results  Component Value Date   NA 139 02/24/2018   K 4.8 02/24/2018   CO2 30 02/24/2018   GLUCOSE 99 02/24/2018   BUN 20 02/24/2018   CREATININE 0.91 02/24/2018   BILITOT 0.6  02/24/2018   ALKPHOS 64 02/24/2018   AST 26 02/24/2018   ALT 17 02/24/2018   PROT 6.8 02/24/2018   ALBUMIN 3.9 02/24/2018   CALCIUM 9.4 02/24/2018   GFR 64.11 02/24/2018   Lab Results  Component Value Date   CHOL 182 04/07/2017   Lab Results  Component Value Date   HDL 82.10 04/07/2017   Lab Results  Component Value Date   LDLCALC 78 04/07/2017   Lab Results  Component Value Date   TRIG 111.0 04/07/2017   Lab Results  Component Value Date   CHOLHDL 2 04/07/2017   Lab Results  Component Value Date   HGBA1C 5.7 02/24/2018         Assessment & Plan:   Problem List Items Addressed This Visit    Thyroid disease    Has done well with the med adjustment after elevated TSH in March will recheck next month       Relevant Orders   TSH   T4, free   T3, free   Hyperlipidemia, mild    Encouraged heart healthy diet, increase exercise, avoid trans fats, consider a krill oil cap daily      Preventative health care    Patient encouraged to maintain heart healthy diet, regular exercise, adequate sleep. Consider daily probiotics.       Vitamin D deficiency    Take daily supplements      Hyperglycemia    hgba1c acceptable, minimize simple carbs. Increase exercise as tolerated.      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  No orders of the defined types were placed in this encounter.   CMA served as Education administrator during this visit. History, Physical and Plan performed by medical provider. Documentation and orders reviewed and attested to.  Penni Homans, MD

## 2018-04-20 NOTE — Assessment & Plan Note (Signed)
Has done well with the med adjustment after elevated TSH in March will recheck next month

## 2018-04-20 NOTE — Assessment & Plan Note (Signed)
Take daily supplements 

## 2018-04-20 NOTE — Assessment & Plan Note (Signed)
Encouraged heart healthy diet, increase exercise, avoid trans fats, consider a krill oil cap daily 

## 2018-04-20 NOTE — Assessment & Plan Note (Signed)
Patient encouraged to maintain heart healthy diet, regular exercise, adequate sleep. Consider daily probiotics 

## 2018-04-20 NOTE — Assessment & Plan Note (Addendum)
hgba1c acceptable, minimize simple carbs. Increase exercise as tolerated.  

## 2018-04-20 NOTE — Patient Instructions (Signed)
Preventive Care 75 Years and Older, Female Preventive care refers to lifestyle choices and visits with your health care provider that can promote health and wellness. What does preventive care include?  A yearly physical exam. This is also called an annual well check.  Dental exams once or twice a year.  Routine eye exams. Ask your health care provider how often you should have your eyes checked.  Personal lifestyle choices, including: ? Daily care of your teeth and gums. ? Regular physical activity. ? Eating a healthy diet. ? Avoiding tobacco and drug use. ? Limiting alcohol use. ? Practicing safe sex. ? Taking low-dose aspirin every day. ? Taking vitamin and mineral supplements as recommended by your health care provider. What happens during an annual well check? The services and screenings done by your health care provider during your annual well check will depend on your age, overall health, lifestyle risk factors, and family history of disease. Counseling Your health care provider may ask you questions about your:  Alcohol use.  Tobacco use.  Drug use.  Emotional well-being.  Home and relationship well-being.  Sexual activity.  Eating habits.  History of falls.  Memory and ability to understand (cognition).  Work and work environment.  Reproductive health.  Screening You may have the following tests or measurements:  Height, weight, and BMI.  Blood pressure.  Lipid and cholesterol levels. These may be checked every 5 years, or more frequently if you are over 50 years old.  Skin check.  Lung cancer screening. You may have this screening every year starting at age 55 if you have a 30-pack-year history of smoking and currently smoke or have quit within the past 15 years.  Fecal occult blood test (FOBT) of the stool. You may have this test every year starting at age 50.  Flexible sigmoidoscopy or colonoscopy. You may have a sigmoidoscopy every 5 years or  a colonoscopy every 10 years starting at age 50.  Hepatitis C blood test.  Hepatitis B blood test.  Sexually transmitted disease (STD) testing.  Diabetes screening. This is done by checking your blood sugar (glucose) after you have not eaten for a while (fasting). You may have this done every 1-3 years.  Bone density scan. This is done to screen for osteoporosis. You may have this done starting at age 65.  Mammogram. This may be done every 1-2 years. Talk to your health care provider about how often you should have regular mammograms.  Talk with your health care provider about your test results, treatment options, and if necessary, the need for more tests. Vaccines Your health care provider may recommend certain vaccines, such as:  Influenza vaccine. This is recommended every year.  Tetanus, diphtheria, and acellular pertussis (Tdap, Td) vaccine. You may need a Td booster every 10 years.  Varicella vaccine. You may need this if you have not been vaccinated.  Zoster vaccine. You may need this after age 60.  Measles, mumps, and rubella (MMR) vaccine. You may need at least one dose of MMR if you were born in 1957 or later. You may also need a second dose.  Pneumococcal 13-valent conjugate (PCV13) vaccine. One dose is recommended after age 65.  Pneumococcal polysaccharide (PPSV23) vaccine. One dose is recommended after age 65.  Meningococcal vaccine. You may need this if you have certain conditions.  Hepatitis A vaccine. You may need this if you have certain conditions or if you travel or work in places where you may be exposed to hepatitis   A.  Hepatitis B vaccine. You may need this if you have certain conditions or if you travel or work in places where you may be exposed to hepatitis B.  Haemophilus influenzae type b (Hib) vaccine. You may need this if you have certain conditions.  Talk to your health care provider about which screenings and vaccines you need and how often you  need them. This information is not intended to replace advice given to you by your health care provider. Make sure you discuss any questions you have with your health care provider. Document Released: 12/15/2015 Document Revised: 08/07/2016 Document Reviewed: 09/19/2015 Elsevier Interactive Patient Education  2018 [employer]vier Inc.  

## 2018-04-20 NOTE — Assessment & Plan Note (Signed)
Is using Lysine and has had a flare, has used Valtrex in the past

## 2018-05-07 ENCOUNTER — Encounter: Payer: Self-pay | Admitting: Family Medicine

## 2018-05-28 ENCOUNTER — Other Ambulatory Visit (INDEPENDENT_AMBULATORY_CARE_PROVIDER_SITE_OTHER): Payer: Medicare Other

## 2018-05-28 DIAGNOSIS — E079 Disorder of thyroid, unspecified: Secondary | ICD-10-CM | POA: Diagnosis not present

## 2018-05-28 LAB — T4, FREE: Free T4: 0.82 ng/dL (ref 0.60–1.60)

## 2018-05-28 LAB — TSH: TSH: 5.38 u[IU]/mL — ABNORMAL HIGH (ref 0.35–4.50)

## 2018-05-28 LAB — T3, FREE: T3, Free: 2.7 pg/mL (ref 2.3–4.2)

## 2018-06-01 ENCOUNTER — Encounter: Payer: Self-pay | Admitting: Family Medicine

## 2018-06-01 ENCOUNTER — Other Ambulatory Visit: Payer: Self-pay | Admitting: Family Medicine

## 2018-06-02 ENCOUNTER — Other Ambulatory Visit: Payer: Self-pay

## 2018-06-12 ENCOUNTER — Other Ambulatory Visit: Payer: Self-pay

## 2018-06-12 MED ORDER — VITAMIN D (ERGOCALCIFEROL) 1.25 MG (50000 UNIT) PO CAPS: 50000.0000 [IU] | ORAL_CAPSULE | ORAL | 4 refills | Status: DC

## 2018-06-12 MED ORDER — ARMOUR THYROID 15 MG PO TABS: ORAL_TABLET | ORAL | 1 refills | Status: DC

## 2018-08-11 ENCOUNTER — Telehealth: Payer: Self-pay | Admitting: *Deleted

## 2018-08-11 NOTE — Telephone Encounter (Signed)
Received Medical Clearance Form for wellness participation from [facility] from provider; faxed to requested number/SLS 09/10

## 2018-08-26 ENCOUNTER — Other Ambulatory Visit: Payer: Self-pay | Admitting: Family Medicine

## 2018-08-26 MED ORDER — ARMOUR THYROID 15 MG PO TABS: ORAL_TABLET | ORAL | 1 refills | Status: DC

## 2018-08-26 NOTE — Telephone Encounter (Signed)
2 different sigs on med list. Please advise.

## 2018-10-22 ENCOUNTER — Ambulatory Visit: Payer: Medicare Other | Admitting: Family Medicine

## 2018-10-22 ENCOUNTER — Encounter: Payer: Self-pay | Admitting: Family Medicine

## 2018-10-22 VITALS — BP 102/56 | HR 59 | Temp 98.2°F | Resp 18 | Wt 141.8 lb

## 2018-10-22 DIAGNOSIS — M8580 Other specified disorders of bone density and structure, unspecified site: Secondary | ICD-10-CM | POA: Diagnosis not present

## 2018-10-22 DIAGNOSIS — R739 Hyperglycemia, unspecified: Secondary | ICD-10-CM

## 2018-10-22 DIAGNOSIS — E785 Hyperlipidemia, unspecified: Secondary | ICD-10-CM

## 2018-10-22 DIAGNOSIS — E559 Vitamin D deficiency, unspecified: Secondary | ICD-10-CM | POA: Diagnosis not present

## 2018-10-22 DIAGNOSIS — E079 Disorder of thyroid, unspecified: Secondary | ICD-10-CM | POA: Diagnosis not present

## 2018-10-22 DIAGNOSIS — H9193 Unspecified hearing loss, bilateral: Secondary | ICD-10-CM | POA: Diagnosis not present

## 2018-10-22 LAB — T4, FREE: Free T4: 0.7 ng/dL (ref 0.60–1.60)

## 2018-10-22 LAB — TSH: TSH: 6.4 u[IU]/mL — ABNORMAL HIGH (ref 0.35–4.50)

## 2018-10-22 LAB — T3, FREE: T3, Free: 2.8 pg/mL (ref 2.3–4.2)

## 2018-10-22 MED ORDER — CYANOCOBALAMIN 1000 MCG/ML IJ SOLN: 1000.0000 ug | INTRAMUSCULAR | 0 refills | Status: DC

## 2018-10-22 NOTE — Assessment & Plan Note (Signed)
CBC at next visit

## 2018-10-22 NOTE — Assessment & Plan Note (Signed)
hgba1c acceptable, minimize simple carbs. Increase exercise as tolerated.  

## 2018-10-22 NOTE — Assessment & Plan Note (Addendum)
Supplement and monitor 

## 2018-10-22 NOTE — Assessment & Plan Note (Signed)
Encouraged to get adequate exercise, calcium and vitamin d intake 

## 2018-10-22 NOTE — Assessment & Plan Note (Signed)
Check labs today.

## 2018-10-22 NOTE — Assessment & Plan Note (Signed)
Encouraged heart healthy diet, increase exercise, avoid trans fats, consider a krill oil cap daily 

## 2018-10-23 ENCOUNTER — Telehealth: Payer: Self-pay

## 2018-10-23 DIAGNOSIS — H9193 Unspecified hearing loss, bilateral: Secondary | ICD-10-CM

## 2018-10-23 NOTE — Telephone Encounter (Signed)
Copied from [place] [id number]. Topic: Referral - Status >> Oct 23, 2018  3:09 PM Scherrie Gerlach wrote: Reason for CRM: pt following up on [facility] hearing clinic referral.  They called the pt because they hadnot heard anything from the office. Pt has appt Tues, 11/26 and needs sent asap    [facility] please advise on referral

## 2018-10-25 NOTE — Progress Notes (Signed)
Subjective:    Patient ID: Angela Buck, female    DOB: Feb 02, 1941, 77 y.o.   MRN: [id number]  No chief complaint on file.   HPI Patient is in today for follow up. She is noting her elderly aunt just died but despite her grief she is managing well. No recent febrile illness or hospitalizations. Denies CP/palp/SOB/HA/congestion/fevers/GI or GU c/o. Taking meds as prescribed  Past Medical History:  Diagnosis Date  . Abdominal pain 03/16/2012  . 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 Chicken pox as a child  . Colonic polyp 01/05/2012  . Elevated BP 01/05/2012  . Fatigue 03/16/2012  . History of mumps   . Leukopenia 05/18/2014  . Mass of axilla 01/05/2012  . Measles as a child  . Medicare annual wellness visit, subsequent 01/05/2012   Sees Dr Collene Mares of Gastroenterology   . Neutropenia ([place]) 10/07/2016  . Osteopenia 01/05/2012  . Other and unspecified hyperlipidemia 05/18/2014  . Pain of left heel 04/07/2017  . Preventative health care 01/05/2012  . Preventative health care 10/07/2016  . Skin cancer 12/02/2009  . Thyroid disease 03/16/2012   right   . Vitamin D deficiency 04/07/2017    Past Surgical History:  Procedure Laterality Date  . biopsy of jawline  2011  . CATARACT EXTRACTION, BILATERAL     b/l  . laproscopy  1970   abdominal, endometriosis, with D&C  . right hip replacement  2003  . TONSILLECTOMY AND ADENOIDECTOMY  1949    Family History  Problem Relation Age of Onset  . Arthritis Mother   . Hypertension Mother   . Dementia Mother   . Heart attack Mother   . Glaucoma Mother   . Heart disease Mother        MI, stent in 2004  . Emphysema Father        smoker  . Hypertension Father   . Glaucoma Father   . COPD Father   . Other Paternal Grandfather        enlarged heart  . Ulcers Maternal Grandmother     Social History   Socioeconomic History  . Marital status: Divorced    Spouse name: Not on file  . Number of children: Not on file  . Years of  education: Not on file  . Highest education level: Not on file  Occupational History  . Not on file  Social Needs  . Financial resource strain: Not on file  . Food insecurity:    Worry: Not on file    Inability: Not on file  . Transportation needs:    Medical: Not on file    Non-medical: Not on file  Tobacco Use  . Smoking status: Never Smoker  . Smokeless tobacco: Never Used  Substance and Sexual Activity  . Alcohol use: Yes    Comment: occasional wine  . Drug use: No  . Sexual activity: Never    Partners: Female    Comment: lives with partner, no major dietary restrictions.   Lifestyle  . Physical activity:    Days per week: Not on file    Minutes per session: Not on file  . Stress: Not on file  Relationships  . Social connections:    Talks on phone: Not on file    Gets together: Not on file    Attends religious service: Not on file    Active member of club or organization: Not on file    Attends meetings of clubs or  organizations: Not on file    Relationship status: Not on file  . Intimate partner violence:    Fear of current or ex partner: Not on file    Emotionally abused: Not on file    Physically abused: Not on file    Forced sexual activity: Not on file  Other Topics Concern  . Not on file  Social History Narrative  . Not on file    Outpatient Medications Prior to Visit  Medication Sig Dispense Refill  . ARMOUR THYROID 15 MG tablet Take 2 tablets on Tuesday Thursday Saturday then take 1 tablet other days 300 tablet 1  . Riboflavin (B-2 PO) Take by mouth.    . Vitamin D, Ergocalciferol, (DRISDOL) 50000 units CAPS capsule Take 1 capsule (50,000 Units total) by mouth every 7 (seven) days. 4 capsule 4   No facility-administered medications prior to visit.     Allergies  Allergen Reactions  . Bee Venom Anaphylaxis  . Penicillins Hives    Has patient had a PCN reaction causing immediate rash, facial/tongue/throat swelling, SOB or lightheadedness with  hypotension: No Has patient had a PCN reaction causing severe rash involving mucus membranes or skin necrosis: No Has patient had a PCN reaction that required hospitalization No Has patient had a PCN reaction occurring within the last 10 years: No If all of the above answers are "NO", then may proceed with Cephalosporin use.    Review of Systems  Constitutional: Negative for fever and malaise/fatigue.  HENT: Positive for hearing loss. Negative for congestion.   Eyes: Negative for blurred vision.  Respiratory: Negative for shortness of breath.   Cardiovascular: Negative for chest pain, palpitations and leg swelling.  Gastrointestinal: Negative for abdominal pain, blood in stool and nausea.  Genitourinary: Negative for dysuria and frequency.  Musculoskeletal: Negative for falls.  Skin: Negative for rash.  Neurological: Negative for dizziness, loss of consciousness and headaches.  Endo/Heme/Allergies: Negative for environmental allergies.  Psychiatric/Behavioral: Negative for depression. The patient is not nervous/anxious.        Objective:    Physical Exam  Constitutional: She is oriented to person, place, and time. No distress.  HENT:  Head: Normocephalic and atraumatic.  Right Ear: External ear normal.  Left Ear: External ear normal.  Nose: Nose normal.  Mouth/Throat: Oropharynx is clear and moist. No oropharyngeal exudate.  Eyes: Pupils are equal, round, and reactive to light. Conjunctivae are normal. Right eye exhibits no discharge. Left eye exhibits no discharge. No scleral icterus.  Neck: Normal range of motion. Neck supple. No thyromegaly present.  Cardiovascular: Normal rate, regular rhythm, normal heart sounds and intact distal pulses.  No murmur heard. Pulmonary/Chest: Effort normal and breath sounds normal. No respiratory distress. She has no wheezes. She has no rales.  Abdominal: Soft. Bowel sounds are normal. She exhibits no distension and no mass. There is no  tenderness.  Musculoskeletal: Normal range of motion. She exhibits no edema or tenderness.  Lymphadenopathy:    She has no cervical adenopathy.  Neurological: She is alert and oriented to person, place, and time. She has normal reflexes. She displays normal reflexes. No cranial nerve deficit. Coordination normal.  Skin: Skin is warm and dry. No rash noted. She is not diaphoretic.    BP (!) 102/56 (BP Location: Left Arm, Patient Position: Sitting, Cuff Size: Normal)   Pulse (!) 59   Temp 98.2 F (36.8 C) (Oral)   Resp 18   Wt 141 lb 12.8 oz (64.3 kg)   SpO2 98%  BMI 24.34 kg/m  Wt Readings from Last 3 Encounters:  10/22/18 141 lb 12.8 oz (64.3 kg)  04/20/18 136 lb 9.6 oz (62 kg)  04/09/18 138 lb 9.6 oz (62.9 kg)     Lab Results  Component Value Date   WBC 5.7 10/16/2017   HGB 12.4 10/16/2017   HCT 38.2 10/16/2017   PLT 238.0 10/16/2017   GLUCOSE 99 02/24/2018   CHOL 182 04/07/2017   TRIG 111.0 04/07/2017   HDL 82.10 04/07/2017   LDLCALC 78 04/07/2017   ALT 17 02/24/2018   AST 26 02/24/2018   NA 139 02/24/2018   K 4.8 02/24/2018   CL 104 02/24/2018   CREATININE 0.91 02/24/2018   BUN 20 02/24/2018   CO2 30 02/24/2018   TSH 6.40 (H) 10/22/2018   HGBA1C 5.7 02/24/2018    Lab Results  Component Value Date   TSH 6.40 (H) 10/22/2018   Lab Results  Component Value Date   WBC 5.7 10/16/2017   HGB 12.4 10/16/2017   HCT 38.2 10/16/2017   MCV 95.0 10/16/2017   PLT 238.0 10/16/2017   Lab Results  Component Value Date   NA 139 02/24/2018   K 4.8 02/24/2018   CO2 30 02/24/2018   GLUCOSE 99 02/24/2018   BUN 20 02/24/2018   CREATININE 0.91 02/24/2018   BILITOT 0.6 02/24/2018   ALKPHOS 64 02/24/2018   AST 26 02/24/2018   ALT 17 02/24/2018   PROT 6.8 02/24/2018   ALBUMIN 3.9 02/24/2018   CALCIUM 9.4 02/24/2018   GFR 64.11 02/24/2018   Lab Results  Component Value Date   CHOL 182 04/07/2017   Lab Results  Component Value Date   HDL 82.10 04/07/2017    Lab Results  Component Value Date   LDLCALC 78 04/07/2017   Lab Results  Component Value Date   TRIG 111.0 04/07/2017   Lab Results  Component Value Date   CHOLHDL 2 04/07/2017   Lab Results  Component Value Date   HGBA1C 5.7 02/24/2018       Assessment & Plan:   Problem List Items Addressed This Visit    Osteopenia    Encouraged to get adequate exercise, calcium and vitamin d intake      Relevant Orders   CBC   Thyroid disease    Check labs today      Relevant Orders   TSH (Completed)   T4, free (Completed)   T3, free (Completed)   TSH   T4, free   T3, free   Hyperlipidemia, mild    Encouraged heart healthy diet, increase exercise, avoid trans fats, consider a krill oil cap daily      Relevant Orders   Lipid panel   Vitamin D deficiency    Supplement and monitor       Relevant Orders   VITAMIN D 25 Hydroxy (Vit-D Deficiency, Fractures)   Hyperglycemia    hgba1c acceptable, minimize simple carbs. Increase exercise as tolerated.       Relevant Orders   Hemoglobin A1c   Comprehensive metabolic panel    Other Visit Diagnoses    Bilateral hearing loss, unspecified hearing loss type    -  Primary   Relevant Orders   Ambulatory referral to Audiology      I have discontinued Mardene Celeste L. Kovatch's Vitamin D (Ergocalciferol). I am also having her start on cyanocobalamin. Additionally, I am having her maintain her Riboflavin (B-2 PO) and ARMOUR THYROID.  Meds ordered this encounter  Medications  .  cyanocobalamin (,VITAMIN B-12,) 1000 MCG/ML injection    Sig: Inject 1 mL (1,000 mcg total) into the muscle every 14 (fourteen) days.    Dispense:  24 mL    Refill:  0     Penni Homans, MD

## 2018-10-26 NOTE — Telephone Encounter (Signed)
Patient appointment with Angela Buck is tomorrow 10/27/2018 at 1pm and her referral needs to be "fixed" before then in order to be seen. Advised by flow that Meredith Mody is out of the office and patient should call back regarding this at 8am.

## 2018-11-03 NOTE — Addendum Note (Signed)
Addended by: Magdalene Molly A on: 11/03/2018 11:34 AM   Modules accepted: Orders

## 2018-11-03 NOTE — Addendum Note (Signed)
Addended by: Magdalene Molly A on: 11/03/2018 10:46 AM   Modules accepted: Orders

## 2018-11-03 NOTE — Addendum Note (Signed)
Addended by: Magdalene Molly A on: 11/03/2018 08:44 AM   Modules accepted: Orders

## 2018-11-10 ENCOUNTER — Encounter: Payer: Self-pay | Admitting: Family Medicine

## 2019-02-14 ENCOUNTER — Other Ambulatory Visit: Payer: Self-pay | Admitting: Family Medicine

## 2019-03-22 ENCOUNTER — Other Ambulatory Visit: Payer: Medicare Other

## 2019-03-29 ENCOUNTER — Encounter: Payer: Medicare Other | Admitting: Family Medicine

## 2019-04-09 NOTE — Progress Notes (Addendum)
Virtual Visit via Video Note  I connected with patient on 04/12/19 at  2:00 PM EDT by a video enabled telemedicine application and verified that I am speaking with the correct person using two identifiers.   THIS ENCOUNTER IS A VIRTUAL VISIT DUE TO COVID-19 - PATIENT WAS NOT SEEN IN THE OFFICE. PATIENT HAS CONSENTED TO VIRTUAL VISIT / TELEMEDICINE VISIT   Location of patient: home  Location of provider: office  I discussed the limitations of evaluation and management by telemedicine and the availability of in person appointments. The patient expressed understanding and agreed to proceed.   Subjective:   Angela Buck is a 78 y.o. female who presents for Medicare Annual (Subsequent) preventive examination.  Review of Systems: No ROS.  Medicare Wellness Virtual Visit.  Visual/audio telehealth visit, UTA vital signs.   See social history for additional risk factors. Cardiac Risk Factors include: advanced age (>57men, >6 women);dyslipidemia Sleep patterns: no issues. Home Safety/Smoke Alarms: Feels safe in home. Smoke alarms in place.  Lives with [place] in [place] in 1 story home.   Female:       Mammo-  04/13/18 . Pt declines at this time due to covid.  Dexa scan-  04/13/18      CCS- 2012. Recall 5-10 yrs per report.     Objective:     Vitals:Unable to assess. This visit is enabled though telemedicine due to Covid 19.   Advanced Directives 04/12/2019 04/09/2018 04/07/2017 04/07/2016 09/29/2015 09/15/2014  Does Patient Have a Medical Advance Directive? Yes Yes Yes Yes Yes Yes  Type of Paramedic of [place];Living will [place];Living will [place];Living will [place];Living will [place];Living will -  Does patient want to make changes to medical advance directive? No - Patient declined - - - - -  Copy of [place] in Chart? No - copy requested Yes No -  copy requested No - copy requested Yes -    Tobacco Social History   Tobacco Use  Smoking Status Never Smoker  Smokeless Tobacco Never Used     Counseling given: Not Answered   Clinical Intake:     Pain : No/denies pain    Past Medical History:  Diagnosis Date  . Abdominal pain 03/16/2012  . 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 Chicken pox as a child  . Colonic polyp 01/05/2012  . Elevated BP 01/05/2012  . Fatigue 03/16/2012  . History of mumps   . Leukopenia 05/18/2014  . Mass of axilla 01/05/2012  . Measles as a child  . Medicare annual wellness visit, subsequent 01/05/2012   Sees Dr Collene Mares of Gastroenterology   . Neutropenia ([place]) 10/07/2016  . Osteopenia 01/05/2012  . Other and unspecified hyperlipidemia 05/18/2014  . Pain of left heel 04/07/2017  . Preventative health care 01/05/2012  . Preventative health care 10/07/2016  . Skin cancer 12/02/2009  . Thyroid disease 03/16/2012   right   . Vitamin D deficiency 04/07/2017   Past Surgical History:  Procedure Laterality Date  . biopsy of jawline  2011  . CATARACT EXTRACTION, BILATERAL     b/l  . laproscopy  1970   abdominal, endometriosis, with D&C  . right hip replacement  2003  . TONSILLECTOMY AND ADENOIDECTOMY  1949   Family History  Problem Relation Age of Onset  . Arthritis Mother   . Hypertension Mother   . Dementia Mother   .  Heart attack Mother   . Glaucoma Mother   . Heart disease Mother        MI, stent in 2004  . Emphysema Father        smoker  . Hypertension Father   . Glaucoma Father   . COPD Father   . Other Paternal Grandfather        enlarged heart  . Ulcers Maternal Grandmother    Social History   Socioeconomic History  . Marital status: Divorced    Spouse name: Not on file  . Number of children: Not on file  . Years of education: Not on file  . Highest education level: Not on file  Occupational History  . Not on file  Social Needs  . Financial resource strain: Not on file   . Food insecurity:    Worry: Not on file    Inability: Not on file  . Transportation needs:    Medical: Not on file    Non-medical: Not on file  Tobacco Use  . Smoking status: Never Smoker  . Smokeless tobacco: Never Used  Substance and Sexual Activity  . Alcohol use: Yes    Comment: occasional wine  . Drug use: No  . Sexual activity: Never    Partners: Female    Comment: lives with partner, no major dietary restrictions.   Lifestyle  . Physical activity:    Days per week: Not on file    Minutes per session: Not on file  . Stress: Not on file  Relationships  . Social connections:    Talks on phone: Not on file    Gets together: Not on file    Attends religious service: Not on file    Active member of club or organization: Not on file    Attends meetings of clubs or organizations: Not on file    Relationship status: Not on file  Other Topics Concern  . Not on file  Social History Narrative  . Not on file    Outpatient Encounter Medications as of 04/12/2019  Medication Sig  . ARMOUR THYROID 15 MG tablet TAKE 2 TABLETS ON TUESDAY THURSDAY SATURDAY THEN TAKE 1 TABLET OTHER DAYS  . cyanocobalamin (,VITAMIN B-12,) 1000 MCG/ML injection Inject 1 mL (1,000 mcg total) into the muscle every 14 (fourteen) days.  . Riboflavin (B-2 PO) Take by mouth.   No facility-administered encounter medications on file as of 04/12/2019.     Activities of Daily Living In your present state of health, do you have any difficulty performing the following activities: 04/12/2019  Hearing? N  Comment has hearing aids. Only wears in large crowds.  Vision? N  Comment wears readers.  Difficulty concentrating or making decisions? N  Walking or climbing stairs? N  Dressing or bathing? N  Doing errands, shopping? N  Preparing Food and eating ? N  Using the Toilet? N  In the past six months, have you accidently leaked urine? N  Do you have problems with loss of bowel control? N  Managing your  Medications? N  Managing your Finances? N  Housekeeping or managing your Housekeeping? N  Some recent data might be hidden    Patient Care Team: Mosie Lukes, MD as PCP - General (Family Medicine) Mosie Lukes, MD (Family Medicine) Jari Pigg, MD as Consulting Physician (Dermatology)    Assessment:   This is a routine wellness examination for Angela Buck. Physical assessment deferred to PCP.  Exercise Activities and Dietary recommendations Current Exercise Habits: Home  exercise routine, Type of exercise: yoga;walking, Time (Minutes): 60, Frequency (Times/Week): 5, Weekly Exercise (Minutes/Week): 300, Intensity: Mild, Exercise limited by: None identified   Diet (meal preparation, eat out, water intake, caffeinated beverages, dairy products, fruits and vegetables): in general, a "healthy" diet  , well balanced, on average, 3 meals per day   Goals    . Continue being active.    . Travel back to [place]  04/12/2019 04/09/2018 04/07/2017 10/07/2016 09/29/2015  Falls in the past year? 0 No No No No     Depression Screen PHQ 2/9 Scores 04/12/2019 04/09/2018 04/07/2017 10/07/2016  PHQ - 2 Score 0 0 0 0     Cognitive Function Ad8 score reviewed for issues:  Issues making decisions:no  Less interest in hobbies / activities:no  Repeats questions, stories (family complaining):no  Trouble using ordinary gadgets (microwave, computer, phone):no  Forgets the month or year: no  Mismanaging finances: no  Remembering appts:no  Daily problems with thinking and/or memory:no Ad8 score is=0          There is no immunization history on file for this patient.  Screening Tests Health Maintenance  Topic Date Due  . TETANUS/TDAP  06/17/1962  . PNA vac Low Risk Adult (1 of 2 - PCV13) 04/11/2020 (Originally 06/17/2008)  . COLONOSCOPY  12/05/2020  . DEXA SCAN  Completed  . INFLUENZA VACCINE  Discontinued      Plan:   See you next year!  Continue to eat  heart healthy diet (full of fruits, vegetables, whole grains, lean protein, water--limit salt, fat, and sugar intake) and increase physical activity as tolerated.  Continue doing brain stimulating activities (puzzles, reading, adult coloring books, staying active) to keep memory sharp.   Let us know when you are ready to schedule mammogram.  I have personally reviewed and noted the following in the patient's chart:   . Medical and social history . Use of alcohol, tobacco or illicit drugs  . Current medications and supplements . Functional ability and status . Nutritional status . Physical activity . Advanced directives . List of other physicians . Hospitalizations, surgeries, and ER visits in previous 12 months . Vitals . Screenings to include cognitive, depression, and falls . Referrals and appointments  In addition, I have reviewed and discussed with patient certain preventive protocols, quality metrics, and best practice recommendations. A written personalized care plan for preventive services as well as general preventive health recommendations were provided to patient.     Shela Nevin, [place]  04/12/2019   Medical screening examination/treatment was performed by qualified clinical staff member and as supervising physician I was immediately available for consultation/collaboration. I have reviewed documentation and agree with assessment and plan.  Penni Homans, MD

## 2019-04-12 ENCOUNTER — Ambulatory Visit: Payer: Medicare Other | Admitting: *Deleted

## 2019-04-12 ENCOUNTER — Ambulatory Visit (INDEPENDENT_AMBULATORY_CARE_PROVIDER_SITE_OTHER): Payer: Medicare Other | Admitting: *Deleted

## 2019-04-12 ENCOUNTER — Other Ambulatory Visit: Payer: Self-pay

## 2019-04-12 ENCOUNTER — Encounter: Payer: Self-pay | Admitting: *Deleted

## 2019-04-12 DIAGNOSIS — Z0000 Encounter for general adult medical examination without abnormal findings: Secondary | ICD-10-CM

## 2019-04-12 NOTE — Patient Instructions (Signed)
See you next year!  Continue to eat heart healthy diet (full of fruits, vegetables, whole grains, lean protein, water--limit salt, fat, and sugar intake) and increase physical activity as tolerated.  Continue doing brain stimulating activities (puzzles, reading, adult coloring books, staying active) to keep memory sharp.   Let us know when you are ready to schedule mammogram.   Angela Buck , Thank you for taking time to come for your Medicare Wellness Visit. I appreciate your ongoing commitment to your health goals. Please review the following plan we discussed and let me know if I can assist you in the future.   These are the goals we discussed: Goals    . Continue being active.    . Travel back to [place]       This is a list of the screening recommended for you and due dates:  Health Maintenance  Topic Date Due  . Tetanus Vaccine  06/17/1962  . Pneumonia vaccines (1 of 2 - PCV13) 04/11/2020*  . Colon Cancer Screening  12/05/2020  . DEXA scan (bone density measurement)  Completed  . Flu Shot  Discontinued  *Topic was postponed. The date shown is not the original due date.    Health Maintenance After Age 93 After age 77, you are at a higher risk for certain long-term diseases and infections as well as injuries from falls. Falls are a major cause of broken bones and head injuries in people who are older than age 31. Getting regular preventive care can help to keep you healthy and well. Preventive care includes getting regular testing and making lifestyle changes as recommended by your health care provider. Talk with your health care provider about:  Which screenings and tests you should have. A screening is a test that checks for a disease when you have no symptoms.  A diet and exercise plan that is right for you. What should I know about screenings and tests to prevent falls? Screening and testing are the best ways to find a health problem early. Early diagnosis and treatment give  you the best chance of managing medical conditions that are common after age 73. Certain conditions and lifestyle choices may make you more likely to have a fall. Your health care provider may recommend:  Regular vision checks. Poor vision and conditions such as cataracts can make you more likely to have a fall. If you wear glasses, make sure to get your prescription updated if your vision changes.  Medicine review. Work with your health care provider to regularly review all of the medicines you are taking, including over-the-counter medicines. Ask your health care provider about any side effects that may make you more likely to have a fall. Tell your health care provider if any medicines that you take make you feel dizzy or sleepy.  Osteoporosis screening. Osteoporosis is a condition that causes the bones to get weaker. This can make the bones weak and cause them to break more easily.  Blood pressure screening. Blood pressure changes and medicines to control blood pressure can make you feel dizzy.  Strength and balance checks. Your health care provider may recommend certain tests to check your strength and balance while standing, walking, or changing positions.  Foot health exam. Foot pain and numbness, as well as not wearing proper footwear, can make you more likely to have a fall.  Depression screening. You may be more likely to have a fall if you have a fear of falling, feel emotionally low, or feel unable  to do activities that you used to do.  Alcohol use screening. Using too much alcohol can affect your balance and may make you more likely to have a fall. What actions can I take to lower my risk of falls? General instructions  Talk with your health care provider about your risks for falling. Tell your health care provider if: ? You fall. Be sure to tell your health care provider about all falls, even ones that seem minor. ? You feel dizzy, sleepy, or off-balance.  Take over-the-counter  and prescription medicines only as told by your health care provider. These include any supplements.  Eat a healthy diet and maintain a healthy weight. A healthy diet includes low-fat dairy products, low-fat (lean) meats, and fiber from whole grains, beans, and lots of fruits and vegetables. Home safety  Remove any tripping hazards, such as rugs, cords, and clutter.  Install safety equipment such as grab bars in bathrooms and safety rails on stairs.  Keep rooms and walkways well-lit. Activity   Follow a regular exercise program to stay fit. This will help you maintain your balance. Ask your health care provider what types of exercise are appropriate for you.  If you need a cane or walker, use it as recommended by your health care provider.  Wear supportive shoes that have nonskid soles. Lifestyle  Do not drink alcohol if your health care provider tells you not to drink.  If you drink alcohol, limit how much you have: ? 0-1 drink a day for women. ? 0-2 drinks a day for men.  Be aware of how much alcohol is in your drink. In the [place] one drink equals one typical bottle of beer (12 oz), one-half glass of wine (5 oz), or one shot of hard liquor (1 oz).  Do not use any products that contain nicotine or tobacco, such as cigarettes and e-cigarettes. If you need help quitting, ask your health care provider. Summary  Having a healthy lifestyle and getting preventive care can help to protect your health and wellness after age 56.  Screening and testing are the best way to find a health problem early and help you avoid having a fall. Early diagnosis and treatment give you the best chance for managing medical conditions that are more common for people who are older than age 58.  Falls are a major cause of broken bones and head injuries in people who are older than age 6. Take precautions to prevent a fall at home.  Work with your health care provider to learn what changes you can make to  improve your health and wellness and to prevent falls. This information is not intended to replace advice given to you by your health care provider. Make sure you discuss any questions you have with your health care provider. Document Released: 10/01/2017 Document Revised: 10/01/2017 Document Reviewed: 10/01/2017 Elsevier Interactive Patient Education  2019 [employer].

## 2019-04-14 NOTE — Progress Notes (Signed)
Medical screening examination/treatment/procedure(s) were performed by the Wellness Coach, RN. As primary care provider I was immediately available for consulation/collaboration. I agree with above documentation. Charlotte Nche, AGNP-C 

## 2019-04-23 ENCOUNTER — Other Ambulatory Visit: Payer: Medicare Other

## 2019-04-27 ENCOUNTER — Other Ambulatory Visit: Payer: Self-pay

## 2019-04-27 ENCOUNTER — Ambulatory Visit (INDEPENDENT_AMBULATORY_CARE_PROVIDER_SITE_OTHER): Payer: Medicare Other | Admitting: Family Medicine

## 2019-04-27 ENCOUNTER — Telehealth: Payer: Self-pay | Admitting: Family Medicine

## 2019-04-27 DIAGNOSIS — R739 Hyperglycemia, unspecified: Secondary | ICD-10-CM

## 2019-04-27 DIAGNOSIS — E785 Hyperlipidemia, unspecified: Secondary | ICD-10-CM

## 2019-04-27 DIAGNOSIS — D709 Neutropenia, unspecified: Secondary | ICD-10-CM | POA: Diagnosis not present

## 2019-04-27 DIAGNOSIS — E559 Vitamin D deficiency, unspecified: Secondary | ICD-10-CM

## 2019-04-27 DIAGNOSIS — E079 Disorder of thyroid, unspecified: Secondary | ICD-10-CM

## 2019-04-27 NOTE — Assessment & Plan Note (Signed)
Encouraged heart healthy diet, increase exercise, avoid trans fats, consider a krill oil cap daily 

## 2019-04-27 NOTE — Assessment & Plan Note (Signed)
Doing well on Armour Thyroid was unable to see her naturopath in [place] in April due to quarantine. She is going to check with her home facility of [facility] to see if they are willing to let us run her labwork there. If they cannot we will order here.

## 2019-04-27 NOTE — Assessment & Plan Note (Signed)
hgba1c acceptable, minimize simple carbs. Increase exercise as tolerated.  

## 2019-04-27 NOTE — Progress Notes (Signed)
Virtual Visit via Video Note  I connected with Angela Buck on 04/27/19 at  8:40 AM EDT by a video enabled telemedicine application and verified that I am speaking with the correct person using two identifiers.  Location: Patient: home Provider: home   I discussed the limitations of evaluation and management by telemedicine and the availability of in person appointments. The patient expressed understanding and agreed to proceed.    Subjective:    Patient ID: Angela Buck, female    DOB: 09-26-1941, 78 y.o.   MRN: [id number]  No chief complaint on file.   HPI Patient is in today for follow up on chronic medical concerns including thyroid disease, hyperlipidemia and hyperglycemia and more. She feels well and is following quarantine strictly. Only going to the grocery store infrequently. Denies CP/palp/SOB/HA/congestion/fevers/GI or GU c/o. Taking meds as prescribed  Past Medical History:  Diagnosis Date  . Abdominal pain 03/16/2012  . 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 Chicken pox as a child  . Colonic polyp 01/05/2012  . Elevated BP 01/05/2012  . Fatigue 03/16/2012  . History of mumps   . Leukopenia 05/18/2014  . Mass of axilla 01/05/2012  . Measles as a child  . Medicare annual wellness visit, subsequent 01/05/2012   Sees Dr Collene Mares of Gastroenterology   . Neutropenia ([place]) 10/07/2016  . Osteopenia 01/05/2012  . Other and unspecified hyperlipidemia 05/18/2014  . Pain of left heel 04/07/2017  . Preventative health care 01/05/2012  . Preventative health care 10/07/2016  . Skin cancer 12/02/2009  . Thyroid disease 03/16/2012   right   . Vitamin D deficiency 04/07/2017    Past Surgical History:  Procedure Laterality Date  . biopsy of jawline  2011  . CATARACT EXTRACTION, BILATERAL     b/l  . laproscopy  1970   abdominal, endometriosis, with D&C  . right hip replacement  2003  . TONSILLECTOMY AND ADENOIDECTOMY  1949    Family History  Problem Relation Age of Onset   . Arthritis Mother   . Hypertension Mother   . Dementia Mother   . Heart attack Mother   . Glaucoma Mother   . Heart disease Mother        MI, stent in 2004  . Emphysema Father        smoker  . Hypertension Father   . Glaucoma Father   . COPD Father   . Other Paternal Grandfather        enlarged heart  . Ulcers Maternal Grandmother     Social History   Socioeconomic History  . Marital status: Divorced    Spouse name: Not on file  . Number of children: Not on file  . Years of education: Not on file  . Highest education level: Not on file  Occupational History  . Not on file  Social Needs  . Financial resource strain: Not on file  . Food insecurity:    Worry: Not on file    Inability: Not on file  . Transportation needs:    Medical: Not on file    Non-medical: Not on file  Tobacco Use  . Smoking status: Never Smoker  . Smokeless tobacco: Never Used  Substance and Sexual Activity  . Alcohol use: Yes    Comment: occasional wine  . Drug use: No  . Sexual activity: Never    Partners: Female    Comment: lives with partner, no major dietary restrictions.   Lifestyle  .  Physical activity:    Days per week: Not on file    Minutes per session: Not on file  . Stress: Not on file  Relationships  . Social connections:    Talks on phone: Not on file    Gets together: Not on file    Attends religious service: Not on file    Active member of club or organization: Not on file    Attends meetings of clubs or organizations: Not on file    Relationship status: Not on file  . Intimate partner violence:    Fear of current or ex partner: Not on file    Emotionally abused: Not on file    Physically abused: Not on file    Forced sexual activity: Not on file  Other Topics Concern  . Not on file  Social History Narrative  . Not on file    Outpatient Medications Prior to Visit  Medication Sig Dispense Refill  . ARMOUR THYROID 15 MG tablet TAKE 2 TABLETS ON TUESDAY THURSDAY  SATURDAY THEN TAKE 1 TABLET OTHER DAYS 128 tablet 1  . cyanocobalamin (,VITAMIN B-12,) 1000 MCG/ML injection Inject 1 mL (1,000 mcg total) into the muscle every 14 (fourteen) days. 24 mL 0  . Riboflavin (B-2 PO) Take by mouth.     No facility-administered medications prior to visit.     Allergies  Allergen Reactions  . Bee Venom Anaphylaxis  . Penicillins Hives    Has patient had a PCN reaction causing immediate rash, facial/tongue/throat swelling, SOB or lightheadedness with hypotension: No Has patient had a PCN reaction causing severe rash involving mucus membranes or skin necrosis: No Has patient had a PCN reaction that required hospitalization No Has patient had a PCN reaction occurring within the last 10 years: No If all of the above answers are "NO", then may proceed with Cephalosporin use.    Review of Systems  Constitutional: Negative for fever and malaise/fatigue.  HENT: Negative for congestion.   Eyes: Negative for blurred vision.  Respiratory: Negative for shortness of breath.   Cardiovascular: Negative for chest pain, palpitations and leg swelling.  Gastrointestinal: Negative for abdominal pain, blood in stool and nausea.  Genitourinary: Negative for dysuria and frequency.  Musculoskeletal: Negative for falls.  Skin: Negative for rash.  Neurological: Negative for dizziness, loss of consciousness and headaches.  Endo/Heme/Allergies: Negative for environmental allergies.  Psychiatric/Behavioral: Negative for depression. The patient is not nervous/anxious.        Objective:    Physical Exam Constitutional:      Appearance: Normal appearance. She is not ill-appearing.  HENT:     Head: Normocephalic and atraumatic.     Nose: Nose normal.  Pulmonary:     Effort: Pulmonary effort is normal.  Neurological:     Mental Status: She is alert and oriented to person, place, and time.  Psychiatric:        Mood and Affect: Mood normal.        Behavior: Behavior normal.      Temp 98.6 F (37 C) (Oral)   Wt 139 lb (63 kg)   BMI 23.86 kg/m  Wt Readings from Last 3 Encounters:  04/27/19 139 lb (63 kg)  10/22/18 141 lb 12.8 oz (64.3 kg)  04/20/18 136 lb 9.6 oz (62 kg)    Diabetic Foot Exam - Simple   No data filed     Lab Results  Component Value Date   WBC 5.7 10/16/2017   HGB 12.4 10/16/2017   HCT  38.2 10/16/2017   PLT 238.0 10/16/2017   GLUCOSE 99 02/24/2018   CHOL 182 04/07/2017   TRIG 111.0 04/07/2017   HDL 82.10 04/07/2017   LDLCALC 78 04/07/2017   ALT 17 02/24/2018   AST 26 02/24/2018   NA 139 02/24/2018   K 4.8 02/24/2018   CL 104 02/24/2018   CREATININE 0.91 02/24/2018   BUN 20 02/24/2018   CO2 30 02/24/2018   TSH 6.40 (H) 10/22/2018   HGBA1C 5.7 02/24/2018    Lab Results  Component Value Date   TSH 6.40 (H) 10/22/2018   Lab Results  Component Value Date   WBC 5.7 10/16/2017   HGB 12.4 10/16/2017   HCT 38.2 10/16/2017   MCV 95.0 10/16/2017   PLT 238.0 10/16/2017   Lab Results  Component Value Date   NA 139 02/24/2018   K 4.8 02/24/2018   CO2 30 02/24/2018   GLUCOSE 99 02/24/2018   BUN 20 02/24/2018   CREATININE 0.91 02/24/2018   BILITOT 0.6 02/24/2018   ALKPHOS 64 02/24/2018   AST 26 02/24/2018   ALT 17 02/24/2018   PROT 6.8 02/24/2018   ALBUMIN 3.9 02/24/2018   CALCIUM 9.4 02/24/2018   GFR 64.11 02/24/2018   Lab Results  Component Value Date   CHOL 182 04/07/2017   Lab Results  Component Value Date   HDL 82.10 04/07/2017   Lab Results  Component Value Date   LDLCALC 78 04/07/2017   Lab Results  Component Value Date   TRIG 111.0 04/07/2017   Lab Results  Component Value Date   CHOLHDL 2 04/07/2017   Lab Results  Component Value Date   HGBA1C 5.7 02/24/2018       Assessment & Plan:   Problem List Items Addressed This Visit    Thyroid disease    Doing well on Armour Thyroid was unable to see her naturopath in [place] in April due to quarantine. She is going to check with her home  facility of [facility] to see if they are willing to let us run her labwork there. If they cannot we will order here.       Hyperlipidemia, mild    Encouraged heart healthy diet, increase exercise, avoid trans fats, consider a krill oil cap daily      Neutropenia (HCC)    Mild, asymptomatic      Vitamin D deficiency    Supplement and montor      Hyperglycemia    hgba1c acceptable, minimize simple carbs. Increase exercise as tolerated.          I am having Mardene Celeste L. [place] maintain her Riboflavin (B-2 PO), cyanocobalamin, and Armour Thyroid.  No orders of the defined types were placed in this encounter.    I discussed the assessment and treatment plan with the patient. The patient was provided an opportunity to ask questions and all were answered. The patient agreed with the plan and demonstrated an understanding of the instructions.   The patient was advised to call back or seek an in-person evaluation if the symptoms worsen or if the condition fails to improve as anticipated.  I provided 25 minutes of non-face-to-face time during this encounter.   Penni Homans, MD

## 2019-04-27 NOTE — Telephone Encounter (Signed)
Unable to leave voice message to inform pt of need to schedule annual exam in November.

## 2019-04-27 NOTE — Assessment & Plan Note (Signed)
Supplement and montor

## 2019-04-27 NOTE — Assessment & Plan Note (Signed)
Mild, asymptomatic, with leukopenia. 

## 2019-04-28 ENCOUNTER — Telehealth: Payer: Self-pay

## 2019-04-28 DIAGNOSIS — R739 Hyperglycemia, unspecified: Secondary | ICD-10-CM

## 2019-04-28 DIAGNOSIS — E079 Disorder of thyroid, unspecified: Secondary | ICD-10-CM

## 2019-04-28 DIAGNOSIS — E785 Hyperlipidemia, unspecified: Secondary | ICD-10-CM

## 2019-04-28 DIAGNOSIS — E559 Vitamin D deficiency, unspecified: Secondary | ICD-10-CM

## 2019-04-28 NOTE — Telephone Encounter (Signed)
Copied from [place] #[id number]. Topic: General - Other >> Apr 27, 2019 10:59 AM Leward Quan A wrote: Reason for CRM: Patient called to say that she can have labs done at [facility] but need orders faxed over to Fax# [phone] call back #  [phone] Blood is drawn on Monday and Thursday mornings. Please advise.

## 2019-05-04 ENCOUNTER — Encounter: Payer: Self-pay | Admitting: Family Medicine

## 2019-05-05 NOTE — Addendum Note (Signed)
Addended by: Magdalene Molly A on: 05/05/2019 03:24 PM   Modules accepted: Orders

## 2019-05-10 ENCOUNTER — Encounter: Payer: Self-pay | Admitting: Family Medicine

## 2019-05-14 ENCOUNTER — Encounter: Payer: Self-pay | Admitting: Family Medicine

## 2019-05-29 ENCOUNTER — Encounter: Payer: Self-pay | Admitting: Family Medicine

## 2019-06-09 ENCOUNTER — Encounter: Payer: Self-pay | Admitting: Family Medicine

## 2019-06-11 ENCOUNTER — Encounter: Payer: Self-pay | Admitting: Family Medicine

## 2019-08-08 ENCOUNTER — Other Ambulatory Visit: Payer: Self-pay | Admitting: Family Medicine

## 2019-11-17 ENCOUNTER — Other Ambulatory Visit: Payer: Self-pay | Admitting: Family Medicine

## 2019-11-18 NOTE — Telephone Encounter (Signed)
Last OV 04/27/19 Last refill 10/22/18 # 24 mL 0 Next OV not scheduled

## 2019-12-15 ENCOUNTER — Other Ambulatory Visit: Payer: Self-pay | Admitting: Family Medicine

## 2020-01-05 DIAGNOSIS — D0461 Carcinoma in situ of skin of right upper limb, including shoulder: Secondary | ICD-10-CM | POA: Diagnosis not present

## 2020-01-05 DIAGNOSIS — L570 Actinic keratosis: Secondary | ICD-10-CM | POA: Diagnosis not present

## 2020-01-05 DIAGNOSIS — Z23 Encounter for immunization: Secondary | ICD-10-CM | POA: Diagnosis not present

## 2020-02-07 DIAGNOSIS — H43813 Vitreous degeneration, bilateral: Secondary | ICD-10-CM | POA: Diagnosis not present

## 2020-02-12 ENCOUNTER — Other Ambulatory Visit: Payer: Self-pay | Admitting: Family Medicine

## 2020-03-16 ENCOUNTER — Other Ambulatory Visit: Payer: Self-pay | Admitting: Family Medicine

## 2020-04-13 ENCOUNTER — Ambulatory Visit: Payer: Medicare Other | Admitting: *Deleted

## 2020-04-17 NOTE — Progress Notes (Signed)
I connected with Angela Buck today by telephone and verified that I am speaking with the correct person using two identifiers. Location patient: home Location provider: work Persons participating in the virtual visit: patient, provider.   I discussed the limitations, risks, security and privacy concerns of performing an evaluation and management service by telephone and the availability of in person appointments. I also discussed with the patient that there may be a patient responsible charge related to this service. The patient expressed understanding and verbally consented to this telephonic visit.    Interactive audio and video telecommunications were attempted between this provider and patient, however failed, due to patient having technical difficulties OR patient did not have access to video capability.  We continued and completed visit with audio only.  Some vital signs may be absent or patient reported.    Subjective:   Angela Buck is a 79 y.o. female who presents for Medicare Annual (Subsequent) preventive examination.  Pt is working on a family photo book.  Review of Systems:  Home Safety/Smoke Alarms: Feels safe in home. Smoke alarms in place.  Lives w/ partner at [facility] in 1 story home.    Female:       Mammo- 04/13/18. ordered Dexa scan-  04/13/18. Ordered CCS- 12/05/10     Objective:     Vitals: SpO2 98% Comment: pt reported    Advanced Directives 04/18/2020 04/12/2019 04/09/2018 04/07/2017 04/07/2016 09/29/2015 09/15/2014  Does Patient Have a Medical Advance Directive? Yes Yes Yes Yes Yes Yes Yes  Type of Paramedic of [place];Living will [place];Living will [place];Living will [place];Living will [place];Living will [place];Living will -  Does patient want to make changes to medical advance directive? No - Patient declined No - Patient  declined - - - - -  Copy of [place] in Chart? No - copy requested No - copy requested Yes No - copy requested No - copy requested Yes -    Tobacco Social History   Tobacco Use  Smoking Status Never Smoker  Smokeless Tobacco Never Used     Counseling given: Not Answered   Clinical Intake:     Pain : No/denies pain                 Past Medical History:  Diagnosis Date  . Abdominal pain 03/16/2012  . 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 Chicken pox as a child  . Colonic polyp 01/05/2012  . Elevated BP 01/05/2012  . Fatigue 03/16/2012  . History of mumps   . Leukopenia 05/18/2014  . Mass of axilla 01/05/2012  . Measles as a child  . Medicare annual wellness visit, subsequent 01/05/2012   Sees Dr Collene Mares of Gastroenterology   . Neutropenia ([place]) 10/07/2016  . Osteopenia 01/05/2012  . Other and unspecified hyperlipidemia 05/18/2014  . Pain of left heel 04/07/2017  . Preventative health care 01/05/2012  . Preventative health care 10/07/2016  . Skin cancer 12/02/2009  . Thyroid disease 03/16/2012   right   . Vitamin D deficiency 04/07/2017   Past Surgical History:  Procedure Laterality Date  . biopsy of jawline  2011  . CATARACT EXTRACTION, BILATERAL     b/l  . laproscopy  1970   abdominal, endometriosis, with D&C  . right hip replacement  2003  . TONSILLECTOMY AND ADENOIDECTOMY  1949   Family History  Problem Relation  Age of Onset  . Arthritis Mother   . Hypertension Mother   . Dementia Mother   . Heart attack Mother   . Glaucoma Mother   . Heart disease Mother        MI, stent in 2004  . Emphysema Father        smoker  . Hypertension Father   . Glaucoma Father   . COPD Father   . Other Paternal Grandfather        enlarged heart  . Ulcers Maternal Grandmother    Social History   Socioeconomic History  . Marital status: Divorced    Spouse name: Not on file  . Number of children: Not on file  . Years of education: Not on file    . Highest education level: Not on file  Occupational History  . Not on file  Tobacco Use  . Smoking status: Never Smoker  . Smokeless tobacco: Never Used  Substance and Sexual Activity  . Alcohol use: Yes    Comment: occasional wine  . Drug use: No  . Sexual activity: Never    Partners: Female    Comment: lives with partner, no major dietary restrictions.   Other Topics Concern  . Not on file  Social History Narrative  . Not on file   Social Determinants of Health   Financial Resource Strain: Low Risk   . Difficulty of Paying Living Expenses: Not hard at all  Food Insecurity: No Food Insecurity  . Worried About Charity fundraiser in the Last Year: Never true  . Ran Out of Food in the Last Year: Never true  Transportation Needs: No Transportation Needs  . Lack of Transportation (Medical): No  . Lack of Transportation (Non-Medical): No  Physical Activity:   . Days of Exercise per Week:   . Minutes of Exercise per Session:   Stress:   . Feeling of Stress :   Social Connections:   . Frequency of Communication with Friends and Family:   . Frequency of Social Gatherings with Friends and Family:   . Attends Religious Services:   . Active Member of Clubs or Organizations:   . Attends Archivist Meetings:   Marland Kitchen Marital Status:     Outpatient Encounter Medications as of 04/18/2020  Medication Sig  . ARMOUR THYROID 15 MG tablet TAKE 2 TABLETS ON TUESDAY THURSDAY SATURDAY THEN TAKE 1 TABLET OTHER DAYS  . Ascorbic Acid (VITAMIN C) 100 MG tablet   . Vitamin D, Cholecalciferol, 10 MCG (400 UNIT) TABS   . [DISCONTINUED] cyanocobalamin (,VITAMIN B-12,) 1000 MCG/ML injection INJECT 1 ML INTO THE MUSCLE EVERY 14 DAYS (Patient not taking: Reported on 04/18/2020)  . [DISCONTINUED] Riboflavin (B-2 PO) Take by mouth.   No facility-administered encounter medications on file as of 04/18/2020.    Activities of Daily Living In your present state of health, do you have any  difficulty performing the following activities: 04/18/2020  Hearing? N  Vision? N  Difficulty concentrating or making decisions? N  Walking or climbing stairs? N  Dressing or bathing? N  Doing errands, shopping? N  Preparing Food and eating ? N  Using the Toilet? N  In the past six months, have you accidently leaked urine? N  Do you have problems with loss of bowel control? N  Managing your Medications? N  Managing your Finances? N  Housekeeping or managing your Housekeeping? N  Some recent data might be hidden    Patient Care Team: Penni Homans  A, MD as PCP - General (Family Medicine) Mosie Lukes, MD (Family Medicine) Jari Pigg, MD as Consulting Physician (Dermatology)    Assessment:   This is a routine wellness examination for [place]. Physical assessment deferred to PCP.  Exercise Activities and Dietary recommendations Current Exercise Habits: Home exercise routine, Type of exercise: yoga, Time (Minutes): 30, Frequency (Times/Week): 3, Weekly Exercise (Minutes/Week): 90, Intensity: Mild, Exercise limited by: None identified Diet (meal preparation, eat out, water intake, caffeinated beverages, dairy products, fruits and vegetables): well balanced   Goals    . Continue being active.    . Travel back to [place]  04/18/2020 04/12/2019 04/09/2018 04/07/2017 10/07/2016  Falls in the past year? 0 0 No No No  Number falls in past yr: 0 - - - -  Injury with Fall? 0 - - - -  Follow up Education provided;Falls prevention discussed - - - -   Depression Screen PHQ 2/9 Scores 04/18/2020 04/12/2019 04/09/2018 04/07/2017  PHQ - 2 Score 0 0 0 0     Cognitive Function   Ad8 score reviewed for issues:  Issues making decisions:no  Less interest in hobbies / activities:no  Repeats questions, stories (family complaining):no  Trouble using ordinary gadgets (microwave, computer, phone):no  Forgets the month or year: no  Mismanaging finances:  no  Remembering appts:no  Daily problems with thinking and/or memory:no Ad8 score is=0       There is no immunization history on file for this patient.   Screening Tests Health Maintenance  Topic Date Due  . COVID-19 Vaccine (1) Never done  . TETANUS/TDAP  Never done  . PNA vac Low Risk Adult (1 of 2 - PCV13) Never done  . DEXA SCAN  Completed  . INFLUENZA VACCINE  Discontinued     Plan:    Please schedule your next medicare wellness visit with me in 1 yr.  Continue to eat heart healthy diet (full of fruits, vegetables, whole grains, lean protein, water--limit salt, fat, and sugar intake) and increase physical activity as tolerated.  Continue doing brain stimulating activities (puzzles, reading, adult coloring books, staying active) to keep memory sharp.   I have ordered your bone density and mammogram.   I have personally reviewed and noted the following in the patient's chart:   . Medical and social history . Use of alcohol, tobacco or illicit drugs  . Current medications and supplements . Functional ability and status . Nutritional status . Physical activity . Advanced directives . List of other physicians . Hospitalizations, surgeries, and ER visits in previous 12 months . Vitals . Screenings to include cognitive, depression, and falls . Referrals and appointments  In addition, I have reviewed and discussed with patient certain preventive protocols, quality metrics, and best practice recommendations. A written personalized care plan for preventive services as well as general preventive health recommendations were provided to patient.     Naaman Plummer [place], [place]  04/18/2020

## 2020-04-18 ENCOUNTER — Encounter: Payer: Self-pay | Admitting: Family Medicine

## 2020-04-18 ENCOUNTER — Ambulatory Visit (INDEPENDENT_AMBULATORY_CARE_PROVIDER_SITE_OTHER): Payer: Medicare PPO | Admitting: *Deleted

## 2020-04-18 ENCOUNTER — Other Ambulatory Visit: Payer: Self-pay

## 2020-04-18 DIAGNOSIS — Z780 Asymptomatic menopausal state: Secondary | ICD-10-CM

## 2020-04-18 DIAGNOSIS — Z1231 Encounter for screening mammogram for malignant neoplasm of breast: Secondary | ICD-10-CM

## 2020-04-18 DIAGNOSIS — Z0000 Encounter for general adult medical examination without abnormal findings: Secondary | ICD-10-CM | POA: Diagnosis not present

## 2020-04-18 NOTE — Patient Instructions (Signed)
Please schedule your next medicare wellness visit with me in 1 yr.  Continue to eat heart healthy diet (full of fruits, vegetables, whole grains, lean protein, water--limit salt, fat, and sugar intake) and increase physical activity as tolerated.  Continue doing brain stimulating activities (puzzles, reading, adult coloring books, staying active) to keep memory sharp.   I have ordered your bone density and mammogram.    Angela Buck , Thank you for taking time to come for your Medicare Wellness Visit. I appreciate your ongoing commitment to your health goals. Please review the following plan we discussed and let me know if I can assist you in the future.   These are the goals we discussed: Goals    . Continue being active.    . Travel back to [place]       This is a list of the screening recommended for you and due dates:  Health Maintenance  Topic Date Due  . COVID-19 Vaccine (1) Never done  . Tetanus Vaccine  Never done  . Pneumonia vaccines (1 of 2 - PCV13) Never done  . DEXA scan (bone density measurement)  Completed  . Flu Shot  Discontinued    Preventive Care 65 Years and Older, Female Preventive care refers to lifestyle choices and visits with your health care provider that can promote health and wellness. This includes:  A yearly physical exam. This is also called an annual well check.  Regular dental and eye exams.  Immunizations.  Screening for certain conditions.  Healthy lifestyle choices, such as diet and exercise. What can I expect for my preventive care visit? Physical exam Your health care provider will check:  Height and weight. These may be used to calculate body mass index (BMI), which is a measurement that tells if you are at a healthy weight.  Heart rate and blood pressure.  Your skin for abnormal spots. Counseling Your health care provider may ask you questions about:  Alcohol, tobacco, and drug use.  Emotional well-being.  Home and  relationship well-being.  Sexual activity.  Eating habits.  History of falls.  Memory and ability to understand (cognition).  Work and work Statistician.  Pregnancy and menstrual history. What immunizations do I need?  Influenza (flu) vaccine  This is recommended every year. Tetanus, diphtheria, and pertussis (Tdap) vaccine  You may need a Td booster every 10 years. Varicella (chickenpox) vaccine  You may need this vaccine if you have not already been vaccinated. Zoster (shingles) vaccine  You may need this after age 64. Pneumococcal conjugate (PCV13) vaccine  One dose is recommended after age 70. Pneumococcal polysaccharide (PPSV23) vaccine  One dose is recommended after age 63. Measles, mumps, and rubella (MMR) vaccine  You may need at least one dose of MMR if you were born in 1957 or later. You may also need a second dose. Meningococcal conjugate (MenACWY) vaccine  You may need this if you have certain conditions. Hepatitis A vaccine  You may need this if you have certain conditions or if you travel or work in places where you may be exposed to hepatitis A. Hepatitis B vaccine  You may need this if you have certain conditions or if you travel or work in places where you may be exposed to hepatitis B. Haemophilus influenzae type b (Hib) vaccine  You may need this if you have certain conditions. You may receive vaccines as individual doses or as more than one vaccine together in one shot (combination vaccines). Talk with your health  care provider about the risks and benefits of combination vaccines. What tests do I need? Blood tests  Lipid and cholesterol levels. These may be checked every 5 years, or more frequently depending on your overall health.  Hepatitis C test.  Hepatitis B test. Screening  Lung cancer screening. You may have this screening every year starting at age 34 if you have a 30-pack-year history of smoking and currently smoke or have quit  within the past 15 years.  Colorectal cancer screening. All adults should have this screening starting at age 62 and continuing until age 27. Your health care provider may recommend screening at age 63 if you are at increased risk. You will have tests every 1-10 years, depending on your results and the type of screening test.  Diabetes screening. This is done by checking your blood sugar (glucose) after you have not eaten for a while (fasting). You may have this done every 1-3 years.  Mammogram. This may be done every 1-2 years. Talk with your health care provider about how often you should have regular mammograms.  BRCA-related cancer screening. This may be done if you have a family history of breast, ovarian, tubal, or peritoneal cancers. Other tests  Sexually transmitted disease (STD) testing.  Bone density scan. This is done to screen for osteoporosis. You may have this done starting at age 63. Follow these instructions at home: Eating and drinking  Eat a diet that includes fresh fruits and vegetables, whole grains, lean protein, and low-fat dairy products. Limit your intake of foods with high amounts of sugar, saturated fats, and salt.  Take vitamin and mineral supplements as recommended by your health care provider.  Do not drink alcohol if your health care provider tells you not to drink.  If you drink alcohol: ? Limit how much you have to 0-1 drink a day. ? Be aware of how much alcohol is in your drink. In the [place] one drink equals one 12 oz bottle of beer (355 mL), one 5 oz glass of wine (148 mL), or one 1 oz glass of hard liquor (44 mL). Lifestyle  Take daily care of your teeth and gums.  Stay active. Exercise for at least 30 minutes on 5 or more days each week.  Do not use any products that contain nicotine or tobacco, such as cigarettes, e-cigarettes, and chewing tobacco. If you need help quitting, ask your health care provider.  If you are sexually active, practice  safe sex. Use a condom or other form of protection in order to prevent STIs (sexually transmitted infections).  Talk with your health care provider about taking a low-dose aspirin or statin. What's next?  Go to your health care provider once a year for a well check visit.  Ask your health care provider how often you should have your eyes and teeth checked.  Stay up to date on all vaccines. This information is not intended to replace advice given to you by your health care provider. Make sure you discuss any questions you have with your health care provider. Document Revised: 11/12/2018 Document Reviewed: 11/12/2018 Elsevier Patient Education  2020 [employer].

## 2020-04-24 ENCOUNTER — Ambulatory Visit (HOSPITAL_BASED_OUTPATIENT_CLINIC_OR_DEPARTMENT_OTHER)
Admission: RE | Admit: 2020-04-24 | Discharge: 2020-04-24 | Disposition: A | Discharge status: Home / Self Care | Payer: Medicare PPO | Source: Ambulatory Visit | Attending: Family Medicine | Admitting: Family Medicine

## 2020-04-24 ENCOUNTER — Other Ambulatory Visit: Payer: Self-pay

## 2020-04-24 DIAGNOSIS — M810 Age-related osteoporosis without current pathological fracture: Secondary | ICD-10-CM | POA: Diagnosis not present

## 2020-04-24 DIAGNOSIS — M8588 Other specified disorders of bone density and structure, other site: Secondary | ICD-10-CM | POA: Diagnosis not present

## 2020-04-24 DIAGNOSIS — Z780 Asymptomatic menopausal state: Secondary | ICD-10-CM | POA: Insufficient documentation

## 2020-04-24 DIAGNOSIS — Z1231 Encounter for screening mammogram for malignant neoplasm of breast: Secondary | ICD-10-CM | POA: Diagnosis not present

## 2020-04-27 DIAGNOSIS — L570 Actinic keratosis: Secondary | ICD-10-CM | POA: Diagnosis not present

## 2020-04-27 DIAGNOSIS — Z85828 Personal history of other malignant neoplasm of skin: Secondary | ICD-10-CM | POA: Diagnosis not present

## 2020-04-27 DIAGNOSIS — L578 Other skin changes due to chronic exposure to nonionizing radiation: Secondary | ICD-10-CM | POA: Diagnosis not present

## 2020-06-01 ENCOUNTER — Ambulatory Visit: Payer: Medicare PPO | Admitting: Family Medicine

## 2020-06-01 ENCOUNTER — Other Ambulatory Visit: Payer: Self-pay

## 2020-06-01 VITALS — BP 113/55 | HR 83 | Temp 97.6°F | Resp 16 | Ht 63.9 in | Wt 143.0 lb

## 2020-06-01 DIAGNOSIS — M8580 Other specified disorders of bone density and structure, unspecified site: Secondary | ICD-10-CM | POA: Diagnosis not present

## 2020-06-01 DIAGNOSIS — R739 Hyperglycemia, unspecified: Secondary | ICD-10-CM | POA: Diagnosis not present

## 2020-06-01 DIAGNOSIS — E079 Disorder of thyroid, unspecified: Secondary | ICD-10-CM

## 2020-06-01 DIAGNOSIS — E785 Hyperlipidemia, unspecified: Secondary | ICD-10-CM | POA: Diagnosis not present

## 2020-06-01 DIAGNOSIS — E559 Vitamin D deficiency, unspecified: Secondary | ICD-10-CM

## 2020-06-01 DIAGNOSIS — D709 Neutropenia, unspecified: Secondary | ICD-10-CM | POA: Diagnosis not present

## 2020-06-01 LAB — COMPREHENSIVE METABOLIC PANEL
ALT: 14 U/L (ref 0–35)
AST: 23 U/L (ref 0–37)
Albumin: 3.9 g/dL (ref 3.5–5.2)
Alkaline Phosphatase: 75 U/L (ref 39–117)
BUN: 27 mg/dL — ABNORMAL HIGH (ref 6–23)
CO2: 30 mEq/L (ref 19–32)
Calcium: 9.5 mg/dL (ref 8.4–10.5)
Chloride: 103 mEq/L (ref 96–112)
Creatinine, Ser: 1.17 mg/dL (ref 0.40–1.20)
GFR: 44.86 mL/min — ABNORMAL LOW (ref >60.00)
Glucose, Bld: 93 mg/dL (ref 70–99)
Potassium: 5.1 mEq/L (ref 3.5–5.1)
Sodium: 140 mEq/L (ref 135–145)
Total Bilirubin: 0.5 mg/dL (ref 0.2–1.2)
Total Protein: 6.4 g/dL (ref 6.0–8.3)

## 2020-06-01 LAB — CBC
HCT: 37.9 % (ref 36.0–46.0)
Hemoglobin: 12.6 g/dL (ref 12.0–15.0)
MCHC: 33.2 g/dL (ref 30.0–36.0)
MCV: 91.4 fl (ref 78.0–100.0)
Platelets: 313 10*3/uL (ref 150.0–400.0)
RBC: 4.15 Mil/uL (ref 3.87–5.11)
RDW: 13.6 % (ref 11.5–15.5)
WBC: 7.4 10*3/uL (ref 4.0–10.5)

## 2020-06-01 LAB — LIPID PANEL
Cholesterol: 160 mg/dL (ref 0–200)
HDL: 65.2 mg/dL (ref >39.00)
LDL Cholesterol: 68 mg/dL (ref 0–99)
NonHDL: 94.58
Total CHOL/HDL Ratio: 2
Triglycerides: 131 mg/dL (ref 0.0–149.0)
VLDL: 26.2 mg/dL (ref 0.0–40.0)

## 2020-06-01 LAB — T4, FREE: Free T4: 0.8 ng/dL (ref 0.60–1.60)

## 2020-06-01 LAB — HEMOGLOBIN A1C: Hgb A1c MFr Bld: 5.8 % (ref 4.6–6.5)

## 2020-06-01 LAB — TSH: TSH: 8.84 u[IU]/mL — ABNORMAL HIGH (ref 0.35–4.50)

## 2020-06-01 LAB — VITAMIN D 25 HYDROXY (VIT D DEFICIENCY, FRACTURES): VITD: 47.17 ng/mL (ref 30.00–100.00)

## 2020-06-01 NOTE — Assessment & Plan Note (Signed)
hgba1c acceptable, minimize simple carbs. Increase exercise as tolerated.  

## 2020-06-01 NOTE — Assessment & Plan Note (Signed)
Supplement and monitor 

## 2020-06-01 NOTE — Assessment & Plan Note (Addendum)
encouraged heart healthy diet, avoid trans fats, minimize simple carbs and saturated fats. Increase exercise as tolerated 

## 2020-06-02 ENCOUNTER — Other Ambulatory Visit: Payer: Self-pay | Admitting: *Deleted

## 2020-06-02 ENCOUNTER — Telehealth: Payer: Self-pay | Admitting: *Deleted

## 2020-06-02 DIAGNOSIS — R944 Abnormal results of kidney function studies: Secondary | ICD-10-CM

## 2020-06-02 NOTE — Telephone Encounter (Signed)
-----   Message from Mosie Lukes, MD sent at 06/02/2020  8:30 AM EDT ----- Notify TSH up further recommend taking Armour Thyroid 15 mg tabs but increase to 2 tabs daily x 5 days and 1 tab po the other 2 days maybe Tues and Sat. Also she appeared a little dehydrated so hydrate better and if she started in any new over the counter supplements she should stop them and then we should recheck her cmp in about a month to see if her GFR has improved.

## 2020-06-02 NOTE — Telephone Encounter (Signed)
Advised patient of results.  She stated that she has been taken her thyroid medication differently than what is in the chart.  She has been taking 1 (15mg ) tablet daily and 2 (30mg ) on Tuesday.  With her taking it this way do you want to change or keep it the way in the result notes?

## 2020-06-03 NOTE — Telephone Encounter (Signed)
If that is the case have her take 30 mg 3 days a week and 15 mg 4 days a week

## 2020-06-05 NOTE — Assessment & Plan Note (Signed)
Encouraged to get adequate exercise, calcium and vitamin d intake 

## 2020-06-05 NOTE — Progress Notes (Signed)
Subjective:    Patient ID: Angela Buck, female    DOB: [birth year], 79 y.o.   MRN: [id number]  No chief complaint on file.   HPI Patient is in today for follow up on chronic medical concerns. She has been under a great deal of stress due to her partner's poor health but she reports she feels well. No recent febrile illness or hospitalizations. Denies CP/palp/SOB/HA/congestion/fevers/GI or GU c/o. Taking meds as prescribed  Past Medical History:  Diagnosis Date  . Abdominal pain 03/16/2012  . 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 Chicken pox as a child  . Colonic polyp 01/05/2012  . Elevated BP 01/05/2012  . Fatigue 03/16/2012  . History of mumps   . Leukopenia 05/18/2014  . Mass of axilla 01/05/2012  . Measles as a child  . Medicare annual wellness visit, subsequent 01/05/2012   Sees Dr Collene Mares of G[facility]   . Neutropenia ([facility]) 10/07/2016  . Osteopenia 01/05/2012  . Other and unspecified hyperlipidemia 05/18/2014  . Pain of left heel 04/07/2017  . Preventative health care 01/05/2012  . Preventative health care 10/07/2016  . Skin cancer 12/02/2009  . Thyroid disease 03/16/2012   right   . Vitamin D deficiency 04/07/2017    Past Surgical History:  Procedure Laterality Date  . biopsy of jawline  2011  . CATARACT EXTRACTION, BILATERAL     b/l  . laproscopy  1970   abdominal, endometriosis, with D&C  . right hip replacement  2003  . TONSILLECTOMY AND ADENOIDECTOMY  1949    Family History  Problem Relation Age of Onset  . Arthritis Mother   . Hypertension Mother   . Dementia Mother   . Heart attack Mother   . Glaucoma Mother   . Heart disease Mother        MI, stent in 2004  . Emphysema Father        smoker  . Hypertension Father   . Glaucoma Father   . COPD Father   . Other Paternal Grandfather        enlarged heart  . Ulcers Maternal Grandmother     Social History   Socioeconomic History  . Marital status: Divorced    Spouse name: Not on file  .  Number of children: Not on file  . Years of education: Not on file  . Highest education level: Not on file  Occupational History  . Not on file  Tobacco Use  . Smoking status: Never Smoker  . Smokeless tobacco: Never Used  Substance and Sexual Activity  . Alcohol use: Yes    Comment: occasional wine  . Drug use: No  . Sexual activity: Never    Partners: Female    Comment: lives with partner, no major dietary restrictions.   Other Topics Concern  . Not on file  Social History Narrative  . Not on file   Social Determinants of Health   Financial Resource Strain: Low Risk   . Difficulty of Paying Living Expenses: Not hard at all  Food Insecurity: No Food Insecurity  . Worried About Charity fundraiser in the Last Year: Never true  . Ran Out of Food in the Last Year: Never true  Transportation Needs: No Transportation Needs  . Lack of Transportation (Medical): No  . Lack of Transportation (Non-Medical): No  Physical Activity:   . Days of Exercise per Week:   . Minutes of Exercise per Session:   Stress:   .  Feeling of Stress :   Social Connections:   . Frequency of Communication with Friends and Family:   . Frequency of Social Gatherings with Friends and Family:   . Attends Religious Services:   . Active Member of Clubs or Organizations:   . Attends Archivist Meetings:   Marland Kitchen Marital Status:   Intimate Partner Violence:   . Fear of Current or Ex-Partner:   . Emotionally Abused:   Marland Kitchen Physically Abused:   . Sexually Abused:     Outpatient Medications Prior to Visit  Medication Sig Dispense Refill  . ARMOUR THYROID 15 MG tablet TAKE 2 TABLETS ON TUESDAY THURSDAY SATURDAY THEN TAKE 1 TABLET OTHER DAYS 128 tablet 1  . Ascorbic Acid (VITAMIN C) 100 MG tablet     . Vitamin D, Cholecalciferol, 10 MCG (400 UNIT) TABS      No facility-administered medications prior to visit.    Allergies  Allergen Reactions  . Bee Venom Anaphylaxis  . Penicillins Hives    Has  patient had a PCN reaction causing immediate rash, facial/tongue/throat swelling, SOB or lightheadedness with hypotension: No Has patient had a PCN reaction causing severe rash involving mucus membranes or skin necrosis: No Has patient had a PCN reaction that required hospitalization No Has patient had a PCN reaction occurring within the last 10 years: No If all of the above answers are "NO", then may proceed with Cephalosporin use.    Review of Systems  Constitutional: Positive for malaise/fatigue. Negative for fever.  HENT: Negative for congestion.   Eyes: Negative for blurred vision.  Respiratory: Negative for shortness of breath.   Cardiovascular: Negative for chest pain, palpitations and leg swelling.  Gastrointestinal: Negative for abdominal pain, blood in stool and nausea.  Genitourinary: Negative for dysuria and frequency.  Musculoskeletal: Negative for falls.  Skin: Negative for rash.  Neurological: Negative for dizziness, loss of consciousness and headaches.  Endo/Heme/Allergies: Negative for environmental allergies.  Psychiatric/Behavioral: Negative for depression. The patient is not nervous/anxious.        Objective:    Physical Exam Vitals and nursing note reviewed.  Constitutional:      General: She is not in acute distress.    Appearance: She is well-developed.  HENT:     Head: Normocephalic and atraumatic.     Nose: Nose normal.  Eyes:     General:        Right eye: No discharge.        Left eye: No discharge.  Cardiovascular:     Rate and Rhythm: Normal rate and regular rhythm.     Heart sounds: No murmur heard.   Pulmonary:     Effort: Pulmonary effort is normal.     Breath sounds: Normal breath sounds.  Abdominal:     General: Bowel sounds are normal.     Palpations: Abdomen is soft.     Tenderness: There is no abdominal tenderness.  Musculoskeletal:     Cervical back: Normal range of motion and neck supple.  Skin:    General: Skin is warm and  dry.  Neurological:     Mental Status: She is alert and oriented to person, place, and time.     BP (!) 113/55 (BP Location: Right Arm, Patient Position: Sitting, Cuff Size: Small)   Pulse 83   Temp 97.6 F (36.4 C) (Temporal)   Resp 16   Ht 5' 3.9" (1.623 m)   Wt 143 lb (64.9 kg)   SpO2 97%   BMI  24.62 kg/m  Wt Readings from Last 3 Encounters:  06/01/20 143 lb (64.9 kg)  04/27/19 139 lb (63 kg)  10/22/18 141 lb 12.8 oz (64.3 kg)    Diabetic Foot Exam - Simple   No data filed     Lab Results  Component Value Date   WBC 7.4 06/01/2020   HGB 12.6 06/01/2020   HCT 37.9 06/01/2020   PLT 313.0 06/01/2020   GLUCOSE 93 06/01/2020   CHOL 160 06/01/2020   TRIG 131.0 06/01/2020   HDL 65.20 06/01/2020   LDLCALC 68 06/01/2020   ALT 14 06/01/2020   AST 23 06/01/2020   NA 140 06/01/2020   K 5.1 06/01/2020   CL 103 06/01/2020   CREATININE 1.17 06/01/2020   BUN 27 (H) 06/01/2020   CO2 30 06/01/2020   TSH 8.84 (H) 06/01/2020   HGBA1C 5.8 06/01/2020    Lab Results  Component Value Date   TSH 8.84 (H) 06/01/2020   Lab Results  Component Value Date   WBC 7.4 06/01/2020   HGB 12.6 06/01/2020   HCT 37.9 06/01/2020   MCV 91.4 06/01/2020   PLT 313.0 06/01/2020   Lab Results  Component Value Date   NA 140 06/01/2020   K 5.1 06/01/2020   CO2 30 06/01/2020   GLUCOSE 93 06/01/2020   BUN 27 (H) 06/01/2020   CREATININE 1.17 06/01/2020   BILITOT 0.5 06/01/2020   ALKPHOS 75 06/01/2020   AST 23 06/01/2020   ALT 14 06/01/2020   PROT 6.4 06/01/2020   ALBUMIN 3.9 06/01/2020   CALCIUM 9.5 06/01/2020   GFR 44.86 (L) 06/01/2020   Lab Results  Component Value Date   CHOL 160 06/01/2020   Lab Results  Component Value Date   HDL 65.20 06/01/2020   Lab Results  Component Value Date   LDLCALC 68 06/01/2020   Lab Results  Component Value Date   TRIG 131.0 06/01/2020   Lab Results  Component Value Date   CHOLHDL 2 06/01/2020   Lab Results  Component Value Date    HGBA1C 5.8 06/01/2020       Assessment & Plan:   Problem List Items Addressed This Visit    Osteopenia    Encouraged to get adequate exercise, calcium and vitamin d intake      Thyroid disease    Supplement and monitor      Relevant Orders   TSH (Completed)   T4, free (Completed)   Hyperlipidemia, mild - Primary     encouraged heart healthy diet, avoid trans fats, minimize simple carbs and saturated fats. Increase exercise as tolerated      Relevant Orders   Lipid panel (Completed)   Neutropenia (HCC)   Relevant Orders   CBC (Completed)   Vitamin D deficiency    Supplement and monitor      Relevant Orders   VITAMIN D 25 Hydroxy (Vit-D Deficiency, Fractures) (Completed)   Hyperglycemia    hgba1c acceptable, minimize simple carbs. Increase exercise as tolerated.       Relevant Orders   Comprehensive metabolic panel (Completed)   Hemoglobin A1c (Completed)      I am having Angela Buck maintain her Armour Thyroid, Vitamin D (Cholecalciferol), and vitamin C.  No orders of the defined types were placed in this encounter.    Penni Homans, MD

## 2020-06-06 NOTE — Telephone Encounter (Signed)
Left message on machine to call back  

## 2020-06-07 MED ORDER — ARMOUR THYROID 15 MG PO TABS: ORAL_TABLET | ORAL | 1 refills | Status: DC

## 2020-06-07 NOTE — Telephone Encounter (Signed)
Advised patient of change and new rx sent in.

## 2020-06-29 ENCOUNTER — Other Ambulatory Visit: Payer: Self-pay

## 2020-06-29 ENCOUNTER — Other Ambulatory Visit (INDEPENDENT_AMBULATORY_CARE_PROVIDER_SITE_OTHER): Payer: Medicare PPO

## 2020-06-29 DIAGNOSIS — R944 Abnormal results of kidney function studies: Secondary | ICD-10-CM | POA: Diagnosis not present

## 2020-06-29 LAB — COMPREHENSIVE METABOLIC PANEL
ALT: 13 U/L (ref 0–35)
AST: 20 U/L (ref 0–37)
Albumin: 3.7 g/dL (ref 3.5–5.2)
Alkaline Phosphatase: 71 U/L (ref 39–117)
BUN: 29 mg/dL — ABNORMAL HIGH (ref 6–23)
CO2: 27 mEq/L (ref 19–32)
Calcium: 9.3 mg/dL (ref 8.4–10.5)
Chloride: 106 mEq/L (ref 96–112)
Creatinine, Ser: 1.07 mg/dL (ref 0.40–1.20)
GFR: 49.72 mL/min — ABNORMAL LOW (ref >60.00)
Glucose, Bld: 90 mg/dL (ref 70–99)
Potassium: 4.3 mEq/L (ref 3.5–5.1)
Sodium: 137 mEq/L (ref 135–145)
Total Bilirubin: 0.3 mg/dL (ref 0.2–1.2)
Total Protein: 6.5 g/dL (ref 6.0–8.3)

## 2020-06-30 ENCOUNTER — Encounter: Payer: Self-pay | Admitting: Family Medicine

## 2020-07-03 ENCOUNTER — Other Ambulatory Visit: Payer: Medicare PPO

## 2020-08-27 DIAGNOSIS — Z20822 Contact with and (suspected) exposure to covid-19: Secondary | ICD-10-CM | POA: Diagnosis not present

## 2020-10-19 DIAGNOSIS — L821 Other seborrheic keratosis: Secondary | ICD-10-CM | POA: Diagnosis not present

## 2020-10-19 DIAGNOSIS — Z85828 Personal history of other malignant neoplasm of skin: Secondary | ICD-10-CM | POA: Diagnosis not present

## 2020-10-19 DIAGNOSIS — L570 Actinic keratosis: Secondary | ICD-10-CM | POA: Diagnosis not present

## 2020-10-19 DIAGNOSIS — D485 Neoplasm of uncertain behavior of skin: Secondary | ICD-10-CM | POA: Diagnosis not present

## 2020-10-19 DIAGNOSIS — L578 Other skin changes due to chronic exposure to nonionizing radiation: Secondary | ICD-10-CM | POA: Diagnosis not present

## 2020-10-19 DIAGNOSIS — D0461 Carcinoma in situ of skin of right upper limb, including shoulder: Secondary | ICD-10-CM | POA: Diagnosis not present

## 2020-10-19 DIAGNOSIS — D2271 Melanocytic nevi of right lower limb, including hip: Secondary | ICD-10-CM | POA: Diagnosis not present

## 2020-10-26 ENCOUNTER — Other Ambulatory Visit: Payer: Self-pay | Admitting: Family Medicine

## 2020-12-04 ENCOUNTER — Ambulatory Visit: Payer: Medicare PPO | Admitting: Family Medicine

## 2021-01-04 DIAGNOSIS — Z1211 Encounter for screening for malignant neoplasm of colon: Secondary | ICD-10-CM | POA: Diagnosis not present

## 2021-01-09 ENCOUNTER — Other Ambulatory Visit: Payer: Self-pay

## 2021-01-09 ENCOUNTER — Ambulatory Visit: Payer: Medicare PPO | Admitting: Family Medicine

## 2021-01-09 ENCOUNTER — Encounter: Payer: Self-pay | Admitting: Family Medicine

## 2021-01-09 VITALS — BP 106/60 | HR 78 | Temp 98.2°F | Resp 16 | Wt 144.0 lb

## 2021-01-09 DIAGNOSIS — R030 Elevated blood-pressure reading, without diagnosis of hypertension: Secondary | ICD-10-CM

## 2021-01-09 DIAGNOSIS — E079 Disorder of thyroid, unspecified: Secondary | ICD-10-CM | POA: Diagnosis not present

## 2021-01-09 DIAGNOSIS — E785 Hyperlipidemia, unspecified: Secondary | ICD-10-CM | POA: Diagnosis not present

## 2021-01-09 DIAGNOSIS — E559 Vitamin D deficiency, unspecified: Secondary | ICD-10-CM

## 2021-01-09 DIAGNOSIS — R739 Hyperglycemia, unspecified: Secondary | ICD-10-CM | POA: Diagnosis not present

## 2021-01-09 DIAGNOSIS — F4321 Adjustment disorder with depressed mood: Secondary | ICD-10-CM

## 2021-01-09 DIAGNOSIS — D709 Neutropenia, unspecified: Secondary | ICD-10-CM

## 2021-01-09 LAB — COMPREHENSIVE METABOLIC PANEL
ALT: 16 U/L (ref 0–35)
AST: 22 U/L (ref 0–37)
Albumin: 3.9 g/dL (ref 3.5–5.2)
Alkaline Phosphatase: 77 U/L (ref 39–117)
BUN: 24 mg/dL — ABNORMAL HIGH (ref 6–23)
CO2: 33 mEq/L — ABNORMAL HIGH (ref 19–32)
Calcium: 9.6 mg/dL (ref 8.4–10.5)
Chloride: 102 mEq/L (ref 96–112)
Creatinine, Ser: 0.99 mg/dL (ref 0.40–1.20)
GFR: 54.98 mL/min — ABNORMAL LOW (ref >60.00)
Glucose, Bld: 85 mg/dL (ref 70–99)
Potassium: 4.8 mEq/L (ref 3.5–5.1)
Sodium: 138 mEq/L (ref 135–145)
Total Bilirubin: 0.4 mg/dL (ref 0.2–1.2)
Total Protein: 6.5 g/dL (ref 6.0–8.3)

## 2021-01-09 LAB — CBC
HCT: 37.8 % (ref 36.0–46.0)
Hemoglobin: 12.5 g/dL (ref 12.0–15.0)
MCHC: 33 g/dL (ref 30.0–36.0)
MCV: 91 fl (ref 78.0–100.0)
Platelets: 305 10*3/uL (ref 150.0–400.0)
RBC: 4.16 Mil/uL (ref 3.87–5.11)
RDW: 14.2 % (ref 11.5–15.5)
WBC: 6.5 10*3/uL (ref 4.0–10.5)

## 2021-01-09 LAB — VITAMIN D 25 HYDROXY (VIT D DEFICIENCY, FRACTURES): VITD: 35.38 ng/mL (ref 30.00–100.00)

## 2021-01-09 LAB — LIPID PANEL
Cholesterol: 194 mg/dL (ref 0–200)
HDL: 72 mg/dL (ref >39.00)
LDL Cholesterol: 101 mg/dL — ABNORMAL HIGH (ref 0–99)
NonHDL: 122.48
Total CHOL/HDL Ratio: 3
Triglycerides: 105 mg/dL (ref 0.0–149.0)
VLDL: 21 mg/dL (ref 0.0–40.0)

## 2021-01-09 LAB — TSH: TSH: 9.22 u[IU]/mL — ABNORMAL HIGH (ref 0.35–4.50)

## 2021-01-09 LAB — T4, FREE: Free T4: 0.74 ng/dL (ref 0.60–1.60)

## 2021-01-09 LAB — HEMOGLOBIN A1C: Hgb A1c MFr Bld: 5.9 % (ref 4.6–6.5)

## 2021-01-09 NOTE — Patient Instructions (Signed)
NOW Sleep medicine MindBodyGreen Sleep Support plus  L Tryptophan  Capsules or warm milk  Insomnia Insomnia is a sleep disorder that makes it difficult to fall asleep or stay asleep. Insomnia can cause fatigue, low energy, difficulty concentrating, mood swings, and poor performance at work or school. There are three different ways to classify insomnia:  Difficulty falling asleep.  Difficulty staying asleep.  Waking up too early in the morning. Any type of insomnia can be long-term (chronic) or short-term (acute). Both are common. Short-term insomnia usually lasts for three months or less. Chronic insomnia occurs at least three times a week for longer than three months. What are the causes? Insomnia may be caused by another condition, situation, or substance, such as:  Anxiety.  Certain medicines.  Gastroesophageal reflux disease (GERD) or other gastrointestinal conditions.  Asthma or other breathing conditions.  Restless legs syndrome, sleep apnea, or other sleep disorders.  Chronic pain.  Menopause.  Stroke.  Abuse of alcohol, tobacco, or illegal drugs.  Mental health conditions, such as depression.  Caffeine.  Neurological disorders, such as Alzheimer's disease.  An overactive thyroid (hyperthyroidism). Sometimes, the cause of insomnia may not be known. What increases the risk? Risk factors for insomnia include:  Gender. Women are affected more often than men.  Age. Insomnia is more common as you get older.  Stress.  Lack of exercise.  Irregular work schedule or working night shifts.  Traveling between different time zones.  Certain medical and mental health conditions. What are the signs or symptoms? If you have insomnia, the main symptom is having trouble falling asleep or having trouble staying asleep. This may lead to other symptoms, such as:  Feeling fatigued or having low energy.  Feeling nervous about going to sleep.  Not feeling rested in  the morning.  Having trouble concentrating.  Feeling irritable, anxious, or depressed. How is this diagnosed? This condition may be diagnosed based on:  Your symptoms and medical history. Your health care provider may ask about: ? Your sleep habits. ? Any medical conditions you have. ? Your mental health.  A physical exam. How is this treated? Treatment for insomnia depends on the cause. Treatment may focus on treating an underlying condition that is causing insomnia. Treatment may also include:  Medicines to help you sleep.  Counseling or therapy.  Lifestyle adjustments to help you sleep better. Follow these instructions at home: Eating and drinking  Limit or avoid alcohol, caffeinated beverages, and cigarettes, especially close to bedtime. These can disrupt your sleep.  Do not eat a large meal or eat spicy foods right before bedtime. This can lead to digestive discomfort that can make it hard for you to sleep.   Sleep habits  Keep a sleep diary to help you and your health care provider figure out what could be causing your insomnia. Write down: ? When you sleep. ? When you wake up during the night. ? How well you sleep. ? How rested you feel the next day. ? Any side effects of medicines you are taking. ? What you eat and drink.  Make your bedroom a dark, comfortable place where it is easy to fall asleep. ? Put up shades or blackout curtains to block light from outside. ? Use a white noise machine to block noise. ? Keep the temperature cool.  Limit screen use before bedtime. This includes: ? Watching TV. ? Using your smartphone, tablet, or computer.  Stick to a routine that includes going to bed and waking up  at the same times every day and night. This can help you fall asleep faster. Consider making a quiet activity, such as reading, part of your nighttime routine.  Try to avoid taking naps during the day so that you sleep better at night.  Get out of bed if you  are still awake after 15 minutes of trying to sleep. Keep the lights down, but try reading or doing a quiet activity. When you feel sleepy, go back to bed.   General instructions  Take over-the-counter and prescription medicines only as told by your health care provider.  Exercise regularly, as told by your health care provider. Avoid exercise starting several hours before bedtime.  Use relaxation techniques to manage stress. Ask your health care provider to suggest some techniques that may work well for you. These may include: ? Breathing exercises. ? Routines to release muscle tension. ? Visualizing peaceful scenes.  Make sure that you drive carefully. Avoid driving if you feel very sleepy.  Keep all follow-up visits as told by your health care provider. This is important. Contact a health care provider if:  You are tired throughout the day.  You have trouble in your daily routine due to sleepiness.  You continue to have sleep problems, or your sleep problems get worse. Get help right away if:  You have serious thoughts about hurting yourself or someone else. If you ever feel like you may hurt yourself or others, or have thoughts about taking your own life, get help right away. You can go to your nearest emergency department or call:  Your local emergency services (911 in the [place]  A suicide crisis helpline, such as the [place] at 661-032-1730. This is open 24 hours a day. Summary  Insomnia is a sleep disorder that makes it difficult to fall asleep or stay asleep.  Insomnia can be long-term (chronic) or short-term (acute).  Treatment for insomnia depends on the cause. Treatment may focus on treating an underlying condition that is causing insomnia.  Keep a sleep diary to help you and your health care provider figure out what could be causing your insomnia. This information is not intended to replace advice given to you by your health care  provider. Make sure you discuss any questions you have with your health care provider. Document Revised: 09/28/2020 Document Reviewed: 09/28/2020 Elsevier Patient Education  2021 [employer].

## 2021-01-11 ENCOUNTER — Encounter: Payer: Self-pay | Admitting: Family Medicine

## 2021-01-12 DIAGNOSIS — F4321 Adjustment disorder with depressed mood: Secondary | ICD-10-CM

## 2021-01-12 HISTORY — DX: Adjustment disorder with depressed mood: F43.21

## 2021-01-12 NOTE — Assessment & Plan Note (Addendum)
Tolerating Armour Thyroid, continue to monitor, labs run today, increase Armour Thyroid to 30 mg 4 days a week and 15 mg 3 days a week and recheck in 3 months

## 2021-01-12 NOTE — Progress Notes (Signed)
Subjective:    Patient ID: Angela Buck, female    DOB: February 07, 1941, 80 y.o.   MRN: [id number]  Chief Complaint  Patient presents with  . Follow-up    HPI Patient is in today for follow up on chronic medical concerns. No recent febrile illness or hospitalizations. She recently lost her long time partner to metastatic breast cancer but she has a good support system and she feels she is managing it well. Some tearful in the visit but doing well. Denies CP/palp/SOB/HA/congestion/fevers/GI or GU c/o. Taking meds as prescribed  Past Medical History:  Diagnosis Date  . Abdominal pain 03/16/2012  . 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 Chicken pox as a child  . Colonic polyp 01/05/2012  . Elevated BP 01/05/2012  . Fatigue 03/16/2012  . History of mumps   . Leukopenia 05/18/2014  . Mass of axilla 01/05/2012  . Measles as a child  . Medicare annual wellness visit, subsequent 01/05/2012   Sees Dr Collene Mares of Gastroenterology   . Neutropenia ([place]) 10/07/2016  . Osteopenia 01/05/2012  . Other and unspecified hyperlipidemia 05/18/2014  . Pain of left heel 04/07/2017  . Preventative health care 01/05/2012  . Preventative health care 10/07/2016  . Skin cancer 12/02/2009  . Thyroid disease 03/16/2012   right   . Vitamin D deficiency 04/07/2017    Past Surgical History:  Procedure Laterality Date  . biopsy of jawline  2011  . CATARACT EXTRACTION, BILATERAL     b/l  . laproscopy  1970   abdominal, endometriosis, with D&C  . right hip replacement  2003  . TONSILLECTOMY AND ADENOIDECTOMY  1949    Family History  Problem Relation Age of Onset  . Arthritis Mother   . Hypertension Mother   . Dementia Mother   . Heart attack Mother   . Glaucoma Mother   . Heart disease Mother        MI, stent in 2004  . Emphysema Father        smoker  . Hypertension Father   . Glaucoma Father   . COPD Father   . Other Paternal Grandfather        enlarged heart  . Ulcers Maternal Grandmother      Social History   Socioeconomic History  . Marital status: Divorced    Spouse name: Not on file  . Number of children: Not on file  . Years of education: Not on file  . Highest education level: Not on file  Occupational History  . Not on file  Tobacco Use  . Smoking status: Never Smoker  . Smokeless tobacco: Never Used  Substance and Sexual Activity  . Alcohol use: Yes    Comment: occasional wine  . Drug use: No  . Sexual activity: Never    Partners: Female    Comment: lives with partner, no major dietary restrictions.   Other Topics Concern  . Not on file  Social History Narrative  . Not on file   Social Determinants of Health   Financial Resource Strain: Low Risk   . Difficulty of Paying Living Expenses: Not hard at all  Food Insecurity: No Food Insecurity  . Worried About Charity fundraiser in the Last Year: Never true  . Ran Out of Food in the Last Year: Never true  Transportation Needs: No Transportation Needs  . Lack of Transportation (Medical): No  . Lack of Transportation (Non-Medical): No  Physical Activity: Not on file  Stress: Not on file  Social Connections: Not on file  Intimate Partner Violence: Not on file    Outpatient Medications Prior to Visit  Medication Sig Dispense Refill  . ARMOUR THYROID 15 MG tablet TAKE 2 TABLETS DAILY 3 DAYS A WEEK AND 1 TABLET DAILY 4 DAYS A WEEK 120 tablet 1  . Ascorbic Acid (VITAMIN C) 100 MG tablet     . Vitamin D, Cholecalciferol, 10 MCG (400 UNIT) TABS      No facility-administered medications prior to visit.    Allergies  Allergen Reactions  . Bee Venom Anaphylaxis  . Penicillins Hives    Has patient had a PCN reaction causing immediate rash, facial/tongue/throat swelling, SOB or lightheadedness with hypotension: No Has patient had a PCN reaction causing severe rash involving mucus membranes or skin necrosis: No Has patient had a PCN reaction that required hospitalization No Has patient had a PCN reaction  occurring within the last 10 years: No If all of the above answers are "NO", then may proceed with Cephalosporin use.    Review of Systems  Constitutional: Negative for fever and malaise/fatigue.  HENT: Negative for congestion.   Eyes: Negative for blurred vision.  Respiratory: Negative for shortness of breath.   Cardiovascular: Negative for chest pain, palpitations and leg swelling.  Gastrointestinal: Negative for abdominal pain, blood in stool and nausea.  Genitourinary: Negative for dysuria and frequency.  Musculoskeletal: Negative for falls.  Skin: Negative for rash.  Neurological: Negative for dizziness, loss of consciousness and headaches.  Endo/Heme/Allergies: Negative for environmental allergies.  Psychiatric/Behavioral: Negative for depression. The patient is not nervous/anxious.        Objective:    Physical Exam Vitals and nursing note reviewed.  Constitutional:      General: She is not in acute distress.    Appearance: She is well-developed and well-nourished.  HENT:     Head: Normocephalic and atraumatic.     Nose: Nose normal.  Eyes:     General:        Right eye: No discharge.        Left eye: No discharge.  Cardiovascular:     Rate and Rhythm: Normal rate and regular rhythm.     Heart sounds: No murmur heard.   Pulmonary:     Effort: Pulmonary effort is normal.     Breath sounds: Normal breath sounds.  Abdominal:     General: Bowel sounds are normal.     Palpations: Abdomen is soft.     Tenderness: There is no abdominal tenderness.  Musculoskeletal:        General: No edema.     Cervical back: Normal range of motion and neck supple.  Skin:    General: Skin is warm and dry.  Neurological:     Mental Status: She is alert and oriented to person, place, and time.  Psychiatric:        Mood and Affect: Mood and affect normal.     BP 106/60   Pulse 78   Temp 98.2 F (36.8 C)   Resp 16   Wt 144 lb (65.3 kg)   SpO2 99%   BMI 24.79 kg/m  Wt  Readings from Last 3 Encounters:  01/09/21 144 lb (65.3 kg)  06/01/20 143 lb (64.9 kg)  04/27/19 139 lb (63 kg)    Diabetic Foot Exam - Simple   No data filed    Lab Results  Component Value Date   WBC 6.5 01/09/2021   HGB 12.5  01/09/2021   HCT 37.8 01/09/2021   PLT 305.0 01/09/2021   GLUCOSE 85 01/09/2021   CHOL 194 01/09/2021   TRIG 105.0 01/09/2021   HDL 72.00 01/09/2021   LDLCALC 101 (H) 01/09/2021   ALT 16 01/09/2021   AST 22 01/09/2021   NA 138 01/09/2021   K 4.8 01/09/2021   CL 102 01/09/2021   CREATININE 0.99 01/09/2021   BUN 24 (H) 01/09/2021   CO2 33 (H) 01/09/2021   TSH 9.22 (H) 01/09/2021   HGBA1C 5.9 01/09/2021    Lab Results  Component Value Date   TSH 9.22 (H) 01/09/2021   Lab Results  Component Value Date   WBC 6.5 01/09/2021   HGB 12.5 01/09/2021   HCT 37.8 01/09/2021   MCV 91.0 01/09/2021   PLT 305.0 01/09/2021   Lab Results  Component Value Date   NA 138 01/09/2021   K 4.8 01/09/2021   CO2 33 (H) 01/09/2021   GLUCOSE 85 01/09/2021   BUN 24 (H) 01/09/2021   CREATININE 0.99 01/09/2021   BILITOT 0.4 01/09/2021   ALKPHOS 77 01/09/2021   AST 22 01/09/2021   ALT 16 01/09/2021   PROT 6.5 01/09/2021   ALBUMIN 3.9 01/09/2021   CALCIUM 9.6 01/09/2021   GFR 54.98 (L) 01/09/2021   Lab Results  Component Value Date   CHOL 194 01/09/2021   Lab Results  Component Value Date   HDL 72.00 01/09/2021   Lab Results  Component Value Date   LDLCALC 101 (H) 01/09/2021   Lab Results  Component Value Date   TRIG 105.0 01/09/2021   Lab Results  Component Value Date   CHOLHDL 3 01/09/2021   Lab Results  Component Value Date   HGBA1C 5.9 01/09/2021       Assessment & Plan:   Problem List Items Addressed This Visit    Borderline high blood pressure    Well controlled. Encouraged heart healthy diet such as the DASH diet and exercise as tolerated.       Relevant Orders   CBC (Completed)   Comprehensive metabolic panel (Completed)    Thyroid disease - Primary    Tolerating Armour Thyroid, continue to monitor, labs run today, increase Armour Thyroid to 30 mg 4 days a week and 15 mg 3 days a week and recheck in 3 months      Relevant Orders   TSH (Completed)   T4, free (Completed)   Hyperlipidemia, mild   Relevant Orders   Lipid panel (Completed)   Neutropenia (HCC)   Vitamin D deficiency    Supplement and monitor      Relevant Orders   VITAMIN D 25 Hydroxy (Vit-D Deficiency, Fractures) (Completed)   Hyperglycemia    hgba1c acceptable, minimize simple carbs. Increase exercise as tolerated.       Relevant Orders   Hemoglobin A1c (Completed)   Feeling grief    Her long term partner recently passed away after a years long battle with metastatic breast cancer. The passing was peaceful and the patient feels she has a good support system and is doing well. She will let us know if she needs further support         I am having Angela Buck maintain her Vitamin D (Cholecalciferol), vitamin C, and Armour Thyroid.  No orders of the defined types were placed in this encounter.    Penni Homans, MD

## 2021-01-12 NOTE — Assessment & Plan Note (Signed)
Supplement and monitor 

## 2021-01-12 NOTE — Assessment & Plan Note (Signed)
Well controlled. Encouraged heart healthy diet such as the DASH diet and exercise as tolerated.  

## 2021-01-12 NOTE — Assessment & Plan Note (Signed)
Her long term partner recently passed away after a years long battle with metastatic breast cancer. The passing was peaceful and the patient feels she has a good support system and is doing well. She will let us know if she needs further support

## 2021-01-12 NOTE — Assessment & Plan Note (Signed)
hgba1c acceptable, minimize simple carbs. Increase exercise as tolerated.  

## 2021-01-15 DIAGNOSIS — L570 Actinic keratosis: Secondary | ICD-10-CM | POA: Diagnosis not present

## 2021-01-15 DIAGNOSIS — D0461 Carcinoma in situ of skin of right upper limb, including shoulder: Secondary | ICD-10-CM | POA: Diagnosis not present

## 2021-01-17 DIAGNOSIS — Z1211 Encounter for screening for malignant neoplasm of colon: Secondary | ICD-10-CM | POA: Diagnosis not present

## 2021-01-17 DIAGNOSIS — Z1212 Encounter for screening for malignant neoplasm of rectum: Secondary | ICD-10-CM | POA: Diagnosis not present

## 2021-01-24 LAB — COLOGUARD: COLOGUARD: NEGATIVE

## 2021-04-17 ENCOUNTER — Telehealth: Payer: Medicare PPO | Admitting: Family Medicine

## 2021-04-26 DIAGNOSIS — H401131 Primary open-angle glaucoma, bilateral, mild stage: Secondary | ICD-10-CM | POA: Diagnosis not present

## 2021-04-26 DIAGNOSIS — H26491 Other secondary cataract, right eye: Secondary | ICD-10-CM | POA: Diagnosis not present

## 2021-04-26 DIAGNOSIS — H353131 Nonexudative age-related macular degeneration, bilateral, early dry stage: Secondary | ICD-10-CM | POA: Diagnosis not present

## 2021-06-05 DIAGNOSIS — H353131 Nonexudative age-related macular degeneration, bilateral, early dry stage: Secondary | ICD-10-CM | POA: Diagnosis not present

## 2021-06-05 DIAGNOSIS — H401131 Primary open-angle glaucoma, bilateral, mild stage: Secondary | ICD-10-CM | POA: Diagnosis not present

## 2021-06-12 ENCOUNTER — Encounter: Payer: Self-pay | Admitting: Emergency Medicine

## 2021-06-12 DIAGNOSIS — R140 Abdominal distension (gaseous): Secondary | ICD-10-CM | POA: Diagnosis not present

## 2021-06-12 DIAGNOSIS — R109 Unspecified abdominal pain: Secondary | ICD-10-CM | POA: Diagnosis not present

## 2021-06-12 DIAGNOSIS — R188 Other ascites: Secondary | ICD-10-CM | POA: Diagnosis not present

## 2021-06-12 DIAGNOSIS — R112 Nausea with vomiting, unspecified: Secondary | ICD-10-CM | POA: Diagnosis not present

## 2021-06-12 DIAGNOSIS — R1084 Generalized abdominal pain: Secondary | ICD-10-CM | POA: Diagnosis not present

## 2021-06-12 DIAGNOSIS — Z85828 Personal history of other malignant neoplasm of skin: Secondary | ICD-10-CM | POA: Insufficient documentation

## 2021-06-12 DIAGNOSIS — R197 Diarrhea, unspecified: Secondary | ICD-10-CM | POA: Insufficient documentation

## 2021-06-12 DIAGNOSIS — I10 Essential (primary) hypertension: Secondary | ICD-10-CM | POA: Diagnosis not present

## 2021-06-12 LAB — CBC
HCT: 39.2 % (ref 36.0–46.0)
Hemoglobin: 13.1 g/dL (ref 12.0–15.0)
MCH: 31 pg (ref 26.0–34.0)
MCHC: 33.4 g/dL (ref 30.0–36.0)
MCV: 92.7 fL (ref 80.0–100.0)
Platelets: 265 10*3/uL (ref 150–400)
RBC: 4.23 MIL/uL (ref 3.87–5.11)
RDW: 13.3 % (ref 11.5–15.5)
WBC: 8.3 10*3/uL (ref 4.0–10.5)
nRBC: 0 % (ref 0.0–0.2)

## 2021-06-12 LAB — URINALYSIS, COMPLETE (UACMP) WITH MICROSCOPIC
Bacteria, UA: NONE SEEN
Bilirubin Urine: NEGATIVE
Glucose, UA: NEGATIVE mg/dL
Hgb urine dipstick: NEGATIVE
Ketones, ur: NEGATIVE mg/dL
Nitrite: NEGATIVE
Protein, ur: NEGATIVE mg/dL
Specific Gravity, Urine: 1.012 (ref 1.005–1.030)
pH: 9 — ABNORMAL HIGH (ref 5.0–8.0)

## 2021-06-12 LAB — COMPREHENSIVE METABOLIC PANEL
ALT: 18 U/L (ref 0–44)
AST: 30 U/L (ref 15–41)
Albumin: 4.1 g/dL (ref 3.5–5.0)
Alkaline Phosphatase: 82 U/L (ref 38–126)
Anion gap: 13 (ref 5–15)
BUN: 26 mg/dL — ABNORMAL HIGH (ref 8–23)
CO2: 31 mmol/L (ref 22–32)
Calcium: 9.5 mg/dL (ref 8.9–10.3)
Chloride: 97 mmol/L — ABNORMAL LOW (ref 98–111)
Creatinine, Ser: 1.2 mg/dL — ABNORMAL HIGH (ref 0.44–1.00)
GFR, Estimated: 47 mL/min — ABNORMAL LOW (ref >60)
Glucose, Bld: 105 mg/dL — ABNORMAL HIGH (ref 70–99)
Potassium: 3.9 mmol/L (ref 3.5–5.1)
Sodium: 141 mmol/L (ref 135–145)
Total Bilirubin: 0.6 mg/dL (ref 0.3–1.2)
Total Protein: 7.2 g/dL (ref 6.5–8.1)

## 2021-06-12 LAB — LIPASE, BLOOD: Lipase: 48 U/L (ref 11–51)

## 2021-06-12 LAB — TROPONIN I (HIGH SENSITIVITY): Troponin I (High Sensitivity): 3 ng/L (ref <18)

## 2021-06-12 NOTE — ED Triage Notes (Signed)
Pt reports upper epigastric abd pain with continuous belching, cramping and upper abd distension post dinner at 1800. Pt denies V/D but has nausea. No hx/o GERD, cardiac or hiatal hernia. Last BM before arrival to ED with report of normal.

## 2021-06-13 ENCOUNTER — Emergency Department
Admission: EM | Admit: 2021-06-13 | Discharge: 2021-06-13 | Disposition: A | Discharge status: Home / Self Care | Payer: Medicare PPO | Attending: Emergency Medicine | Admitting: Emergency Medicine

## 2021-06-13 ENCOUNTER — Emergency Department: Payer: Medicare PPO

## 2021-06-13 DIAGNOSIS — R1084 Generalized abdominal pain: Secondary | ICD-10-CM

## 2021-06-13 DIAGNOSIS — R188 Other ascites: Secondary | ICD-10-CM

## 2021-06-13 DIAGNOSIS — R197 Diarrhea, unspecified: Secondary | ICD-10-CM

## 2021-06-13 DIAGNOSIS — R109 Unspecified abdominal pain: Secondary | ICD-10-CM | POA: Diagnosis not present

## 2021-06-13 DIAGNOSIS — R112 Nausea with vomiting, unspecified: Secondary | ICD-10-CM

## 2021-06-13 LAB — TROPONIN I (HIGH SENSITIVITY): Troponin I (High Sensitivity): 4 ng/L (ref <18)

## 2021-06-13 MED ORDER — IOHEXOL 300 MG/ML  SOLN
75.0000 mL | Freq: Once | INTRAMUSCULAR | Status: AC | PRN
  Administered 2021-06-13: 75 mL via INTRAVENOUS

## 2021-06-13 MED ORDER — ONDANSETRON 4 MG PO TBDP: 4.0000 mg | ORAL_TABLET | Freq: Three times a day (TID) | ORAL | 0 refills | Status: DC | PRN

## 2021-06-13 MED ORDER — LACTATED RINGERS IV BOLUS
1000.0000 mL | Freq: Once | INTRAVENOUS | Status: AC
  Administered 2021-06-13: 1000 mL via INTRAVENOUS

## 2021-06-13 MED ORDER — ONDANSETRON HCL 4 MG/2ML IJ SOLN
4.0000 mg | Freq: Once | INTRAMUSCULAR | Status: AC
  Administered 2021-06-13: 4 mg via INTRAVENOUS
  Filled 2021-06-13: qty 2

## 2021-06-13 NOTE — ED Notes (Signed)
croom coward- [phone]

## 2021-06-13 NOTE — Discharge Instructions (Addendum)
As we discussed the only finding on your CT scan was fluid in your belly that is called ascites.  This sometimes is caused by heart failure and other times by liver disease.  It is very important they follow-up with your primary care doctor soon as possible for further evaluation of this finding.  This is not the cause of your symptoms today.  You either have a virus causing vomiting and diarrhea or food poisoning.  Make sure to eat a bland diet for the next 24 to 48 hours.  I am providing you with a prescription for Zofran.  Take that as needed for nausea and vomiting.  Make sure to keep yourself well-hydrated.  If the pain returns, if you have a fever, chest pain or shortness of breath please return to the emergency room for further evaluation.

## 2021-06-13 NOTE — ED Notes (Signed)
Pt able to tolerate PO challenge without any difficulty. Pt assisted up to toilet and this time, pt ambulates with steady gait independently

## 2021-06-13 NOTE — ED Provider Notes (Signed)
[facility] Emergency Department Provider Note  ____________________________________________  Time seen: Approximately 4:14 AM  I have reviewed the triage vital signs and the nursing notes.   HISTORY  Chief Complaint Abdominal Pain   HPI Angela Buck is a 80 y.o. female with a history of hypertension, measles, thyroid disease who presents for evaluation of abdominal pain.  Patient reports that that her pain started after dinner around 6 PM.  She reports being very bloated with diffuse cramping abdominal pain.  She reports that she had several large episodes of nonbloody nonbilious emesis.  She reports that she feels improved right now and no longer has significant abdominal pain or vomiting.  She reports that the distention improved after vomiting.  She had 1 bowel movement in the waiting room but denies diarrhea.  She is passing flatus.  Denies any prior abdominal surgeries a history of SBO.  She did have a laparoscopy done in the 70s for endometriosis.  She denies any urinary symptoms, chest pain, shortness of breath, fever or chills.   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   Chicken pox as a child   Colonic polyp 01/05/2012   Elevated BP 01/05/2012   Fatigue 03/16/2012   History of mumps    Leukopenia 05/18/2014   Mass of axilla 01/05/2012   Measles as a child   Medicare annual wellness visit, subsequent 01/05/2012   Sees Dr Collene Mares of Gastroenterology    Neutropenia ([place]) 10/07/2016   Osteopenia 01/05/2012   Other and unspecified hyperlipidemia 05/18/2014   Pain of left heel 04/07/2017   Preventative health care 01/05/2012   Preventative health care 10/07/2016   Skin cancer 12/02/2009   Thyroid disease 03/16/2012   right    Vitamin D deficiency 04/07/2017    Patient Active Problem List   Diagnosis Date Noted   Feeling grief 01/12/2021   Hyperglycemia 04/20/2018   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   Plantar fasciitis 06/11/2017   Vitamin D deficiency 04/07/2017   Pain of left heel 04/07/2017   Preventative health care 10/07/2016   Neutropenia ([place]) 10/07/2016   Constipation by delayed colonic transit 01/27/2015   Hyperlipidemia, mild 05/18/2014   Fatigue 03/16/2012   Thyroid disease 03/16/2012   Colonic polyp 01/05/2012   Osteopenia 01/05/2012   Medicare annual wellness visit, subsequent 01/05/2012   Mass of axilla 01/05/2012   Borderline high blood pressure 01/05/2012   History of mumps    Skin cancer 12/02/2009    Past Surgical History:  Procedure Laterality Date   biopsy of jawline  2011   CATARACT EXTRACTION, BILATERAL     b/l   laproscopy  1970   abdominal, endometriosis, with D&C   right hip replacement  2003   [place]    Prior to Admission medications   Medication Sig Start Date End Date Taking? Authorizing Provider  ondansetron (ZOFRAN ODT) 4 MG disintegrating tablet Take 1 tablet (4 mg total) by mouth every 8 (eight) hours as needed. 06/13/21  Yes Alfred Levins, [place], MD  ARMOUR THYROID 15 MG tablet TAKE 2 TABLETS DAILY 3 DAYS A WEEK AND 1 TABLET DAILY 4 DAYS A WEEK 10/28/20   Mosie Lukes, MD  Ascorbic Acid (VITAMIN C) 100 MG tablet  02/07/20   [provider]  Vitamin D, Cholecalciferol, 10 MCG (400 UNIT) TABS  02/07/20   [provider]    Allergies  Bee venom and Penicillins  Family History  Problem Relation Age of Onset   Arthritis Mother    Hypertension Mother    Dementia Mother    Heart attack Mother    Glaucoma Mother    Heart disease Mother        MI, stent in 2004   Emphysema Father        smoker   Hypertension Father    Glaucoma Father    COPD Father    Other Paternal Grandfather        enlarged heart   Ulcers Maternal Grandmother     Social History Social History   Tobacco Use   Smoking status: Never   Smokeless tobacco: Never  Substance Use Topics   Alcohol use: Yes     Comment: occasional wine   Drug use: No    Review of Systems  Constitutional: Negative for fever. Eyes: Negative for visual changes. ENT: Negative for sore throat. Neck: No neck pain  Cardiovascular: Negative for chest pain. Respiratory: Negative for shortness of breath. Gastrointestinal: + abdominal pain, nausea, and vomiting. no diarrhea. Genitourinary: Negative for dysuria. Musculoskeletal: Negative for back pain. Skin: Negative for rash. Neurological: Negative for headaches, weakness or numbness. Psych: No SI or HI  ____________________________________________   PHYSICAL EXAM:  VITAL SIGNS: ED Triage Vitals [06/12/21 2242]  Enc Vitals Group     BP (!) 146/79     Pulse Rate 73     Resp 18     Temp 97.8 F (36.6 C)     Temp Source Oral     SpO2 98 %     Weight 145 lb (65.8 kg)     Height 5\' 3"  (1.6 m)     Head Circumference      Peak Flow      Pain Score      Pain Loc      Pain Edu?      Excl. in [place]?     Constitutional: Alert and oriented. Well appearing and in no apparent distress. HEENT:      Head: Normocephalic and atraumatic.         Eyes: Conjunctivae are normal. Sclera is non-icteric.       Mouth/Throat: Mucous membranes are moist.       Neck: Supple with no signs of meningismus. Cardiovascular: Regular rate and rhythm. No murmurs, gallops, or rubs. 2+ symmetrical distal pulses are present in all extremities. No JVD. Respiratory: Normal respiratory effort. Lungs are clear to auscultation bilaterally.  Gastrointestinal: Soft, mildly diffuse tenderness, and non distended with positive bowel sounds. No rebound or guarding. Genitourinary: No CVA tenderness. Musculoskeletal:  No edema, cyanosis, or erythema of extremities. Neurologic: Normal speech and language. Face is symmetric. Moving all extremities. No gross focal neurologic deficits are appreciated. Skin: Skin is warm, dry and intact. No rash noted. Psychiatric: Mood and affect are normal. Speech  and behavior are normal.  ____________________________________________   LABS (all labs ordered are listed, but only abnormal results are displayed)  Labs Reviewed  COMPREHENSIVE METABOLIC PANEL - Abnormal; Notable for the following components:      Result Value   Chloride 97 (*)    Glucose, Bld 105 (*)    BUN 26 (*)    Creatinine, Ser 1.20 (*)    GFR, Estimated 47 (*)    All other components within normal limits  URINALYSIS, COMPLETE (UACMP) WITH MICROSCOPIC - Abnormal; Notable for the following components:   Color, Urine YELLOW (*)  APPearance CLEAR (*)    pH 9.0 (*)    Leukocytes,Ua SMALL (*)    All other components within normal limits  LIPASE, BLOOD  CBC  TROPONIN I (HIGH SENSITIVITY)  TROPONIN I (HIGH SENSITIVITY)   ____________________________________________  EKG  ED ECG REPORT I, Rudene Re, the attending physician, personally viewed and interpreted this ECG.  Sinus rhythm with rate of 61, normal intervals, normal axis, no ST elevations or depressions.  Normal EKG. ____________________________________________  RADIOLOGY  I have personally reviewed the images performed during this visit and I agree with the Radiologist's read.   Interpretation by Radiologist:  CT ABDOMEN PELVIS W CONTRAST  Result Date: 06/13/2021 CLINICAL DATA:  80 year old female with history of epigastric abdominal pain. EXAM: CT ABDOMEN AND PELVIS WITH CONTRAST TECHNIQUE: Multidetector CT imaging of the abdomen and pelvis was performed using the standard protocol following bolus administration of intravenous contrast. CONTRAST:  76mL OMNIPAQUE IOHEXOL 300 MG/ML  SOLN COMPARISON:  No priors. FINDINGS: Lower chest: Aortic atherosclerosis. Hepatobiliary: No suspicious cystic or solid hepatic lesions. No intra or extrahepatic biliary ductal dilatation. Gallbladder is normal in appearance. Pancreas: No pancreatic mass. No pancreatic ductal dilatation. No pancreatic or peripancreatic fluid  collections or inflammatory changes. Spleen: Unremarkable. Adrenals/Urinary Tract: Subcentimeter low-attenuation lesions in the lower pole of the left kidney, too small to characterize, but statistically likely to represent tiny cysts. Right kidney and bilateral adrenal glands are normal in appearance. No hydroureteronephrosis. Urinary bladder is partially obscured by beam hardening artifact from patient's right hip arthroplasty, but visualized portions are within normal limits. Stomach/Bowel: The appearance of the stomach is normal. No pathologic dilatation of small bowel or colon. The appendix is not confidently identified and may be surgically absent. Regardless, there are no inflammatory changes noted adjacent to the cecum to suggest the presence of an acute appendicitis at this time. Vascular/Lymphatic: Aortic atherosclerosis, without evidence of aneurysm or dissection in the abdominal or pelvic vasculature. No lymphadenopathy noted in the abdomen or pelvis. Reproductive: Uterus and ovaries are either surgically absent or atrophic, but evaluation is limited by extensive beam hardening artifact from the patient's right hip arthroplasty. Other: Trace volume of perihepatic and perisplenic ascites, as well as a small volume of ascites in the low anatomic pelvis. No pneumoperitoneum. Musculoskeletal: There are no aggressive appearing lytic or blastic lesions noted in the visualized portions of the skeleton. Status post right hip arthroplasty. IMPRESSION: 1. Small volume of ascites, as above. This is of uncertain etiology and significance. 2. Aortic atherosclerosis. 3. Additional incidental findings, as above. Electronically Signed   By: Vinnie Langton M.D.   On: 06/13/2021 05:56     ____________________________________________   PROCEDURES  Procedure(s) performed:yes .1-3 Lead EKG Interpretation  Date/Time: 06/13/2021 4:16 AM Performed by: Rudene Re, MD Authorized by: Rudene Re, MD      Interpretation: normal     ECG rate assessment: normal     Rhythm: sinus rhythm     Ectopy: none     Conduction: normal     Critical Care performed:  None ____________________________________________   INITIAL IMPRESSION / ASSESSMENT AND PLAN / ED COURSE  80 y.o. female with a history of hypertension, measles, thyroid disease who presents for evaluation of abdominal pain, distention, nausea and vomiting.  Patient reports feeling improved after having several episodes of large nonbloody nonbilious emesis.  She has mild diffuse tenderness with no localized tenderness, rebound or guarding.  Otherwise well-appearing with normal vital signs.  Ddx SBO versus food poisoning versus gastritis  versus pancreatitis versus hiatal hernia versus GERD/indigestion versus gallbladder pathology versus kidney stone versus diverticulitis versus pyelonephritis versus volvulus versus ischemic bowel viral gastroenteritis.  Labs showing mild AKI with no significant electrolyte derangements.  Will give IV fluids and Zofran.  EKG and 2 high-sensitivity troponins with no signs of demand ischemia.  Normal LFTs and lipase, no leukocytosis.  UA with no signs of urinary tract infection.  CT abdomen pelvis pending.  Patient placed on telemetry for monitoring of cardiorespiratory status.  Old medical records reviewed.  _________________________ 6:22 AM on 06/13/2021 ----------------------------------------- CT showing no signs of SBO or any other acute abnormalities.  Her presentation most likely due to food poisoning.  She is tolerating p.o. with no further episodes of abdominal pain or vomiting.  Abdomen is soft and nontender.  CT did have an incidental finding of ascites which I did discussed with patient and recommended follow-up with her PCP for further evaluation.  Discussed my standard return precautions.  Patient is now stable for discharge home    _____________________________________________ Please note:   Patient was evaluated in Emergency Department today for the symptoms described in the history of present illness. Patient was evaluated in the context of the global COVID-19 pandemic, which necessitated consideration that the patient might be at risk for infection with the SARS-CoV-2 virus that causes COVID-19. Institutional protocols and algorithms that pertain to the evaluation of patients at risk for COVID-19 are in a state of rapid change based on information released by regulatory bodies including the CDC and federal and state organizations. These policies and algorithms were followed during the patient's care in the ED.  Some ED evaluations and interventions may be delayed as a result of limited staffing during the pandemic.   [place] Controlled Substance Database was reviewed by me. ____________________________________________   FINAL CLINICAL IMPRESSION(S) / ED DIAGNOSES   Final diagnoses:  Nausea vomiting and diarrhea  Generalized abdominal pain  Other ascites      NEW MEDICATIONS STARTED DURING THIS VISIT:  ED Discharge Orders          Ordered    ondansetron (ZOFRAN ODT) 4 MG disintegrating tablet  Every 8 hours PRN        06/13/21 0622             Note:  This document was prepared using Dragon voice recognition software and may include unintentional dictation errors.    Alfred Levins, [place], MD 06/13/21 [phone]

## 2021-06-26 ENCOUNTER — Telehealth (INDEPENDENT_AMBULATORY_CARE_PROVIDER_SITE_OTHER): Payer: Medicare PPO | Admitting: Family Medicine

## 2021-06-26 ENCOUNTER — Other Ambulatory Visit: Payer: Self-pay

## 2021-06-26 DIAGNOSIS — E559 Vitamin D deficiency, unspecified: Secondary | ICD-10-CM | POA: Diagnosis not present

## 2021-06-26 DIAGNOSIS — D709 Neutropenia, unspecified: Secondary | ICD-10-CM

## 2021-06-26 DIAGNOSIS — E079 Disorder of thyroid, unspecified: Secondary | ICD-10-CM

## 2021-06-26 DIAGNOSIS — R188 Other ascites: Secondary | ICD-10-CM

## 2021-06-26 DIAGNOSIS — R109 Unspecified abdominal pain: Secondary | ICD-10-CM

## 2021-06-26 DIAGNOSIS — R739 Hyperglycemia, unspecified: Secondary | ICD-10-CM | POA: Diagnosis not present

## 2021-06-26 NOTE — Patient Instructions (Signed)
  Paxlovid or Molnupiravir are the new COVID medications we can give you if you get COVID so make sure you test if you have symptoms because we have to treat by day 5 of symptoms for it to be effective. If you are positive let us know so we can treat. If a home test is negative and your symptoms are persistent get a PCR test. Can check testing locations at [email or web address] If you are positive we will make an appointment with us and we will send in Paxlovid if you would like it. Check with your pharmacy before we meet to confirm they have it in stock, if they do not then we can get the prescription at the [facility]  

## 2021-06-27 NOTE — Assessment & Plan Note (Signed)
No recent febrile illness

## 2021-06-27 NOTE — Assessment & Plan Note (Signed)
Supplement and monitor 

## 2021-06-27 NOTE — Progress Notes (Signed)
MyChart Video Visit    Virtual Visit via Video Note   This visit type was conducted due to national recommendations for restrictions regarding the COVID-19 Pandemic (e.g. social distancing) in an effort to limit this patient's exposure and mitigate transmission in our community. This patient is at least at moderate risk for complications without adequate follow up. This format is felt to be most appropriate for this patient at this time. Physical exam was limited by quality of the video and audio technology used for the visit. S Chism, CMA was able to get the patient set up on a video visit.  Patient location: home Patient and provider in visit Provider location: Office  I discussed the limitations of evaluation and management by telemedicine and the availability of in person appointments. The patient expressed understanding and agreed to proceed.  Visit Date: 06/26/2021  Today's healthcare provider: Penni Homans, MD     Subjective:    Patient ID: Angela Buck, female    DOB: November 13, 1941, 80 y.o.   MRN: [id number]  Chief Complaint  Patient presents with   Hospitalization Follow-up    HPI Patient is in today for emergency room follow up. She presented to there emergency room with abdominal pain and bloating. She was experiencing nausea, vomitng and diarrhea. No bloody or tarry stool. No fevers or chills and pain is much better now. Her appetite is good. Denies CP/palp/SOB/HA/congestion/fevers or GU c/o. Taking meds as prescribed   Past Medical History:  Diagnosis Date   Abdominal pain 03/16/2012   Cancer (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   Chicken pox as a child   Colonic polyp 01/05/2012   Elevated BP 01/05/2012   Fatigue 03/16/2012   History of mumps    Leukopenia 05/18/2014   Mass of axilla 01/05/2012   Measles as a child   Medicare annual wellness visit, subsequent 01/05/2012   Sees Dr Collene Mares of Gastro[facility]    Neutropenia ([place]) 10/07/2016   Osteopenia 01/05/2012    Other and unspecified hyperlipidemia 05/18/2014   Pain of left heel 04/07/2017   Preventative health care 01/05/2012   Preventative health care 10/07/2016   Skin cancer 12/02/2009   Thyroid disease 03/16/2012   right    Vitamin D deficiency 04/07/2017    Past Surgical History:  Procedure Laterality Date   biopsy of jawline  2011   CATARACT EXTRACTION, BILATERAL     b/l   laproscopy  1970   abdominal, endometriosis, with D&C   right hip replacement  2003   TONSILLECTOMY AND ADENOIDECTOMY  1949    Family History  Problem Relation Age of Onset   Arthritis Mother    Hypertension Mother    Dementia Mother    Heart attack Mother    Glaucoma Mother    Heart disease Mother        MI, stent in 2004   Emphysema Father        smoker   Hypertension Father    Glaucoma Father    COPD Father    Other Paternal Grandfather        enlarged heart   Ulcers Maternal Grandmother     Social History   Socioeconomic History   Marital status: Divorced    Spouse name: Not on file   Number of children: Not on file   Years of education: Not on file   Highest education level: Not on file  Occupational History   Not on file  Tobacco Use  Smoking status: Never   Smokeless tobacco: Never  Substance and Sexual Activity   Alcohol use: Yes    Comment: occasional wine   Drug use: No   Sexual activity: Never    Partners: Female    Comment: lives with partner, no major dietary restrictions.   Other Topics Concern   Not on file  Social History Narrative   Not on file   Social Determinants of Health   Financial Resource Strain: Not on file  Food Insecurity: Not on file  Transportation Needs: Not on file  Physical Activity: Not on file  Stress: Not on file  Social Connections: Not on file  Intimate Partner Violence: Not on file    Outpatient Medications Prior to Visit  Medication Sig Dispense Refill   ARMOUR THYROID 15 MG tablet TAKE 2 TABLETS DAILY 3 DAYS A WEEK AND 1 TABLET DAILY 4  DAYS A WEEK 120 tablet 1   Ascorbic Acid (VITAMIN C) 100 MG tablet      Vitamin D, Cholecalciferol, 10 MCG (400 UNIT) TABS      ondansetron (ZOFRAN ODT) 4 MG disintegrating tablet Take 1 tablet (4 mg total) by mouth every 8 (eight) hours as needed. 20 tablet 0   No facility-administered medications prior to visit.    Allergies  Allergen Reactions   Bee Venom Anaphylaxis   Penicillins Hives    Has patient had a PCN reaction causing immediate rash, facial/tongue/throat swelling, SOB or lightheadedness with hypotension: No Has patient had a PCN reaction causing severe rash involving mucus membranes or skin necrosis: No Has patient had a PCN reaction that required hospitalization No Has patient had a PCN reaction occurring within the last 10 years: No If all of the above answers are "NO", then may proceed with Cephalosporin use.    Review of Systems  Constitutional:  Negative for fever and malaise/fatigue.  HENT:  Negative for congestion.   Eyes:  Negative for blurred vision.  Respiratory:  Negative for shortness of breath.   Cardiovascular:  Negative for chest pain, palpitations and leg swelling.  Gastrointestinal:  Positive for abdominal pain. Negative for blood in stool, melena and nausea.  Genitourinary:  Negative for dysuria and frequency.  Musculoskeletal:  Negative for falls.  Skin:  Negative for rash.  Neurological:  Negative for dizziness, loss of consciousness and headaches.  Endo/Heme/Allergies:  Negative for environmental allergies.  Psychiatric/Behavioral:  Negative for depression. The patient is not nervous/anxious.       Objective:    Physical Exam Constitutional:      General: She is not in acute distress.    Appearance: Normal appearance. She is not ill-appearing or toxic-appearing.  HENT:     Head: Normocephalic and atraumatic.     Right Ear: External ear normal.     Left Ear: External ear normal.     Nose: Nose normal.  Eyes:     General:        Right  eye: No discharge.        Left eye: No discharge.  Pulmonary:     Effort: Pulmonary effort is normal.  Skin:    Findings: No rash.  Neurological:     Mental Status: She is alert and oriented to person, place, and time.  Psychiatric:        Behavior: Behavior normal.    There were no vitals taken for this visit. Wt Readings from Last 3 Encounters:  06/12/21 145 lb (65.8 kg)  01/09/21 144 lb (65.3 kg)  06/01/20 143 lb (64.9 kg)    Diabetic Foot Exam - Simple   No data filed    Lab Results  Component Value Date   WBC 8.3 06/12/2021   HGB 13.1 06/12/2021   HCT 39.2 06/12/2021   PLT 265 06/12/2021   GLUCOSE 105 (H) 06/12/2021   CHOL 194 01/09/2021   TRIG 105.0 01/09/2021   HDL 72.00 01/09/2021   LDLCALC 101 (H) 01/09/2021   ALT 18 06/12/2021   AST 30 06/12/2021   NA 141 06/12/2021   K 3.9 06/12/2021   CL 97 (L) 06/12/2021   CREATININE 1.20 (H) 06/12/2021   BUN 26 (H) 06/12/2021   CO2 31 06/12/2021   TSH 9.22 (H) 01/09/2021   HGBA1C 5.9 01/09/2021    Lab Results  Component Value Date   TSH 9.22 (H) 01/09/2021   Lab Results  Component Value Date   WBC 8.3 06/12/2021   HGB 13.1 06/12/2021   HCT 39.2 06/12/2021   MCV 92.7 06/12/2021   PLT 265 06/12/2021   Lab Results  Component Value Date   NA 141 06/12/2021   K 3.9 06/12/2021   CO2 31 06/12/2021   GLUCOSE 105 (H) 06/12/2021   BUN 26 (H) 06/12/2021   CREATININE 1.20 (H) 06/12/2021   BILITOT 0.6 06/12/2021   ALKPHOS 82 06/12/2021   AST 30 06/12/2021   ALT 18 06/12/2021   PROT 7.2 06/12/2021   ALBUMIN 4.1 06/12/2021   CALCIUM 9.5 06/12/2021   ANIONGAP 13 06/12/2021   GFR 54.98 (L) 01/09/2021   Lab Results  Component Value Date   CHOL 194 01/09/2021   Lab Results  Component Value Date   HDL 72.00 01/09/2021   Lab Results  Component Value Date   LDLCALC 101 (H) 01/09/2021   Lab Results  Component Value Date   TRIG 105.0 01/09/2021   Lab Results  Component Value Date   CHOLHDL 3  01/09/2021   Lab Results  Component Value Date   HGBA1C 5.9 01/09/2021       Assessment & Plan:   Problem List Items Addressed This Visit     Abdominal pain    During a recent bout of diarrhea with a little nausea and vomiting she had significant abdominal and bloating. A CT scan just showed some mild ascites but no other significant findings. Will repeat imaging with ultrasound soon to assess for resolution. Her diarrhea and nausea and vomiting have resolved. Hydrate well and continue probiotic.       Relevant Orders   US Abdomen Complete   Thyroid disease    Supplement and monitor       Neutropenia (HCC)    No recent febrile illness        Vitamin D deficiency    Supplement and monitor       Hyperglycemia    hgba1c acceptable, minimize simple carbs. Increase exercise as tolerated.        Other Visit Diagnoses     Other ascites    -  Primary   Relevant Orders   US Abdomen Complete       I have discontinued Mardene Celeste L. Sharman's ondansetron. I am also having her maintain her Vitamin D (Cholecalciferol), vitamin C, and Armour Thyroid.  No orders of the defined types were placed in this encounter.   I discussed the assessment and treatment plan with the patient. The patient was provided an opportunity to ask questions and all were answered. The patient agreed with the plan and demonstrated  an understanding of the instructions.   The patient was advised to call back or seek an in-person evaluation if the symptoms worsen or if the condition fails to improve as anticipated.  I provided 21 minutes of face-to-face time during this encounter.   Penni Homans, MD [facility] at [facility] [phone] (phone) [phone] (fax)  [place]

## 2021-06-27 NOTE — Assessment & Plan Note (Signed)
During a recent bout of diarrhea with a little nausea and vomiting she had significant abdominal and bloating. A CT scan just showed some mild ascites but no other significant findings. Will repeat imaging with ultrasound soon to assess for resolution. Her diarrhea and nausea and vomiting have resolved. Hydrate well and continue probiotic.

## 2021-06-27 NOTE — Assessment & Plan Note (Signed)
hgba1c acceptable, minimize simple carbs. Increase exercise as tolerated.  

## 2021-07-03 ENCOUNTER — Other Ambulatory Visit: Payer: Self-pay

## 2021-07-03 ENCOUNTER — Telehealth: Payer: Self-pay | Admitting: Family Medicine

## 2021-07-03 ENCOUNTER — Ambulatory Visit (HOSPITAL_BASED_OUTPATIENT_CLINIC_OR_DEPARTMENT_OTHER)
Admission: RE | Admit: 2021-07-03 | Discharge: 2021-07-03 | Disposition: A | Discharge status: Home / Self Care | Payer: Medicare PPO | Source: Ambulatory Visit | Attending: Family Medicine | Admitting: Family Medicine

## 2021-07-03 DIAGNOSIS — R109 Unspecified abdominal pain: Secondary | ICD-10-CM | POA: Diagnosis not present

## 2021-07-03 DIAGNOSIS — R188 Other ascites: Secondary | ICD-10-CM | POA: Diagnosis not present

## 2021-07-03 NOTE — Telephone Encounter (Signed)
Copied from [place] [phone]. Topic: Medicare AWV >> Jul 03, 2021  9:01 AM Harris-Coley, Hannah Beat wrote: Reason for CRM: Left message for patient to schedule Annual Wellness Visit.  Please schedule with Health Nurse Advisor Augustine Radar. at [facility].

## 2021-07-24 ENCOUNTER — Encounter: Payer: Self-pay | Admitting: Family Medicine

## 2021-07-24 ENCOUNTER — Ambulatory Visit (INDEPENDENT_AMBULATORY_CARE_PROVIDER_SITE_OTHER): Payer: Medicare PPO | Admitting: Family Medicine

## 2021-07-24 ENCOUNTER — Other Ambulatory Visit: Payer: Self-pay

## 2021-07-24 VITALS — BP 108/64 | HR 76 | Temp 97.9°F | Resp 16 | Ht 63.0 in | Wt 149.0 lb

## 2021-07-24 DIAGNOSIS — F4321 Adjustment disorder with depressed mood: Secondary | ICD-10-CM | POA: Diagnosis not present

## 2021-07-24 DIAGNOSIS — Z0000 Encounter for general adult medical examination without abnormal findings: Secondary | ICD-10-CM

## 2021-07-24 DIAGNOSIS — M8580 Other specified disorders of bone density and structure, unspecified site: Secondary | ICD-10-CM

## 2021-07-24 DIAGNOSIS — S0181XA Laceration without foreign body of other part of head, initial encounter: Secondary | ICD-10-CM

## 2021-07-24 DIAGNOSIS — Z0001 Encounter for general adult medical examination with abnormal findings: Secondary | ICD-10-CM | POA: Diagnosis not present

## 2021-07-24 DIAGNOSIS — E785 Hyperlipidemia, unspecified: Secondary | ICD-10-CM

## 2021-07-24 DIAGNOSIS — K5901 Slow transit constipation: Secondary | ICD-10-CM | POA: Diagnosis not present

## 2021-07-24 DIAGNOSIS — R739 Hyperglycemia, unspecified: Secondary | ICD-10-CM | POA: Diagnosis not present

## 2021-07-24 DIAGNOSIS — E079 Disorder of thyroid, unspecified: Secondary | ICD-10-CM

## 2021-07-24 DIAGNOSIS — Z23 Encounter for immunization: Secondary | ICD-10-CM | POA: Diagnosis not present

## 2021-07-24 DIAGNOSIS — E559 Vitamin D deficiency, unspecified: Secondary | ICD-10-CM

## 2021-07-24 LAB — TSH: TSH: 6.23 u[IU]/mL — ABNORMAL HIGH (ref 0.35–5.50)

## 2021-07-24 LAB — COMPREHENSIVE METABOLIC PANEL
ALT: 16 U/L (ref 0–35)
AST: 22 U/L (ref 0–37)
Albumin: 3.9 g/dL (ref 3.5–5.2)
Alkaline Phosphatase: 77 U/L (ref 39–117)
BUN: 21 mg/dL (ref 6–23)
CO2: 29 mEq/L (ref 19–32)
Calcium: 9.6 mg/dL (ref 8.4–10.5)
Chloride: 103 mEq/L (ref 96–112)
Creatinine, Ser: 1.05 mg/dL (ref 0.40–1.20)
GFR: 51.04 mL/min — ABNORMAL LOW (ref >60.00)
Glucose, Bld: 80 mg/dL (ref 70–99)
Potassium: 4.8 mEq/L (ref 3.5–5.1)
Sodium: 139 mEq/L (ref 135–145)
Total Bilirubin: 0.5 mg/dL (ref 0.2–1.2)
Total Protein: 6.6 g/dL (ref 6.0–8.3)

## 2021-07-24 LAB — CBC WITH DIFFERENTIAL/PLATELET
Basophils Absolute: 0.1 10*3/uL (ref 0.0–0.1)
Basophils Relative: 1.2 % (ref 0.0–3.0)
Eosinophils Absolute: 0.1 10*3/uL (ref 0.0–0.7)
Eosinophils Relative: 0.9 % (ref 0.0–5.0)
HCT: 37.1 % (ref 36.0–46.0)
Hemoglobin: 12.3 g/dL (ref 12.0–15.0)
Lymphocytes Relative: 26.3 % (ref 12.0–46.0)
Lymphs Abs: 1.4 10*3/uL (ref 0.7–4.0)
MCHC: 33.1 g/dL (ref 30.0–36.0)
MCV: 92.5 fl (ref 78.0–100.0)
Monocytes Absolute: 0.4 10*3/uL (ref 0.1–1.0)
Monocytes Relative: 7.2 % (ref 3.0–12.0)
Neutro Abs: 3.5 10*3/uL (ref 1.4–7.7)
Neutrophils Relative %: 64.4 % (ref 43.0–77.0)
Platelets: 279 10*3/uL (ref 150.0–400.0)
RBC: 4.01 Mil/uL (ref 3.87–5.11)
RDW: 14.3 % (ref 11.5–15.5)
WBC: 5.5 10*3/uL (ref 4.0–10.5)

## 2021-07-24 LAB — HEMOGLOBIN A1C: Hgb A1c MFr Bld: 5.9 % (ref 4.6–6.5)

## 2021-07-24 LAB — LIPID PANEL
Cholesterol: 192 mg/dL (ref 0–200)
HDL: 68.1 mg/dL (ref >39.00)
LDL Cholesterol: 96 mg/dL (ref 0–99)
NonHDL: 124.31
Total CHOL/HDL Ratio: 3
Triglycerides: 142 mg/dL (ref 0.0–149.0)
VLDL: 28.4 mg/dL (ref 0.0–40.0)

## 2021-07-24 NOTE — Patient Instructions (Addendum)
Ask Dr. Marina Gravel if glaucoma is open or closed on next visit  Paxlovid is the new COVID medication we can give you if you get COVID so make sure you test if you have symptoms because we have to treat by day 5 of symptoms for it to be effective. If you are positive let us know so we can treat. If a home test is negative and your symptoms are persistent get a PCR test. Can check testing locations at [email or web address] If you are positive we will make an appointment with [place] and we will send in Paxlovid if you would like it. Check with your pharmacy before we meet to confirm they have it in stock, if they do not then we can get the prescription at the [place] 78 Years and Older, Female Preventive care refers to lifestyle choices and visits with your health care provider that can promote health and wellness. This includes: A yearly physical exam. This is also called an annual wellness visit. Regular dental and eye exams. Immunizations. Screening for certain conditions. Healthy lifestyle choices, such as: Eating a healthy diet. Getting regular exercise. Not using drugs or products that contain nicotine and tobacco. Limiting alcohol use. What can I expect for my preventive care visit? Physical exam Your health care provider will check your: Height and weight. These may be used to calculate your BMI (body mass index). BMI is a measurement that tells if you are at a healthy weight. Heart rate and blood pressure. Body temperature. Skin for abnormal spots. Counseling Your health care provider may ask you questions about your: Past medical problems. Family's medical history. Alcohol, tobacco, and drug use. Emotional well-being. Home life and relationship well-being. Sexual activity. Diet, exercise, and sleep habits. History of falls. Memory and ability to understand (cognition). Work and work Statistician. Pregnancy and menstrual history. Access to  firearms. What immunizations do I need?  Vaccines are usually given at various ages, according to a schedule. Your health care provider will recommend vaccines for you based on your age, medicalhistory, and lifestyle or other factors, such as travel or where you work. What tests do I need? Blood tests Lipid and cholesterol levels. These may be checked every 5 years, or more often depending on your overall health. Hepatitis C test. Hepatitis B test. Screening Lung cancer screening. You may have this screening every year starting at age 18 if you have a 30-pack-year history of smoking and currently smoke or have quit within the past 15 years. Colorectal cancer screening. All adults should have this screening starting at age 36 and continuing until age 26. Your health care provider may recommend screening at age 29 if you are at increased risk. You will have tests every 1-10 years, depending on your results and the type of screening test. Diabetes screening. This is done by checking your blood sugar (glucose) after you have not eaten for a while (fasting). You may have this done every 1-3 years. Mammogram. This may be done every 1-2 years. Talk with your health care provider about how often you should have regular mammograms. Abdominal aortic aneurysm (AAA) screening. You may need this if you are a current or former smoker. BRCA-related cancer screening. This may be done if you have a family history of breast, ovarian, tubal, or peritoneal cancers. Other tests STD (sexually transmitted disease) testing, if you are at risk. Bone density scan. This is done to screen for osteoporosis. You may have this done starting  at age 32. Talk with your health care provider about your test results, treatment options,and if necessary, the need for more tests. Follow these instructions at home: Eating and drinking  Eat a diet that includes fresh fruits and vegetables, whole grains, lean protein, and  low-fat dairy products. Limit your intake of foods with high amounts of sugar, saturated fats, and salt. Take vitamin and mineral supplements as recommended by your health care provider. Do not drink alcohol if your health care provider tells you not to drink. If you drink alcohol: Limit how much you have to 0-1 drink a day. Be aware of how much alcohol is in your drink. In the [place] one drink equals one 12 oz bottle of beer (355 mL), one 5 oz glass of wine (148 mL), or one 1 oz glass of hard liquor (44 mL).  Lifestyle Take daily care of your teeth and gums. Brush your teeth every morning and night with fluoride toothpaste. Floss one time each day. Stay active. Exercise for at least 30 minutes 5 or more days each week. Do not use any products that contain nicotine or tobacco, such as cigarettes, e-cigarettes, and chewing tobacco. If you need help quitting, ask your health care provider. Do not use drugs. If you are sexually active, practice safe sex. Use a condom or other form of protection in order to prevent STIs (sexually transmitted infections). Talk with your health care provider about taking a low-dose aspirin or statin. Find healthy ways to cope with stress, such as: Meditation, yoga, or listening to music. Journaling. Talking to a trusted person. Spending time with friends and family. Safety Always wear your seat belt while driving or riding in a vehicle. Do not drive: If you have been drinking alcohol. Do not ride with someone who has been drinking. When you are tired or distracted. While texting. Wear a helmet and other protective equipment during sports activities. If you have firearms in your house, make sure you follow all gun safety procedures. What's next? Visit your health care provider once a year for an annual wellness visit. Ask your health care provider how often you should have your eyes and teeth checked. Stay up to date on all vaccines. This information is not  intended to replace advice given to you by your health care provider. Make sure you discuss any questions you have with your healthcare provider. Document Revised: 11/08/2020 Document Reviewed: 11/12/2018 Elsevier Patient Education  2022 [employer].

## 2021-07-24 NOTE — Progress Notes (Signed)
Patient ID: Angela Buck, female    DOB: 05-27-1941  Age: 80 y.o. MRN: [id number]    Subjective:  Subjective  HPI ANIYIAH DIPIRRO presents for office visit today for comprehensive physical exam today and follow up on management of chronic concerns. She states that she is doing ok and has found a grief counselor that she visits in [facility]. Denies CP/palp/SOB/HA/congestion/fevers/GI or GU c/o. Taking meds as prescribed.  Ran into a tree branch yesterday and got laceration on nose and bruising of left side of forehead. 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 Robyne Peers in [place].  Review of Systems  Constitutional:  Negative for chills, fatigue and fever.  HENT:  Negative for congestion, rhinorrhea, sinus pressure, sinus pain, sore throat and trouble swallowing.   Eyes:  Negative for pain.  Respiratory:  Negative for cough and shortness of breath.   Cardiovascular:  Negative for chest pain, palpitations and leg swelling.  Gastrointestinal:  Negative for abdominal pain, blood in stool, diarrhea, nausea and vomiting.  Genitourinary:  Negative for decreased urine volume, flank pain, frequency, vaginal bleeding and vaginal discharge.  Musculoskeletal:  Negative for back pain.  Skin:  Positive for wound.  Neurological:  Negative for headaches.   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   Chicken pox as a child   Colonic polyp 01/05/2012   Elevated BP 01/05/2012   Fatigue 03/16/2012   History of mumps    Leukopenia 05/18/2014   Mass of axilla 01/05/2012   Measles as a child   Medicare annual wellness visit, subsequent 01/05/2012   Sees Dr Collene Mares of Gastroenterology    Neutropenia ([place]) 10/07/2016   Osteopenia 01/05/2012   Other and unspecified hyperlipidemia 05/18/2014   Pain of left heel 04/07/2017   Preventative health care 01/05/2012   Preventative health care 10/07/2016   Skin  cancer 12/02/2009   Thyroid disease 03/16/2012   right    Vitamin D deficiency 04/07/2017    She has a past surgical history that includes right hip replacement (2003); biopsy of jawline (2011); laproscopy (1970); Tonsillectomy and adenoidectomy (1949); and Cataract extraction, bilateral.   Her family history includes Arthritis in her mother; COPD in her father; Dementia in her mother; Emphysema in her father; Glaucoma in her father and mother; Heart attack in her mother; Heart disease in her mother; Hypertension in her father and mother; Other in her paternal grandfather; Ulcers in her maternal grandmother.She reports that she has never smoked. She has never used smokeless tobacco. She reports current alcohol use. She reports that she does not use drugs.  Current Outpatient Medications on File Prior to Visit  Medication Sig Dispense Refill   Ascorbic Acid (VITAMIN C) 100 MG tablet      Vitamin D, Cholecalciferol, 10 MCG (400 UNIT) TABS      No current facility-administered medications on file prior to visit.     Objective:  Objective  Physical Exam Constitutional:      General: She is not in acute distress.    Appearance: Normal appearance. She is not ill-appearing or toxic-appearing.  HENT:     Head: Normocephalic and atraumatic.     Right Ear: Tympanic membrane, ear canal and external ear normal.     Left Ear: Tympanic membrane, ear canal and external ear normal.     Nose: No congestion or rhinorrhea.  Eyes:  Extraocular Movements: Extraocular movements intact.     Right eye: No nystagmus.     Left eye: No nystagmus.     Pupils: Pupils are equal, round, and reactive to light.  Cardiovascular:     Rate and Rhythm: Normal rate and regular rhythm.     Pulses: Normal pulses.     Heart sounds: Normal heart sounds. No murmur heard. Pulmonary:     Effort: Pulmonary effort is normal. No respiratory distress.     Breath sounds: Normal breath sounds. No wheezing, rhonchi or rales.   Abdominal:     General: Bowel sounds are normal.     Palpations: Abdomen is soft. There is no mass.     Tenderness: no abdominal tenderness There is no guarding.     Hernia: No hernia is present.  Musculoskeletal:        General: Normal range of motion.     Cervical back: Normal range of motion and neck supple.  Skin:    General: Skin is warm and dry.  Neurological:     Mental Status: She is alert and oriented to person, place, and time.     Cranial Nerves: No facial asymmetry.     Motor: Motor function is intact. No weakness.  Psychiatric:        Behavior: Behavior normal.   BP 108/64   Pulse 76   Temp 97.9 F (36.6 C)   Resp 16   Ht '5\' 3"'$  (1.6 m)   Wt 149 lb (67.6 kg)   SpO2 99%   BMI 26.39 kg/m  Wt Readings from Last 3 Encounters:  07/24/21 149 lb (67.6 kg)  06/12/21 145 lb (65.8 kg)  01/09/21 144 lb (65.3 kg)     Lab Results  Component Value Date   WBC 5.5 07/24/2021   HGB 12.3 07/24/2021   HCT 37.1 07/24/2021   PLT 279.0 07/24/2021   GLUCOSE 80 07/24/2021   CHOL 192 07/24/2021   TRIG 142.0 07/24/2021   HDL 68.10 07/24/2021   LDLCALC 96 07/24/2021   ALT 16 07/24/2021   AST 22 07/24/2021   NA 139 07/24/2021   K 4.8 07/24/2021   CL 103 07/24/2021   CREATININE 1.05 07/24/2021   BUN 21 07/24/2021   CO2 29 07/24/2021   TSH 6.23 (H) 07/24/2021   HGBA1C 5.9 07/24/2021    US Abdomen Complete  Result Date: 07/04/2021 CLINICAL DATA:  Ascites and abdominal pain. EXAM: ABDOMEN ULTRASOUND COMPLETE COMPARISON:  None. FINDINGS: Gallbladder: No gallstones or wall thickening visualized (1.5 mm). No sonographic Murphy sign noted by sonographer. Common bile duct: Diameter: 2.6 mm Liver: No focal lesion identified. Within normal limits in parenchymal echogenicity. Portal vein is patent on color Doppler imaging with normal direction of blood flow towards the liver. IVC: No abnormality visualized. Pancreas: Visualized portion unremarkable. Spleen: Size (5.9 cm) and  appearance within normal limits. Right Kidney: Length: 9.3 cm. Echogenicity within normal limits. No mass or hydronephrosis visualized. Left Kidney: Length: 9.7 cm. Echogenicity within normal limits. No mass or hydronephrosis visualized. Abdominal aorta: No aneurysm visualized (1.8 cm in AP diameter). Other findings: No ascites is visualized. IMPRESSION: Normal abdominal ultrasound. Electronically Signed   By: Virgina Norfolk M.D.   On: 07/04/2021 03:09     Assessment & Plan:  Plan    No orders of the defined types were placed in this encounter.   Problem List Items Addressed This Visit     Osteopenia    Encouraged to get adequate exercise, calcium  and vitamin d intake      Thyroid disease    Continue Armour Thyroid and monitor      Relevant Orders   TSH (Completed)   Hyperlipidemia, mild    Encourage heart healthy diet such as MIND or DASH diet, increase exercise, avoid trans fats, simple carbohydrates and processed foods, consider a krill or fish or flaxseed oil cap daily.       Relevant Orders   Comprehensive metabolic panel (Completed)   CBC with Differential/Platelet (Completed)   Lipid panel (Completed)   Constipation by delayed colonic transit    Doing much better on current regimen. No changes      Preventative health care - Primary    Patient encouraged to maintain heart healthy diet, regular exercise, adequate sleep. Consider daily probiotics. Take medications as prescribed. Labs ordered and reviewed. Tdap updated. She proceeded with 4 COVID shots but declines Pneumonia and Shingrix shots.       Vitamin D deficiency    Supplement and monitor      Relevant Orders   Vitamin D 1,25 dihydroxy   Hyperglycemia    hgba1c acceptable, minimize simple carbs. Increase exercise as tolerated.       Relevant Orders   Hemoglobin A1c (Completed)   Feeling grief    She has great support from friends and family. She is managing the passing of her long term partner with  their supports      Facial laceration    She was hit by a branchon the bridge of her nose. She cleaned it well and there is no sign of infection. She is given a Tdap today      Relevant Orders   Tdap vaccine greater than or equal to 7yo IM (Completed)   Other Visit Diagnoses     Need for Tdap vaccination       Relevant Orders   Tdap vaccine greater than or equal to 7yo IM (Completed)       Follow-up: Return in about 6 months (around 01/24/2022).  I, Suezanne Jacquet, acting as a scribe for Penni Homans, MD, have documented all relevent documentation on behalf of Penni Homans, MD, as directed by Penni Homans, MD while in the presence of Penni Homans, MD.  I, Mosie Lukes, MD personally performed the services described in this documentation. All medical record entries made by the scribe were at my direction and in my presence. I have reviewed the chart and agree that the record reflects my personal performance and is accurate and complete

## 2021-07-25 ENCOUNTER — Other Ambulatory Visit: Payer: Self-pay

## 2021-07-25 ENCOUNTER — Telehealth: Payer: Self-pay

## 2021-07-25 DIAGNOSIS — E079 Disorder of thyroid, unspecified: Secondary | ICD-10-CM

## 2021-07-25 DIAGNOSIS — S0181XA Laceration without foreign body of other part of head, initial encounter: Secondary | ICD-10-CM | POA: Insufficient documentation

## 2021-07-25 MED ORDER — ARMOUR THYROID 15 MG PO TABS: ORAL_TABLET | ORAL | 1 refills | Status: DC

## 2021-07-25 NOTE — Assessment & Plan Note (Signed)
Supplement and monitor 

## 2021-07-25 NOTE — Telephone Encounter (Signed)
Called pt to schedule lab appointment in 3 mnoths

## 2021-07-25 NOTE — Assessment & Plan Note (Signed)
She has great support from friends and family. She is managing the passing of her long term partner with their supports

## 2021-07-25 NOTE — Assessment & Plan Note (Signed)
Patient encouraged to maintain heart healthy diet, regular exercise, adequate sleep. Consider daily probiotics. Take medications as prescribed. Labs ordered and reviewed. Tdap updated. She proceeded with 4 COVID shots but declines Pneumonia and Shingrix shots.

## 2021-07-25 NOTE — Assessment & Plan Note (Signed)
Doing much better on current regimen. No changes

## 2021-07-25 NOTE — Assessment & Plan Note (Signed)
Encourage heart healthy diet such as MIND or DASH diet, increase exercise, avoid trans fats, simple carbohydrates and processed foods, consider a krill or fish or flaxseed oil cap daily.  °

## 2021-07-25 NOTE — Assessment & Plan Note (Signed)
Encouraged to get adequate exercise, calcium and vitamin d intake 

## 2021-07-25 NOTE — Assessment & Plan Note (Signed)
hgba1c acceptable, minimize simple carbs. Increase exercise as tolerated.  

## 2021-07-25 NOTE — Assessment & Plan Note (Signed)
Continue Armour Thyroid and monitor

## 2021-07-25 NOTE — Assessment & Plan Note (Signed)
She was hit by a branchon the bridge of her nose. She cleaned it well and there is no sign of infection. She is given a Tdap today

## 2021-07-27 LAB — VITAMIN D 1,25 DIHYDROXY
Vitamin D 1, 25 (OH)2 Total: 36 pg/mL (ref 18–72)
Vitamin D2 1, 25 (OH)2: <8 pg/mL
Vitamin D3 1, 25 (OH)2: 36 pg/mL

## 2021-07-31 DIAGNOSIS — L570 Actinic keratosis: Secondary | ICD-10-CM | POA: Diagnosis not present

## 2021-09-20 ENCOUNTER — Ambulatory Visit: Payer: Medicare PPO | Admitting: Family Medicine

## 2021-09-20 ENCOUNTER — Ambulatory Visit (INDEPENDENT_AMBULATORY_CARE_PROVIDER_SITE_OTHER): Payer: Medicare PPO

## 2021-09-20 ENCOUNTER — Other Ambulatory Visit: Payer: Self-pay

## 2021-09-20 VITALS — BP 126/68 | HR 62 | Temp 96.2°F | Ht 63.0 in | Wt 148.5 lb

## 2021-09-20 DIAGNOSIS — M79672 Pain in left foot: Secondary | ICD-10-CM

## 2021-09-20 DIAGNOSIS — S92332A Displaced fracture of third metatarsal bone, left foot, initial encounter for closed fracture: Secondary | ICD-10-CM | POA: Insufficient documentation

## 2021-09-20 NOTE — Progress Notes (Signed)
Subjective:     Angela Buck is a 80 y.o. female presenting for Foot Pain (Pain and swelling L x 2 weeks after exercise )     HPI  #Foot pain - Left - started 2 weeks ago - was in a class - was asked to take shoes off and stand on toes - did some pretend falling and had to catch herself - was fine in the class - but that night had some pain on the ball of the foot and the next day swelling on the top of the foot - has been icing but still painful and difficulty wearing a shoe - has been elevating as able - traveled to [place] last week  - has an infrared device at home which she has been using - worse with weight bearing - treatment: occasional ibuprofen - after long standing - ice/nsaids help -   Review of Systems   Social History   Tobacco Use  Smoking Status Never  Smokeless Tobacco Never        Objective:    BP Readings from Last 3 Encounters:  09/20/21 126/68  07/24/21 108/64  06/13/21 124/68   Wt Readings from Last 3 Encounters:  09/20/21 148 lb 8 oz (67.4 kg)  07/24/21 149 lb (67.6 kg)  06/12/21 145 lb (65.8 kg)    BP 126/68   Pulse 62   Temp (!) 96.2 F (35.7 C) (Temporal)   Ht 5\' 3"  (1.6 m)   Wt 148 lb 8 oz (67.4 kg)   SpO2 100%   BMI 26.31 kg/m    Physical Exam Constitutional:      General: She is not in acute distress.    Appearance: She is well-developed. She is not diaphoretic.  HENT:     Right Ear: External ear normal.     Left Ear: External ear normal.  Eyes:     Conjunctiva/sclera: Conjunctivae normal.  Cardiovascular:     Rate and Rhythm: Normal rate.  Pulmonary:     Effort: Pulmonary effort is normal.  Musculoskeletal:     Cervical back: Neck supple.     Comments: Left foot: Inspection: slight swelling on the forefoot, no erythema, no bruising Palpation: TTP along the 2-4th metatarsal areas dorsal, no plantar ttp, no ankle ttp ROM: normal  Strength: normal  Skin:    General: Skin is warm and dry.      Capillary Refill: Capillary refill takes less than 2 seconds.  Neurological:     Mental Status: She is alert. Mental status is at baseline.  Psychiatric:        Mood and Affect: Mood normal.        Behavior: Behavior normal.     XR: Left foot - displaced 3rd metatarsal fracture on my read     Assessment & Plan:   Problem List Items Addressed This Visit       Musculoskeletal and Integument   Closed displaced fracture of third metatarsal bone of left foot - Primary    Imaging consistent with displaced fracture. Pt notified - advised non-weight bearing, ice, nsaid/tylenol prn and ortho referral stat for ongoing care due to displaced fracture.       Relevant Orders   Ambulatory referral to Orthopedic Surgery   Other Visit Diagnoses     Left foot pain       Relevant Orders   DG Foot Complete Left (Completed)   Ambulatory referral to Orthopedic Surgery        Return  if symptoms worsen or fail to improve.  Lesleigh Noe, MD  This visit occurred during the SARS-CoV-2 public health emergency.  Safety protocols were in place, including screening questions prior to the visit, additional usage of staff PPE, and extensive cleaning of exam room while observing appropriate contact time as indicated for disinfecting solutions.

## 2021-09-20 NOTE — Assessment & Plan Note (Addendum)
Imaging consistent with displaced fracture. Pt notified - advised non-weight bearing, ice, nsaid/tylenol prn and ortho referral stat for ongoing care due to displaced fracture.

## 2021-09-20 NOTE — Patient Instructions (Signed)
Will X-Ray today to rule out fracture   Weight bearing as tolerated  Continue ice if helpful Tylenol or as needed ibuprofen Can try compression

## 2021-09-21 DIAGNOSIS — S92335A Nondisplaced fracture of third metatarsal bone, left foot, initial encounter for closed fracture: Secondary | ICD-10-CM | POA: Diagnosis not present

## 2021-10-05 DIAGNOSIS — S92335A Nondisplaced fracture of third metatarsal bone, left foot, initial encounter for closed fracture: Secondary | ICD-10-CM | POA: Diagnosis not present

## 2021-10-09 DIAGNOSIS — L814 Other melanin hyperpigmentation: Secondary | ICD-10-CM | POA: Diagnosis not present

## 2021-10-09 DIAGNOSIS — L570 Actinic keratosis: Secondary | ICD-10-CM | POA: Diagnosis not present

## 2021-10-09 DIAGNOSIS — D2271 Melanocytic nevi of right lower limb, including hip: Secondary | ICD-10-CM | POA: Diagnosis not present

## 2021-10-09 DIAGNOSIS — D485 Neoplasm of uncertain behavior of skin: Secondary | ICD-10-CM | POA: Diagnosis not present

## 2021-10-09 DIAGNOSIS — D0461 Carcinoma in situ of skin of right upper limb, including shoulder: Secondary | ICD-10-CM | POA: Diagnosis not present

## 2021-10-09 DIAGNOSIS — Z85828 Personal history of other malignant neoplasm of skin: Secondary | ICD-10-CM | POA: Diagnosis not present

## 2021-10-09 DIAGNOSIS — L578 Other skin changes due to chronic exposure to nonionizing radiation: Secondary | ICD-10-CM | POA: Diagnosis not present

## 2021-10-09 DIAGNOSIS — Z23 Encounter for immunization: Secondary | ICD-10-CM | POA: Diagnosis not present

## 2021-10-09 DIAGNOSIS — L821 Other seborrheic keratosis: Secondary | ICD-10-CM | POA: Diagnosis not present

## 2021-10-11 DIAGNOSIS — G43101 Migraine with aura, not intractable, with status migrainosus: Secondary | ICD-10-CM | POA: Diagnosis not present

## 2021-10-11 DIAGNOSIS — H353131 Nonexudative age-related macular degeneration, bilateral, early dry stage: Secondary | ICD-10-CM | POA: Diagnosis not present

## 2021-10-11 DIAGNOSIS — H401131 Primary open-angle glaucoma, bilateral, mild stage: Secondary | ICD-10-CM | POA: Diagnosis not present

## 2021-10-18 ENCOUNTER — Other Ambulatory Visit: Payer: Self-pay

## 2021-10-18 ENCOUNTER — Telehealth: Payer: Self-pay | Admitting: *Deleted

## 2021-10-18 DIAGNOSIS — E079 Disorder of thyroid, unspecified: Secondary | ICD-10-CM

## 2021-10-18 NOTE — Telephone Encounter (Signed)
Pt has lab appointment 10/29/21. There are no future orders in Epic.  Last lab results say to repeat thyroid studies in 3 months.  Please verify and place appropriate future lab orders.

## 2021-10-18 NOTE — Telephone Encounter (Signed)
Labs placed.

## 2021-10-29 ENCOUNTER — Other Ambulatory Visit (INDEPENDENT_AMBULATORY_CARE_PROVIDER_SITE_OTHER): Payer: Medicare PPO

## 2021-10-29 ENCOUNTER — Other Ambulatory Visit: Payer: Self-pay

## 2021-10-29 DIAGNOSIS — E079 Disorder of thyroid, unspecified: Secondary | ICD-10-CM | POA: Diagnosis not present

## 2021-10-29 LAB — T4, FREE: Free T4: 0.62 ng/dL (ref 0.60–1.60)

## 2021-10-29 LAB — TSH: TSH: 7.22 u[IU]/mL — ABNORMAL HIGH (ref 0.35–5.50)

## 2021-10-31 DIAGNOSIS — S92335A Nondisplaced fracture of third metatarsal bone, left foot, initial encounter for closed fracture: Secondary | ICD-10-CM | POA: Diagnosis not present

## 2021-11-01 ENCOUNTER — Ambulatory Visit (INDEPENDENT_AMBULATORY_CARE_PROVIDER_SITE_OTHER): Payer: Medicare PPO

## 2021-11-01 VITALS — Ht 63.0 in | Wt 145.0 lb

## 2021-11-01 DIAGNOSIS — Z0000 Encounter for general adult medical examination without abnormal findings: Secondary | ICD-10-CM | POA: Diagnosis not present

## 2021-11-01 NOTE — Patient Instructions (Signed)
Angela Buck , Thank you for taking time to complete your Medicare Wellness Visit. I appreciate your ongoing commitment to your health goals. Please review the following plan we discussed and let me know if I can assist you in the future.   Screening recommendations/referrals: Colonoscopy: No longer required Mammogram: Due-Per our conversation, you will completed next May with Bone Density. Bone Density: Completed 04/24/2020-Due 04/24/2022 Recommended yearly ophthalmology/optometry visit for glaucoma screening and checkup Recommended yearly dental visit for hygiene and checkup  Vaccinations: Influenza vaccine: Declined Pneumococcal vaccine: Due-May obtain vaccine at our office or your local pharmacy. Tdap vaccine: Up to date Shingles vaccine: Discuss with pharmacy   Covid-19:Up to date  Advanced directives: Copy in chart  Conditions/risks identified: See problem list  Next appointment: Follow up in one year for your annual wellness visit    Preventive Care 78 Years and Older, Female Preventive care refers to lifestyle choices and visits with your health care provider that can promote health and wellness. What does preventive care include? A yearly physical exam. This is also called an annual well check. Dental exams once or twice a year. Routine eye exams. Ask your health care provider how often you should have your eyes checked. Personal lifestyle choices, including: Daily care of your teeth and gums. Regular physical activity. Eating a healthy diet. Avoiding tobacco and drug use. Limiting alcohol use. Practicing safe sex. Taking low-dose aspirin every day. Taking vitamin and mineral supplements as recommended by your health care provider. What happens during an annual well check? The services and screenings done by your health care provider during your annual well check will depend on your age, overall health, lifestyle risk factors, and family history of disease. Counseling   Your health care provider may ask you questions about your: Alcohol use. Tobacco use. Drug use. Emotional well-being. Home and relationship well-being. Sexual activity. Eating habits. History of falls. Memory and ability to understand (cognition). Work and work Statistician. Reproductive health. Screening  You may have the following tests or measurements: Height, weight, and BMI. Blood pressure. Lipid and cholesterol levels. These may be checked every 5 years, or more frequently if you are over 78 years old. Skin check. Lung cancer screening. You may have this screening every year starting at age 78 if you have a 30-pack-year history of smoking and currently smoke or have quit within the past 15 years. Fecal occult blood test (FOBT) of the stool. You may have this test every year starting at age 78. Flexible sigmoidoscopy or colonoscopy. You may have a sigmoidoscopy every 5 years or a colonoscopy every 10 years starting at age 78. Hepatitis C blood test. Hepatitis B blood test. Sexually transmitted disease (STD) testing. Diabetes screening. This is done by checking your blood sugar (glucose) after you have not eaten for a while (fasting). You may have this done every 1-3 years. Bone density scan. This is done to screen for osteoporosis. You may have this done starting at age 78. Mammogram. This may be done every 1-2 years. Talk to your health care provider about how often you should have regular mammograms. Talk with your health care provider about your test results, treatment options, and if necessary, the need for more tests. Vaccines  Your health care provider may recommend certain vaccines, such as: Influenza vaccine. This is recommended every year. Tetanus, diphtheria, and acellular pertussis (Tdap, Td) vaccine. You may need a Td booster every 10 years. Zoster vaccine. You may need this after age 70. Pneumococcal 13-valent conjugate (  PCV13) vaccine. One dose is recommended  after age 78. Pneumococcal polysaccharide (PPSV23) vaccine. One dose is recommended after age 78. Talk to your health care provider about which screenings and vaccines you need and how often you need them. This information is not intended to replace advice given to you by your health care provider. Make sure you discuss any questions you have with your health care provider. Document Released: 12/15/2015 Document Revised: 08/07/2016 Document Reviewed: 09/19/2015 Elsevier Interactive Patient Education  2017 [place] Prevention in the Home Falls can cause injuries. They can happen to people of all ages. There are many things you can do to make your home safe and to help prevent falls. What can I do on the outside of my home? Regularly fix the edges of walkways and driveways and fix any cracks. Remove anything that might make you trip as you walk through a door, such as a raised step or threshold. Trim any bushes or trees on the path to your home. Use bright outdoor lighting. Clear any walking paths of anything that might make someone trip, such as rocks or tools. Regularly check to see if handrails are loose or broken. Make sure that both sides of any steps have handrails. Any raised decks and porches should have guardrails on the edges. Have any leaves, snow, or ice cleared regularly. Use sand or salt on walking paths during winter. Clean up any spills in your garage right away. This includes oil or grease spills. What can I do in the bathroom? Use night lights. Install grab bars by the toilet and in the tub and shower. Do not use towel bars as grab bars. Use non-skid mats or decals in the tub or shower. If you need to sit down in the shower, use a plastic, non-slip stool. Keep the floor dry. Clean up any water that spills on the floor as soon as it happens. Remove soap buildup in the tub or shower regularly. Attach bath mats securely with double-sided non-slip rug tape. Do not  have throw rugs and other things on the floor that can make you trip. What can I do in the bedroom? Use night lights. Make sure that you have a light by your bed that is easy to reach. Do not use any sheets or blankets that are too big for your bed. They should not hang down onto the floor. Have a firm chair that has side arms. You can use this for support while you get dressed. Do not have throw rugs and other things on the floor that can make you trip. What can I do in the kitchen? Clean up any spills right away. Avoid walking on wet floors. Keep items that you use a lot in easy-to-reach places. If you need to reach something above you, use a strong step stool that has a grab bar. Keep electrical cords out of the way. Do not use floor polish or wax that makes floors slippery. If you must use wax, use non-skid floor wax. Do not have throw rugs and other things on the floor that can make you trip. What can I do with my stairs? Do not leave any items on the stairs. Make sure that there are handrails on both sides of the stairs and use them. Fix handrails that are broken or loose. Make sure that handrails are as long as the stairways. Check any carpeting to make sure that it is firmly attached to the stairs. Fix any carpet that is  loose or worn. Avoid having throw rugs at the top or bottom of the stairs. If you do have throw rugs, attach them to the floor with carpet tape. Make sure that you have a light switch at the top of the stairs and the bottom of the stairs. If you do not have them, ask someone to add them for you. What else can I do to help prevent falls? Wear shoes that: Do not have high heels. Have rubber bottoms. Are comfortable and fit you well. Are closed at the toe. Do not wear sandals. If you use a stepladder: Make sure that it is fully opened. Do not climb a closed stepladder. Make sure that both sides of the stepladder are locked into place. Ask someone to hold it for  you, if possible. Clearly mark and make sure that you can see: Any grab bars or handrails. First and last steps. Where the edge of each step is. Use tools that help you move around (mobility aids) if they are needed. These include: Canes. Walkers. Scooters. Crutches. Turn on the lights when you go into a dark area. Replace any light bulbs as soon as they burn out. Set up your furniture so you have a clear path. Avoid moving your furniture around. If any of your floors are uneven, fix them. If there are any pets around you, be aware of where they are. Review your medicines with your doctor. Some medicines can make you feel dizzy. This can increase your chance of falling. Ask your doctor what other things that you can do to help prevent falls. This information is not intended to replace advice given to you by your health care provider. Make sure you discuss any questions you have with your health care provider. Document Released: 09/14/2009 Document Revised: 04/25/2016 Document Reviewed: 12/23/2014 Elsevier Interactive Patient Education  2017 [employer].

## 2021-11-01 NOTE — Progress Notes (Signed)
Subjective:   Angela Buck is a 80 y.o. female who presents for Medicare Annual (Subsequent) preventive examination.   I connected with Briella today by telephone and verified that I am speaking with the correct person using two identifiers. Location patient: home Location provider: work Persons participating in the virtual visit: patient, Marine scientist.    I discussed the limitations, risks, security and privacy concerns of performing an evaluation and management service by telephone and the availability of in person appointments. I also discussed with the patient that there may be a patient responsible charge related to this service. The patient expressed understanding and verbally consented to this telephonic visit.    Interactive audio and video telecommunications were attempted between this provider and patient, however failed, due to patient having technical difficulties OR patient did not have access to video capability.  We continued and completed visit with audio only.  Some vital signs may be absent or patient reported.   Time Spent with patient on telephone encounter: 30 minutes  Review of Systems     Cardiac Risk Factors include: advanced age (>55men, >72 women);dyslipidemia     Objective:    Today's Vitals   11/01/21 1501  Weight: 145 lb (65.8 kg)  Height: 5\' 3"  (1.6 m)   Body mass index is 25.69 kg/m.  Advanced Directives 11/01/2021 04/18/2020 04/12/2019 04/09/2018 04/07/2017 04/07/2016 09/29/2015  Does Patient Have a Medical Advance Directive? Yes Yes Yes Yes Yes Yes Yes  Type of Paramedic of [place];Living will [place];Living will [place];Living will [place];Living will [place];Living will [place];Living will [place];Living will  Does patient want to make changes to medical advance directive? - No - Patient declined No -  Patient declined - - - -  Copy of [place] in Chart? Yes - validated most recent copy scanned in chart (See row information) No - copy requested No - copy requested Yes No - copy requested No - copy requested Yes    Current Medications (verified) Outpatient Encounter Medications as of 11/01/2021  Medication Sig   ARMOUR THYROID 15 MG tablet TAKE 2 TABLETS DAILY 4 DAYS A WEEK AND 1 TABLET DAILY 3 DAYS A WEEK   Ascorbic Acid (VITAMIN C) 100 MG tablet    latanoprost (XALATAN) 0.005 % ophthalmic solution Place 1 drop into both eyes at bedtime.   Vitamin D, Cholecalciferol, 10 MCG (400 UNIT) TABS    No facility-administered encounter medications on file as of 11/01/2021.    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   Chicken pox as a child   Colonic polyp 01/05/2012   Elevated BP 01/05/2012   Fatigue 03/16/2012   History of mumps    Leukopenia 05/18/2014   Mass of axilla 01/05/2012   Measles as a child   Medicare annual wellness visit, subsequent 01/05/2012   Sees Dr Collene Mares of Gastroenterology    Neutropenia ([place]) 10/07/2016   Osteopenia 01/05/2012   Other and unspecified hyperlipidemia 05/18/2014   Pain of left heel 04/07/2017   Preventative health care 01/05/2012   Preventative health care 10/07/2016   Skin cancer 12/02/2009   Thyroid disease 03/16/2012   right    Vitamin D deficiency 04/07/2017   Past Surgical History:  Procedure Laterality Date  biopsy of jawline  2011   CATARACT EXTRACTION, BILATERAL     b/l   laproscopy  1970   abdominal, endometriosis, with D&C   right hip replacement  2003   TONSILLECTOMY AND ADENOIDECTOMY  1949   Family History  Problem Relation Age of Onset   Arthritis Mother    Hypertension Mother    Dementia Mother    Heart attack Mother    Glaucoma Mother    Heart disease Mother        MI, stent in 2004    Emphysema Father        smoker   Hypertension Father    Glaucoma Father    COPD Father    Ulcers Maternal Grandmother    Other Paternal Grandfather        enlarged heart   Social History   Socioeconomic History   Marital status: Divorced    Spouse name: Not on file   Number of children: Not on file   Years of education: Not on file   Highest education level: Not on file  Occupational History   Not on file  Tobacco Use   Smoking status: Never   Smokeless tobacco: Never  Substance and Sexual Activity   Alcohol use: Yes    Comment: occasional wine   Drug use: No   Sexual activity: Never    Partners: Female    Comment: lives with partner, no major dietary restrictions.   Other Topics Concern   Not on file  Social History Narrative   Not on file   Social Determinants of Health   Financial Resource Strain: Low Risk    Difficulty of Paying Living Expenses: Not hard at all  Food Insecurity: No Food Insecurity   Worried About Charity fundraiser in the Last Year: Never true   [place] in the Last Year: Never true  Transportation Needs: No Transportation Needs   Lack of Transportation (Medical): No   Lack of Transportation (Non-Medical): No  Physical Activity: Insufficiently Active   Days of Exercise per Week: 2 days   Minutes of Exercise per Session: 40 min  Stress: No Stress Concern Present   Feeling of Stress : Only a little  Social Connections: Moderately Integrated   Frequency of Communication with Friends and Family: More than three times a week   Frequency of Social Gatherings with Friends and Family: More than three times a week   Attends Religious Services: More than 4 times per year   Active Member of Clubs or Organizations: No   Attends Music therapist: More than 4 times per year   Marital Status: Divorced    Tobacco Counseling Counseling given: Not Answered   Clinical Intake:  Pre-visit preparation completed: Yes  Pain :  No/denies pain     BMI - recorded: 25.69 Nutritional Status: BMI 25 -29 Overweight Nutritional Risks: None Diabetes: No  How often do you need to have someone help you when you read instructions, pamphlets, or other written materials from your doctor or pharmacy?: 1 - Never  Diabetic?No  Interpreter Needed?: No  Information entered by :: Caroleen Hamman LPN   Activities of Daily Living In your present state of health, do you have any difficulty performing the following activities: 11/01/2021 07/24/2021  Hearing? Y N  Comment hearing aids -  Vision? N N  Difficulty concentrating or making decisions? N N  Walking or climbing stairs? N N  Dressing or bathing? N N  Doing  errands, shopping? N N  Preparing Food and eating ? N -  Using the Toilet? N -  In the past six months, have you accidently leaked urine? N -  Do you have problems with loss of bowel control? N -  Managing your Medications? N -  Managing your Finances? N -  Housekeeping or managing your Housekeeping? N -  Some recent data might be hidden    Patient Care Team: Mosie Lukes, MD as PCP - General (Family Medicine) Mosie Lukes, MD (Family Medicine) Jari Pigg, MD as Consulting Physician (Dermatology) Robyne Peers as Referring Physician (Ophthalmology)  Indicate any recent Medical Services you may have received from other than [facility] providers in the past year (date may be approximate).     Assessment:   This is a routine wellness examination for [place].  Hearing/Vision screen Hearing Screening - Comments:: Has hearing aids but does not usually wear them Vision Screening - Comments:: Last eye exam-10/2021-Dr. Marina Gravel  Dietary issues and exercise activities discussed: Current Exercise Habits: Home exercise routine, Type of exercise: strength training/weights, Time (Minutes): 40, Frequency (Times/Week): 2, Weekly Exercise (Minutes/Week): 80, Intensity: Mild, Exercise limited by: None identified    Goals Addressed             This Visit's Progress    Continue being active.   On track    Patient Stated       Increase water intake       Depression Screen PHQ 2/9 Scores 11/01/2021 07/24/2021 04/18/2020 04/12/2019 04/09/2018 04/07/2017 10/07/2016  PHQ - 2 Score 1 0 0 0 0 0 0    Fall Risk Fall Risk  11/01/2021 07/24/2021 04/18/2020 04/12/2019 04/09/2018  Falls in the past year? 0 0 0 0 No  Number falls in past yr: 0 0 0 - -  Injury with Fall? 0 0 0 - -  Risk for fall due to : - No Fall Risks - - -  Follow up Falls prevention discussed Falls evaluation completed Education provided;Falls prevention discussed - -    FALL RISK PREVENTION PERTAINING TO THE HOME:  Any stairs in or around the home? Yes  If so, are there any without handrails? No  Home free of loose throw rugs in walkways, pet beds, electrical cords, etc? Yes  Adequate lighting in your home to reduce risk of falls? Yes   ASSISTIVE DEVICES UTILIZED TO PREVENT FALLS:  Life alert? No  Use of a cane, walker or w/c? No  Grab bars in the bathroom? Yes  Shower chair or bench in shower? Yes  Elevated toilet seat or a handicapped toilet? Yes   TIMED UP AND GO:  Was the test performed? No . Phone visit   Cognitive Function:Normal cognitive status assessed by  this Nurse Health Advisor. No abnormalities found.          Immunizations Immunization History  Administered Date(s) Administered   [employer] Vaccination 12/20/2019, 01/17/2020, 10/13/2020, 04/19/2021   Pfizer Covid-19 Vaccine Bivalent Booster 49yrs & up 08/23/2021   Tdap 07/24/2021    TDAP status: Up to date  Flu Vaccine status: Declined, Education has been provided regarding the importance of this vaccine but patient still declined. Advised may receive this vaccine at local pharmacy or Health Dept. Aware to provide a copy of the vaccination record if obtained from local pharmacy or Health Dept. Verbalized acceptance and understanding.  Pneumococcal  vaccine status: Due, Education has been provided regarding the importance of this vaccine. Advised may receive this vaccine  at local pharmacy or Health Dept. Aware to provide a copy of the vaccination record if obtained from local pharmacy or Health Dept. Verbalized acceptance and understanding.  Covid-19 vaccine status: Completed vaccines  Qualifies for Shingles Vaccine? Yes   Zostavax completed No   Shingrix Completed?: No.    Education has been provided regarding the importance of this vaccine. Patient has been advised to call insurance company to determine out of pocket expense if they have not yet received this vaccine. Advised may also receive vaccine at local pharmacy or Health Dept. Verbalized acceptance and understanding.  Screening Tests Health Maintenance  Topic Date Due   Pneumonia Vaccine 45+ Years old (1 - PCV) Never done   DEXA SCAN  Completed   COVID-19 Vaccine  Completed   HPV VACCINES  Aged Out   INFLUENZA VACCINE  Discontinued   COLONOSCOPY (Pts 45-64yrs Insurance coverage will need to be confirmed)  Discontinued   TETANUS/TDAP  Discontinued   Hepatitis C Screening  Discontinued   Zoster Vaccines- Shingrix  Discontinued    Health Maintenance  Health Maintenance Due  Topic Date Due   Pneumonia Vaccine 77+ Years old (1 - PCV) Never done    Colorectal cancer screening: No longer required.   Mammogram status: Declined toda-Patient plans to completed in May with Bone Density.  Bone Density status: Completed 04/24/2020. Results reflect: Bone density results: OSTEOPOROSIS. Repeat every 2 years.  Lung Cancer Screening: (Low Dose CT Chest recommended if Age 80-80 years, 30 pack-year currently smoking OR have quit w/in 15years.) does not qualify.     Additional Screening:  Hepatitis C Screening: does not qualify  Vision Screening: Recommended annual ophthalmology exams for early detection of glaucoma and other disorders of the eye. Is the patient up to date with  their annual eye exam?  Yes  Who is the provider or what is the name of the office in which the patient attends annual eye exams? Dr. Marina Gravel   Dental Screening: Recommended annual dental exams for proper oral hygiene  Community Resource Referral / Chronic Care Management: CRR required this visit?  No   CCM required this visit?  No      Plan:     I have personally reviewed and noted the following in the patient's chart:   Medical and social history Use of alcohol, tobacco or illicit drugs  Current medications and supplements including opioid prescriptions.  Functional ability and status Nutritional status Physical activity Advanced directives List of other physicians Hospitalizations, surgeries, and ER visits in previous 12 months Vitals Screenings to include cognitive, depression, and falls Referrals and appointments  In addition, I have reviewed and discussed with patient certain preventive protocols, quality metrics, and best practice recommendations. A written personalized care plan for preventive services as well as general preventive health recommendations were provided to patient.   Due to this being a telephonic visit, the after visit summary with patients personalized plan was offered to patient via mail or my-chart. Patient would like to access on my-chart.   Marta Antu, LPN   88/04/276  Nurse Health Advisor  Nurse Notes: None

## 2021-11-17 ENCOUNTER — Encounter: Payer: Self-pay | Admitting: Family Medicine

## 2021-12-19 ENCOUNTER — Encounter: Payer: Self-pay | Admitting: Family Medicine

## 2021-12-20 ENCOUNTER — Encounter: Payer: Self-pay | Admitting: Family Medicine

## 2021-12-20 ENCOUNTER — Telehealth (INDEPENDENT_AMBULATORY_CARE_PROVIDER_SITE_OTHER): Payer: Medicare PPO | Admitting: Family Medicine

## 2021-12-20 VITALS — Ht 64.0 in | Wt 142.0 lb

## 2021-12-20 DIAGNOSIS — U071 COVID-19: Secondary | ICD-10-CM

## 2021-12-20 MED ORDER — MOLNUPIRAVIR EUA 200MG CAPSULE: 4.0000 | ORAL_CAPSULE | Freq: Two times a day (BID) | ORAL | 0 refills | Status: AC

## 2021-12-20 NOTE — Telephone Encounter (Signed)
Patient scheduled for today for virtual visit.

## 2021-12-20 NOTE — Patient Instructions (Addendum)
Molnupiravir patient fact sheet: RunningShows.fr   Over the counter medications that may be helpful for symptoms:  Guaifenesin 1200 mg extended release tabs twice daily, with plenty of water For cough and congestion Brand name: Mucinex   Pseudoephedrine 30 mg, one or two tabs every 4 to 6 hours For sinus congestion Brand name: Sudafed You must get this from the pharmacy counter.  Oxymetazoline nasal spray each morning, one spray in each nostril, for NO MORE THAN 3 days  For nasal and sinus congestion Brand name: Afrin Saline nasal spray or Saline Nasal Irrigation 3-5 times a day For nasal and sinus congestion Brand names: Ocean or AYR Fluticasone nasal spray, one spray in each nostril, each morning (after oxymetazoline and saline, if used) For nasal and sinus congestion Brand name: Flonase Warm salt water gargles  For sore throat Every few hours as needed Alternate ibuprofen 400-600 mg and acetaminophen 1000 mg every 4-6 hours For fever, body aches, headache Brand names: Motrin or Advil and Tylenol Dextromethorphan 12-hour cough version 30 mg every 12 hours  For cough Brand name: Delsym Stop all other cold medications for now (Nyquil, Dayquil, Tylenol Cold, Theraflu, etc) and other non-prescription cough/cold preparations. Many of these have the same ingredients listed above and could cause an overdose of medication.   Herbal treatments that have been shown to be helpful in some patients include: Vitamin C 1000mg  per day Vitamin D 4000iU per day Zinc 100mg  per day Quercetin 25-500mg  twice a day Melatonin 5-10mg  at bedtime  General Instructions Allow your body to rest Drink PLENTY of fluids Isolate yourself from everyone, even family, until test results have returned  If your COVID-19 test is positive Then you ARE INFECTED and you can pass the virus to others You must quarantine from others for a minimum of  5 days since symptoms started  AND You are fever free for 24 hours WITHOUT any medication to reduce fever AND Your symptoms are improving Wear mask for the next 5 days If not improved by day 5, quarantine for the full 10 days. Do not go to the store or other public areas Do not go around household members who are not known to be infected with COVID-19 If you MUST leave your area of quarantine (example: go to a bathroom you share with others in your home), you must Wear a mask Wash your hands thoroughly Wipe down any surfaces you touch Do not share food, drinks, towels, or other items with other persons Dispose of your own tissues, food containers, etc  Once you have recovered, please continue good preventive care measures, including:  wearing a mask when in public wash your hands frequently avoid touching your face/nose/eyes cover coughs/sneezes with the inside of your elbow stay out of crowds keep a 6 foot distance from others  If you develop severe shortness of breath, uncontrolled fevers, coughing up blood, confusion, chest pain, or signs of dehydration (such as significantly decreased urine amounts or dizziness with standing) please go to the ER.

## 2021-12-20 NOTE — Progress Notes (Signed)
Virtual Visit via Telephone Note  I connected with  Angela Buck on 12/20/21 at  1:40 PM EST by telephone and verified that I am speaking with the correct person using two identifiers.   I discussed the limitations, risks, security and privacy concerns of performing an evaluation and management service by telephone and the availability of in person appointments. I also discussed with the patient that there may be a patient responsible charge related to this service. The patient expressed understanding and agreed to proceed.  Participating parties included in this telephone visit include: The patient and the nurse practitioner listed.  The patient is: At home I am: at [facility] at [facility]   Subjective:    CC: COVID+  HPI: Angela Buck is a 81 y.o. year old female presenting today via telephone visit to discuss COVID infection.  Patient reports she flew to [place] this weekend to spend time with family.  When she got home on Monday she started with a sore throat.  By Tuesday she felt a little worse but had a negative home COVID test.  She continued to develop more symptoms and repeated testing today which was positive for COVID.  Current symptoms include cough, sore throat, chills, body aches, sinus pressure, loose stools, poor appetite.  She denies any chest pain, dyspnea, wheezing, loss of taste or smell, urinary concerns.  States she is trying to stay well-hydrated.  She feels like she is doing moderately well overall.   Past medical history, Surgical history, Family history not pertinant except as noted below, Social history, Allergies, and medications have been entered into the medical record, reviewed, and corrections made.   Review of Systems:  All review of systems negative except what is listed in the HPI  Objective:    General:  Patient speaking clearly in complete sentences. No shortness of breath noted.   Alert and oriented x3.   Normal  judgment.  No apparent acute distress.  Impression and Recommendations:    1. COVID-19 - molnupiravir EUA (LAGEVRIO) 200 mg CAPS capsule; Take 4 capsules (800 mg total) by mouth 2 (two) times daily for 5 days.  Dispense: 40 capsule; Refill: 0  Given her age, adding molnupiravir.  Attaching link to patient education sheet for her to review prior to taking, also briefly discussed the medication with her.  Discussed continuing supportive measures including Flonase, Mucinex,  rest, hydration, humidifier use, steam showers, warm compresses to sinuses, warm liquids with lemon and honey, and over-the-counter cough, cold, and analgesics as needed. Patient aware of signs/symptoms requiring further/urgent evaluation.  Denies eating anything prescription strength for cough.   Follow-up if symptoms worsen or fail to improve.      I discussed the assessment and treatment plan with the patient. The patient was provided an opportunity to ask questions and all were answered. The patient agreed with the plan and demonstrated an understanding of the instructions.   The patient was advised to call back or seek an in-person evaluation if the symptoms worsen or if the condition fails to improve as anticipated.  I provided 20 minutes of non-face-to-face time during this TELEPHONE encounter.    Terrilyn Saver, NP

## 2022-01-10 DIAGNOSIS — L570 Actinic keratosis: Secondary | ICD-10-CM | POA: Diagnosis not present

## 2022-01-27 ENCOUNTER — Other Ambulatory Visit: Payer: Self-pay | Admitting: Family Medicine

## 2022-01-27 DIAGNOSIS — E079 Disorder of thyroid, unspecified: Secondary | ICD-10-CM

## 2022-01-29 ENCOUNTER — Ambulatory Visit: Payer: Medicare PPO | Admitting: Family Medicine

## 2022-02-07 DIAGNOSIS — H0231 Blepharochalasis right upper eyelid: Secondary | ICD-10-CM | POA: Diagnosis not present

## 2022-02-07 DIAGNOSIS — H0234 Blepharochalasis left upper eyelid: Secondary | ICD-10-CM | POA: Diagnosis not present

## 2022-02-07 DIAGNOSIS — H401131 Primary open-angle glaucoma, bilateral, mild stage: Secondary | ICD-10-CM | POA: Diagnosis not present

## 2022-03-12 ENCOUNTER — Encounter: Payer: Self-pay | Admitting: Family Medicine

## 2022-03-12 ENCOUNTER — Ambulatory Visit: Payer: Medicare PPO | Admitting: Family Medicine

## 2022-03-12 VITALS — BP 108/60 | HR 64 | Temp 97.6°F | Ht 63.25 in | Wt 146.2 lb

## 2022-03-12 DIAGNOSIS — M810 Age-related osteoporosis without current pathological fracture: Secondary | ICD-10-CM | POA: Diagnosis not present

## 2022-03-12 DIAGNOSIS — M79671 Pain in right foot: Secondary | ICD-10-CM

## 2022-03-12 DIAGNOSIS — Z1231 Encounter for screening mammogram for malignant neoplasm of breast: Secondary | ICD-10-CM

## 2022-03-12 DIAGNOSIS — E079 Disorder of thyroid, unspecified: Secondary | ICD-10-CM

## 2022-03-12 DIAGNOSIS — A6000 Herpesviral infection of urogenital system, unspecified: Secondary | ICD-10-CM | POA: Diagnosis not present

## 2022-03-12 DIAGNOSIS — M79672 Pain in left foot: Secondary | ICD-10-CM

## 2022-03-12 LAB — TSH: TSH: 6.61 u[IU]/mL — ABNORMAL HIGH (ref 0.35–5.50)

## 2022-03-12 NOTE — Patient Instructions (Addendum)
Please call the location of your choice from the menu below to schedule your Mammogram and/or Bone Density appointment.   ? ? ?[place] ? ?[place] at [facility]   ?Phone:  [phone]   ?[place], [place] [id number]                                            ?Services: 3D Mammogram and Bone Density ? ?Topical Voltaren gel as needed for foot pain ?- rest as needed ? ?If bone density worse - may consider endocrinology referral ?

## 2022-03-12 NOTE — Assessment & Plan Note (Signed)
Feels stable. On armour thyroid. Last TSH slightly elevated - will recheck.  ?

## 2022-03-12 NOTE — Progress Notes (Signed)
? ?Subjective:  ? ?  ?Angela Buck is a 81 y.o. female presenting for Transitions Of Care (Would like her thyroid checked) ?  ? ? ?HPI ? ?#thyroid disease ?- on armour thyroid ?- saw natural path - not going back ?- no recent medication changes ?- dealing with a lot this ? ? ?#fall ?- was visiting a friend ?- fell on her right arm ?- back to yoga and pickle ball ?- feeling fine ? ?#osteoporosis ?- doing holistic and vitamins ?-did not like boniva ? ?#leg cramping/ankle pain ?- taking magnesium citrate at night ?- does not drinking  ?- bilateral ?- pain worse with rest after activity and resting at night - throbbing sensation  ?- active - yoga, hiking, pickle ball ? ? ?Review of Systems ? ? ?Social History  ? ?Tobacco Use  ?Smoking Status Never  ?Smokeless Tobacco Never  ? ? ? ?   ?Objective:  ?  ?BP Readings from Last 3 Encounters:  ?03/12/22 108/60  ?09/20/21 126/68  ?07/24/21 108/64  ? ?Wt Readings from Last 3 Encounters:  ?03/12/22 146 lb 4 oz (66.3 kg)  ?12/20/21 142 lb (64.4 kg)  ?11/01/21 145 lb (65.8 kg)  ? ? ?BP 108/60   Pulse 64   Temp 97.6 ?F (36.4 ?C) (Oral)   Ht 5' 3.25" (1.607 m)   Wt 146 lb 4 oz (66.3 kg)   SpO2 100%   BMI 25.70 kg/m?  ? ? ?Physical Exam ?Constitutional:   ?   General: She is not in acute distress. ?   Appearance: She is well-developed. She is not diaphoretic.  ?HENT:  ?   Right Ear: External ear normal.  ?   Left Ear: External ear normal.  ?Eyes:  ?   Conjunctiva/sclera: Conjunctivae normal.  ?Cardiovascular:  ?   Rate and Rhythm: Normal rate.  ?Pulmonary:  ?   Effort: Pulmonary effort is normal.  ?Musculoskeletal:  ?   Cervical back: Neck supple.  ?   Comments: Foot: ?Inspection: no swelling or erythema ?Palpation: Right no TTP, left mild ttp along the 4th metatarsal area ?ROM: normal w/o pain ?Strength: normal ?Ligaments: normal  ?Skin: ?   General: Skin is warm and dry.  ?   Capillary Refill: Capillary refill takes less than 2 seconds.  ?Neurological:  ?   Mental  Status: She is alert. Mental status is at baseline.  ?Psychiatric:     ?   Mood and Affect: Mood normal.     ?   Behavior: Behavior normal.  ? ? ? ? ? ?   ?Assessment & Plan:  ? ?Problem List Items Addressed This Visit   ? ?  ? Endocrine  ? Thyroid disease - Primary  ?  Feels stable. On armour thyroid. Last TSH slightly elevated - will recheck.  ?  ?  ? Relevant Orders  ? TSH  ?  ? Musculoskeletal and Integument  ? Osteoporosis  ?  C/b fracture in fall 2022. Long discussion - patient would prefer natural treatment. Last dexa was 2021 - will repeat in May. If worsening plan for endocrine referral for discussion of risk/benefits.  ?  ?  ? Relevant Orders  ? DG Bone Density  ?  ? Genitourinary  ? Genital herpes  ?  Using natural lyseine for treatment prn ?  ?  ?  ? Other  ? Bilateral foot pain  ?  Suspect some mild arthritis (spuring noted on prior foot film from fracture). Trial of prn voltaren ?  ?  ? ?  Other Visit Diagnoses   ? ? Encounter for screening mammogram for malignant neoplasm of breast      ? Relevant Orders  ? MM Digital Screening  ? ?  ? ? ? ?Return in about 3 months (around 06/11/2022) for physical - exam. ? ?Lesleigh Noe, MD ? ? ? ?

## 2022-03-12 NOTE — Assessment & Plan Note (Signed)
Suspect some mild arthritis (spuring noted on prior foot film from fracture). Trial of prn voltaren ?

## 2022-03-12 NOTE — Assessment & Plan Note (Addendum)
C/b fracture in fall 2022. Long discussion - patient would prefer natural treatment. Last dexa was 2021 - will repeat in May. If worsening plan for endocrine referral for discussion of risk/benefits.  ?

## 2022-03-12 NOTE — Assessment & Plan Note (Signed)
Using natural lyseine for treatment prn ?

## 2022-03-14 ENCOUNTER — Encounter: Payer: Self-pay | Admitting: Family Medicine

## 2022-04-12 DIAGNOSIS — L578 Other skin changes due to chronic exposure to nonionizing radiation: Secondary | ICD-10-CM | POA: Diagnosis not present

## 2022-04-12 DIAGNOSIS — Z85828 Personal history of other malignant neoplasm of skin: Secondary | ICD-10-CM | POA: Diagnosis not present

## 2022-04-12 DIAGNOSIS — L821 Other seborrheic keratosis: Secondary | ICD-10-CM | POA: Diagnosis not present

## 2022-04-12 DIAGNOSIS — D2271 Melanocytic nevi of right lower limb, including hip: Secondary | ICD-10-CM | POA: Diagnosis not present

## 2022-04-12 DIAGNOSIS — L570 Actinic keratosis: Secondary | ICD-10-CM | POA: Diagnosis not present

## 2022-04-30 ENCOUNTER — Encounter (HOSPITAL_BASED_OUTPATIENT_CLINIC_OR_DEPARTMENT_OTHER): Payer: Self-pay

## 2022-04-30 ENCOUNTER — Ambulatory Visit (HOSPITAL_BASED_OUTPATIENT_CLINIC_OR_DEPARTMENT_OTHER)
Admission: RE | Admit: 2022-04-30 | Discharge: 2022-04-30 | Disposition: A | Discharge status: Home / Self Care | Payer: Medicare PPO | Source: Ambulatory Visit | Attending: Family Medicine | Admitting: Family Medicine

## 2022-04-30 DIAGNOSIS — Z1231 Encounter for screening mammogram for malignant neoplasm of breast: Secondary | ICD-10-CM | POA: Insufficient documentation

## 2022-04-30 DIAGNOSIS — Z780 Asymptomatic menopausal state: Secondary | ICD-10-CM | POA: Diagnosis not present

## 2022-04-30 DIAGNOSIS — M810 Age-related osteoporosis without current pathological fracture: Secondary | ICD-10-CM | POA: Diagnosis not present

## 2022-04-30 DIAGNOSIS — M8588 Other specified disorders of bone density and structure, other site: Secondary | ICD-10-CM | POA: Diagnosis not present

## 2022-05-09 DIAGNOSIS — H401131 Primary open-angle glaucoma, bilateral, mild stage: Secondary | ICD-10-CM | POA: Diagnosis not present

## 2022-05-09 DIAGNOSIS — H353130 Nonexudative age-related macular degeneration, bilateral, stage unspecified: Secondary | ICD-10-CM | POA: Diagnosis not present

## 2022-05-09 DIAGNOSIS — H02403 Unspecified ptosis of bilateral eyelids: Secondary | ICD-10-CM | POA: Diagnosis not present

## 2022-05-09 DIAGNOSIS — D3132 Benign neoplasm of left choroid: Secondary | ICD-10-CM | POA: Diagnosis not present

## 2022-05-12 ENCOUNTER — Encounter: Payer: Self-pay | Admitting: Family Medicine

## 2022-05-12 DIAGNOSIS — E079 Disorder of thyroid, unspecified: Secondary | ICD-10-CM

## 2022-05-13 MED ORDER — ARMOUR THYROID 15 MG PO TABS: ORAL_TABLET | ORAL | 1 refills | Status: DC

## 2022-05-13 NOTE — Telephone Encounter (Signed)
Last fill: 01/28/22 #120 with 1  Last OV: 03/12/22

## 2022-05-30 DIAGNOSIS — H401131 Primary open-angle glaucoma, bilateral, mild stage: Secondary | ICD-10-CM | POA: Diagnosis not present

## 2022-06-13 ENCOUNTER — Encounter: Payer: Self-pay | Admitting: Family Medicine

## 2022-06-13 ENCOUNTER — Ambulatory Visit (INDEPENDENT_AMBULATORY_CARE_PROVIDER_SITE_OTHER): Payer: Medicare PPO | Admitting: Family Medicine

## 2022-06-13 VITALS — BP 80/50 | HR 68 | Temp 96.9°F | Ht 63.25 in | Wt 144.5 lb

## 2022-06-13 DIAGNOSIS — G4700 Insomnia, unspecified: Secondary | ICD-10-CM | POA: Diagnosis not present

## 2022-06-13 DIAGNOSIS — E785 Hyperlipidemia, unspecified: Secondary | ICD-10-CM | POA: Diagnosis not present

## 2022-06-13 DIAGNOSIS — M810 Age-related osteoporosis without current pathological fracture: Secondary | ICD-10-CM | POA: Diagnosis not present

## 2022-06-13 DIAGNOSIS — N1831 Chronic kidney disease, stage 3a: Secondary | ICD-10-CM | POA: Diagnosis not present

## 2022-06-13 DIAGNOSIS — R7303 Prediabetes: Secondary | ICD-10-CM | POA: Diagnosis not present

## 2022-06-13 DIAGNOSIS — G629 Polyneuropathy, unspecified: Secondary | ICD-10-CM

## 2022-06-13 DIAGNOSIS — E079 Disorder of thyroid, unspecified: Secondary | ICD-10-CM

## 2022-06-13 LAB — COMPREHENSIVE METABOLIC PANEL
ALT: 11 U/L (ref 0–35)
AST: 20 U/L (ref 0–37)
Albumin: 4.1 g/dL (ref 3.5–5.2)
Alkaline Phosphatase: 61 U/L (ref 39–117)
BUN: 24 mg/dL — ABNORMAL HIGH (ref 6–23)
CO2: 32 mEq/L (ref 19–32)
Calcium: 9.5 mg/dL (ref 8.4–10.5)
Chloride: 101 mEq/L (ref 96–112)
Creatinine, Ser: 1.18 mg/dL (ref 0.40–1.20)
GFR: 44.09 mL/min — ABNORMAL LOW (ref >60.00)
Glucose, Bld: 74 mg/dL (ref 70–99)
Potassium: 4.8 mEq/L (ref 3.5–5.1)
Sodium: 137 mEq/L (ref 135–145)
Total Bilirubin: 0.5 mg/dL (ref 0.2–1.2)
Total Protein: 6.6 g/dL (ref 6.0–8.3)

## 2022-06-13 LAB — LIPID PANEL
Cholesterol: 194 mg/dL (ref 0–200)
HDL: 68.3 mg/dL (ref >39.00)
LDL Cholesterol: 95 mg/dL (ref 0–99)
NonHDL: 125.42
Total CHOL/HDL Ratio: 3
Triglycerides: 152 mg/dL — ABNORMAL HIGH (ref 0.0–149.0)
VLDL: 30.4 mg/dL (ref 0.0–40.0)

## 2022-06-13 LAB — HEMOGLOBIN A1C: Hgb A1c MFr Bld: 6 % (ref 4.6–6.5)

## 2022-06-13 LAB — TSH: TSH: 8.37 u[IU]/mL — ABNORMAL HIGH (ref 0.35–5.50)

## 2022-06-13 LAB — VITAMIN B12: Vitamin B-12: 234 pg/mL (ref 211–911)

## 2022-06-13 NOTE — Progress Notes (Signed)
Annual Exam   Chief Complaint:  Chief Complaint  Patient presents with   Medicare Wellness    Only concern is trouble falling asleep    History of Present Illness:  Angela Buck is a 81 y.o. G1P0010 who LMP was No LMP recorded. Patient is postmenopausal., presents today for her annual examination.    Osteoporosis - in the wrist still present - she would like to avoid treatment - is doing yoga - does due to some hand weight bearing and doing gym machines occasionally    #insomnia - trouble falling asleep - last night was really tired - watched TV - no stress last night - did meditation and breathing - resting in bed - took >2 hours  - got up and read then went back to bed - partner died - hard 6-8 months after that - tried cbd oil, melatonin - worse within the last month - lays down 10-10:30, waking up   Nutrition She does get adequate calcium and Vitamin D in her diet. Diet: healthy diet Exercise: yoga, walking    Social History   Tobacco Use  Smoking Status Never  Smokeless Tobacco Never   Social History   Substance and Sexual Activity  Alcohol Use Yes   Comment: occasional wine   Social History   Substance and Sexual Activity  Drug Use No     General Health Dentist in the last year: Yes Eye doctor: yes  Safety The patient wears seatbelts: yes.     The patient feels safe at home and in their relationships: yes.   Menstrual:  Symptoms of menopause: none   GYN She is not sexually active.    Cervical Cancer Screening (21-65):   Aged out  Breast Cancer Screening (Age 62-74):   Last Mammogram: 04/2022 The patient does want a mammogram this year.    Colon Cancer Screening:  Age 26-75 yo - benefits outweigh the risk. Adults 87-85 yo who have never been screened benefit.  Benefits: 134000 people in 2016 will be diagnosed and 49,000 will die - early detection helps [place]: Complications 2/2 to colonoscopy High Risk (Colonoscopy):  genetic disorder (Lynch syndrome or familial adenomatous polyposis), personal hx of IBD, previous adenomatous polyp, or previous colorectal cancer, FamHx start 10 years before the age at diagnosis, increased in males and black race  Options:  FIT - looks for hemoglobin (blood in the stool) - specific and fairly sensitive - must be done annually Cologuard - looks for DNA and blood - more sensitive - therefore can have more false positives, every 3 years Colonoscopy - every 10 years if normal - sedation, bowl prep, must have someone drive you  Shared decision making and the patient had decided to do aged out.   Social History   Tobacco Use  Smoking Status Never  Smokeless Tobacco Never     Weight Wt Readings from Last 3 Encounters:  06/13/22 144 lb 8 oz (65.5 kg)  03/12/22 146 lb 4 oz (66.3 kg)  12/20/21 142 lb (64.4 kg)   Patient has normal BMI  BMI Readings from Last 1 Encounters:  06/13/22 25.40 kg/m     Chronic disease screening Blood pressure monitoring:  BP Readings from Last 3 Encounters:  06/13/22 (!) 80/50  03/12/22 108/60  09/20/21 126/68    Lipid Monitoring: Indication for screening: age >19, obesity, diabetes, family hx, CV risk factors.  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   Chicken pox as a child   Colonic polyp 01/05/2012   Elevated BP 01/05/2012   Fatigue 03/16/2012   History of mumps    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   Leukopenia 05/18/2014   Mass of axilla 01/05/2012   Measles as a child    Medicare annual wellness visit, subsequent 01/05/2012   Sees Dr Collene Mares of Gastroenterology    Neutropenia ([place]) 10/07/2016   Osteopenia 01/05/2012   Other and unspecified hyperlipidemia 05/18/2014   Pain of left heel 04/07/2017   Preventative health care 01/05/2012   Preventative health care 10/07/2016   Skin cancer 12/02/2009   Thyroid disease 03/16/2012   right    Vitamin D deficiency 04/07/2017    Past Surgical History:  Procedure Laterality Date   biopsy of jawline  2011   CATARACT EXTRACTION, BILATERAL     b/l   laproscopy  1970   abdominal, endometriosis, with D&C   right hip replacement  2003   [place]    Prior to Admission medications   Medication Sig Start Date End Date Taking? Authorizing Provider  ARMOUR THYROID 15 MG tablet TAKE 2 TABLETS DAILY 4 DAYS A WEEK AND 1 TABLET DAILY 3 DAYS A WEEK Strength: 15 mg 05/13/22  Yes Lesleigh Noe, MD  Ascorbic Acid (VITAMIN C) 100 MG tablet  02/07/20  Yes [provider]  latanoprost (XALATAN) 0.005 % ophthalmic solution Place 1 drop into both eyes at bedtime. 05/26/21  Yes [provider]  Vitamin D, Cholecalciferol, 10 MCG (400 UNIT) TABS  02/07/20  Yes [provider]    Allergies  Allergen Reactions   Bee Venom Anaphylaxis   Penicillins Hives    Has patient had a PCN reaction causing immediate rash, facial/tongue/throat swelling, SOB or lightheadedness with hypotension: No Has patient had a PCN reaction causing severe rash involving mucus membranes or skin necrosis: No Has patient had a PCN reaction that required hospitalization No Has patient had a PCN reaction occurring within the last 10 years: No If all of the above answers are "NO", then may proceed with Cephalosporin use.    Gynecologic History: No LMP recorded. Patient is postmenopausal.  Obstetric History: G1P0010  Social History   Socioeconomic History   Marital status: Divorced    Spouse name: Not on file   Number  of children: Not on file   Years of education: Not on file   Highest education level: Not on file  Occupational History   Not on file  Tobacco Use   Smoking status: Never   Smokeless tobacco: Never  Substance and Sexual Activity   Alcohol use: Yes    Comment: occasional wine   Drug use: No   Sexual activity: Never    Partners: Female    Comment: lives with partner, no major dietary restrictions.   Other Topics Concern   Not on file  Social History Narrative   03/12/22   From: [place] originally but moved in 1988   Living: alone - at [facility]   Work: retired Banker working with students with disability      Family: sister in [place] and Daughter in [place], [place] - 1 grandchild      Enjoys: yoga, pickle ball, travel, the arts      Exercise: daily   Diet: generally healthy      Safety   Seat belts: Yes    Guns: No   Safe in relationships: Yes       Social Determinants of Health   Financial Resource Strain: Low Risk  (11/01/2021)   Overall Financial Resource Strain (CARDIA)    Difficulty of Paying Living Expenses: Not hard at all  Food Insecurity: No Food Insecurity (11/01/2021)   Hunger Vital Sign    Worried About Running Out of Food in the Last Year: Never true    [place] in the Last Year: Never true  Transportation Needs: No Transportation Needs (11/01/2021)   PRAPARE - Hydrologist (Medical): No    Lack of Transportation (Non-Medical): No  Physical Activity: Insufficiently Active (11/01/2021)   Exercise Vital Sign    Days of Exercise per Week: 2 days    Minutes of Exercise per Session: 40 min  Stress: No Stress Concern Present (11/01/2021)   [place]    Feeling of Stress : Only a little  Social Connections: Moderately Integrated (11/01/2021)   Social Connection and Isolation Panel [NHANES]    Frequency of Communication with Friends and Family:  More than three times a week    Frequency of Social Gatherings with Friends and Family: More than three times a week    Attends Religious Services: More than 4 times per year    Active Member of [employer] or Organizations: No    Attends Music therapist: More than 4 times per year    Marital Status: Divorced  Human resources officer Violence: Not At Risk (11/01/2021)   Humiliation, Afraid, Rape, and Kick questionnaire    Fear of Current or Ex-Partner: No    Emotionally Abused: No    Physically Abused: No    Sexually Abused: No    Family History  Problem Relation Age of Onset   Arthritis Mother    Hypertension Mother    Dementia Mother    Heart attack Mother    Glaucoma Mother    Heart disease Mother        MI, stent in 2004   Emphysema Father        smoker   Hypertension Father    Glaucoma Father    COPD Father    Ulcers Maternal Grandmother    Other Paternal Grandfather        enlarged heart    Review of Systems  Constitutional:  Negative for chills and fever.  HENT:  Negative for congestion and sore throat.   Eyes:  Negative for blurred vision and double vision.  Respiratory:  Negative for shortness of breath.   Cardiovascular:  Negative for chest pain.  Gastrointestinal:  Negative for heartburn, nausea and vomiting.  Genitourinary: Negative.   Musculoskeletal: Negative.  Negative for myalgias.  Skin:  Negative for rash.  Neurological:  Negative for dizziness and headaches.  Endo/Heme/Allergies:  Does not bruise/bleed easily.  Psychiatric/Behavioral:  Negative for depression. The patient is not nervous/anxious.      Physical Exam BP (!) 80/50   Pulse 68   Temp (!) 96.9 F (36.1 C) (Temporal)  Ht 5' 3.25" (1.607 m)   Wt 144 lb 8 oz (65.5 kg)   SpO2 97%   BMI 25.40 kg/m    BP Readings from Last 3 Encounters:  06/13/22 (!) 80/50  03/12/22 108/60  09/20/21 126/68      Physical Exam Constitutional:      General: She is not in acute distress.     Appearance: She is well-developed. She is not diaphoretic.  HENT:     Head: Normocephalic and atraumatic.     Right Ear: External ear normal.     Left Ear: External ear normal.     Nose: Nose normal.  Eyes:     General: No scleral icterus.    Extraocular Movements: Extraocular movements intact.     Conjunctiva/sclera: Conjunctivae normal.  Cardiovascular:     Rate and Rhythm: Normal rate and regular rhythm.     Heart sounds: No murmur heard. Pulmonary:     Effort: Pulmonary effort is normal. No respiratory distress.     Breath sounds: Normal breath sounds. No wheezing.  Abdominal:     General: Bowel sounds are normal. There is no distension.     Palpations: Abdomen is soft. There is no mass.     Tenderness: There is no abdominal tenderness. There is no guarding or rebound.  Musculoskeletal:        General: Normal range of motion.     Cervical back: Neck supple.  Lymphadenopathy:     Cervical: No cervical adenopathy.  Skin:    General: Skin is warm and dry.     Capillary Refill: Capillary refill takes less than 2 seconds.  Neurological:     Mental Status: She is alert and oriented to person, place, and time.     Deep Tendon Reflexes: Reflexes normal.  Psychiatric:        Mood and Affect: Mood normal.        Behavior: Behavior normal.     Results:  PHQ-9:     11/01/2021    3:12 PM 07/24/2021   10:04 AM 04/18/2020    9:41 AM  Depression screen PHQ 2/9  Decreased Interest 0 0 0  Down, Depressed, Hopeless 1 0 0  PHQ - 2 Score 1 0 0       Assessment: 81 y.o. G55P0010 female here for routine annual physical examination.  Plan: Problem List Items Addressed This Visit       Endocrine   Thyroid disease - Primary   Relevant Orders   TSH     Musculoskeletal and Integument   Osteoporosis    Reviewed DEXA scan with worsening wrist, however, all other sites were improving.  Patient will continue weightbearing exercise and yoga.  Repeat in 2 years.         Genitourinary   Chronic kidney disease, stage 3a ([place])    Encouraged increased hydration, avoiding NSAIDs.      Relevant Orders   Comprehensive metabolic panel     Other   Hyperlipidemia, mild   Relevant Orders   Lipid panel   Insomnia    Sleep hygiene handout.  Failed melatonin.  Declined other medications.      Other Visit Diagnoses     Prediabetes       Relevant Orders   Hemoglobin A1c   Neuropathy       Relevant Orders   Vitamin B12       Screening: -- Blood pressure screen  low - cont to monitor -- cholesterol screening: will obtain --  Weight screening: normal -- Diabetes Screening: will obtain -- Nutrition: Encouraged healthy diet  The ASCVD Risk score (Arnett DK, et al., 2019) failed to calculate for the following reasons:   The valid systolic blood pressure range is 90 to 200 mmHg  -- Statin therapy for Age 71-75 with CVD risk >7.5%  Psych -- Depression screening (PHQ-9):    Safety -- tobacco screening: not using -- alcohol screening:  low-risk usage. -- no evidence of domestic violence or intimate partner violence.   Cancer Screening -- pap smear not collected per ASCCP guidelines -- family history of breast cancer screening: done. not at high risk. -- Mammogram -  up to date -- Colon cancer (age 92+)--  aged out  Immunizations Immunization History  Administered Date(s) Administered   [employer] Vaccination 12/20/2019, 01/17/2020, 10/13/2020, 04/19/2021   Pfizer Covid-19 Vaccine Bivalent Booster 41yr & up 08/23/2021   Tdap 07/24/2021    -- flu vaccine not up to date - declined -- TDAP q10 years up to date -- Shingles (age >>26 not up to date - declined -- PPSV-23 (19-64 with chronic disease or smoking) not up to date - decline -- PCV-13 (age >>17 - one dose followed by PPSV-23 1 year later not up to date - decline -- Covid-19 Vaccine up to date   Encouraged healthy diet and exercise. Encouraged regular vision and dental  care.    JLesleigh Noe MD

## 2022-06-13 NOTE — Assessment & Plan Note (Signed)
Sleep hygiene handout.  Failed melatonin.  Declined other medications.

## 2022-06-13 NOTE — Assessment & Plan Note (Signed)
Encouraged increased hydration, avoiding NSAIDs.

## 2022-06-13 NOTE — Assessment & Plan Note (Signed)
Reviewed DEXA scan with worsening wrist, however, all other sites were improving.  Patient will continue weightbearing exercise and yoga.  Repeat in 2 years.

## 2022-06-13 NOTE — Patient Instructions (Addendum)
Try to increase water intake - to 4 bottles (64 oz) daily - start with 3 bottles - avoid NSAIDs (ibuprofen)   Sleep hygiene checklist: 1. Avoid naps during the day 2. Avoid stimulants such as caffeine and nicotine. Avoid bedtime alcohol (it can speed onset of sleep but the body's metabolism can cause awakenings). At least 2 hours before bedtime 3. All forms of exercise help ensure sound sleep - limit vigorous exercise to morning or late afternoon 4. Avoid food too close to bedtime including chocolate (which contains caffeine) 5. Soak up natural light 6. Establish regular bedtime routine. 7. Associate bed with sleep - avoid TV, computer or phone, reading while in bed. 8. Ensure pleasant, relaxing sleep environment - quiet, dark, cool room.  Good Sleep Hygiene Habits -- Got to bed and wake up within an hour of the same time every day -- Avoid bright screens (from laptop, phone, TV) within at least 30 minutes before bed. The "blue light" supresses the sleep hormone melatonin and the content may stimulate as well -- Maintain a quiet and dark sleep environment (blackout curtains, turn on a fan or white noise to block out disruptive sounds) -- Practicing relaxing activites before bed (taking a shower, reading a book, journaling, meditation app) -- To quiet a busy mind -- consider journaling before bed (jotting down reminders, worry thoughts, as well as positive things like a gratitude list)   Begin a Mindfulness/Meditation practice -- this can take a little as 3 minutes -- You can find resources in books -- Or you can download apps like  ---- Headspace App (which currently has free content called "Weathering the Storm") ---- Calm (which has a few free options)  ---- Insignt Timer ---- Stop, Breathe & Think  # With each of these Apps - you should decline the "start free trial" offer and as you search through the App should be able to access some of their free content. You can also chose to  pay for the content if you find one that works well for you.   # Many of them also offer sleep specific content which may help with insomnia

## 2022-07-17 ENCOUNTER — Other Ambulatory Visit: Payer: Self-pay | Admitting: Family Medicine

## 2022-07-17 DIAGNOSIS — E079 Disorder of thyroid, unspecified: Secondary | ICD-10-CM

## 2022-07-18 ENCOUNTER — Telehealth: Payer: Self-pay | Admitting: Family Medicine

## 2022-07-18 DIAGNOSIS — E079 Disorder of thyroid, unspecified: Secondary | ICD-10-CM

## 2022-07-18 MED ORDER — ARMOUR THYROID 15 MG PO TABS: ORAL_TABLET | ORAL | 1 refills | Status: DC

## 2022-07-18 NOTE — Telephone Encounter (Signed)
Rx sent in

## 2022-07-18 NOTE — Telephone Encounter (Signed)
  Encourage patient to contact the pharmacy for refills or they can request refills through [facility]  Did the patient contact the pharmacy: No  LAST APPOINTMENT DATE: 06/13/22  NEXT APPOINTMENT DATE: N/A  MEDICATION: ARMOUR THYROID 15 MG tablet  Is the patient out of medication? No  If not, how much is left? Until end of month  [id number] [place] Let patient know to contact pharmacy at the end of the day to make sure medication is ready.  Please notify patient to allow 48-72 hours to process

## 2022-07-31 ENCOUNTER — Ambulatory Visit: Payer: Medicare PPO | Admitting: Family Medicine

## 2022-07-31 ENCOUNTER — Encounter: Payer: Self-pay | Admitting: Family Medicine

## 2022-07-31 VITALS — BP 100/50 | HR 69 | Temp 96.3°F | Ht 63.25 in | Wt 147.1 lb

## 2022-07-31 DIAGNOSIS — N1831 Chronic kidney disease, stage 3a: Secondary | ICD-10-CM | POA: Diagnosis not present

## 2022-07-31 DIAGNOSIS — G4700 Insomnia, unspecified: Secondary | ICD-10-CM | POA: Diagnosis not present

## 2022-07-31 DIAGNOSIS — K2960 Other gastritis without bleeding: Secondary | ICD-10-CM | POA: Diagnosis not present

## 2022-07-31 DIAGNOSIS — E079 Disorder of thyroid, unspecified: Secondary | ICD-10-CM | POA: Diagnosis not present

## 2022-07-31 LAB — BASIC METABOLIC PANEL
BUN: 26 mg/dL — ABNORMAL HIGH (ref 6–23)
CO2: 28 mEq/L (ref 19–32)
Calcium: 9.3 mg/dL (ref 8.4–10.5)
Chloride: 101 mEq/L (ref 96–112)
Creatinine, Ser: 1.19 mg/dL (ref 0.40–1.20)
GFR: 43.61 mL/min — ABNORMAL LOW (ref >60.00)
Glucose, Bld: 89 mg/dL (ref 70–99)
Potassium: 4 mEq/L (ref 3.5–5.1)
Sodium: 137 mEq/L (ref 135–145)

## 2022-07-31 LAB — TSH: TSH: 6.36 u[IU]/mL — ABNORMAL HIGH (ref 0.35–5.50)

## 2022-07-31 NOTE — Patient Instructions (Signed)

## 2022-07-31 NOTE — Assessment & Plan Note (Signed)
Continue good sleep hygiene. Declined medication

## 2022-07-31 NOTE — Progress Notes (Signed)
Subjective:     Angela Buck is a 81 y.o. female presenting for Follow-up (thyroid)     HPI  #hypothyroid - has noticed energy level is not as good - trouble falling back to sleep - feeling more tired - but still doing regular activity  Taking armour thyroid - was started on this by a homeopathic provider in [place] Taking 2 tablets 4 days a week and 1 tablet 3 days a week  Also taking potassium iodine   #Insomnia - already practicing good sleep hygiene - did not respond to melatonin - does feel thyroid can impact - took a nap recently - felt very tired at 4 pm yesterday - will still get 7-8 hours overnight - will wake in the middle of the night for 1-2 hours  Had severe abdominal pain Went to the ER 06/2021 Had some nausea/vomiting and treated at home the second time around Had recently traveled on the most recent episode  Most recently eggplant parmesan  Did an enema last week and had a large amount of stool come out  Review of Systems   Social History   Tobacco Use  Smoking Status Never  Smokeless Tobacco Never        Objective:    BP Readings from Last 3 Encounters:  07/31/22 (!) 100/50  06/13/22 (!) 80/50  03/12/22 108/60   Wt Readings from Last 3 Encounters:  07/31/22 147 lb 2 oz (66.7 kg)  06/13/22 144 lb 8 oz (65.5 kg)  03/12/22 146 lb 4 oz (66.3 kg)    BP (!) 100/50   Pulse 69   Temp (!) 96.3 F (35.7 C) (Temporal)   Ht 5' 3.25" (1.607 m)   Wt 147 lb 2 oz (66.7 kg)   SpO2 97%   BMI 25.86 kg/m    Physical Exam Constitutional:      General: She is not in acute distress.    Appearance: She is well-developed. She is not diaphoretic.  HENT:     Right Ear: External ear normal.     Left Ear: External ear normal.     Nose: Nose normal.  Eyes:     Conjunctiva/sclera: Conjunctivae normal.  Cardiovascular:     Rate and Rhythm: Normal rate.  Pulmonary:     Effort: Pulmonary effort is normal.  Musculoskeletal:     Cervical  back: Neck supple.  Skin:    General: Skin is warm and dry.     Capillary Refill: Capillary refill takes less than 2 seconds.  Neurological:     Mental Status: She is alert. Mental status is at baseline.  Psychiatric:        Mood and Affect: Mood normal.        Behavior: Behavior normal.           Assessment & Plan:   Problem List Items Addressed This Visit       Digestive   Reflux gastritis    A few acute episodes of nausea/vomiting after eating acidic food.  Suspect this may be reflux, advised Tums or famotidine as needed.  Handout on reflux diet for foods to avoid.  Return if persisting.        Endocrine   Thyroid disease - Primary    Armour thyroid is on BEERs list 2/2 to cardiac risk. Will update patient. She never tried levothyroxine. Recheck TSH if still not at goal given more tiredness anticipate increasing dose.       Relevant Orders   TSH  Genitourinary   Chronic kidney disease, stage 3a ([place])    She has been working on hydration. Recheck labs today      Relevant Orders   Basic metabolic panel     Other   Insomnia    Continue good sleep hygiene. Declined medication        Return if symptoms worsen or fail to improve.  Lesleigh Noe, MD

## 2022-07-31 NOTE — Assessment & Plan Note (Signed)
She has been working on hydration. Recheck labs today

## 2022-07-31 NOTE — Assessment & Plan Note (Signed)
Armour thyroid is on BEERs list 2/2 to cardiac risk. Will update patient. She never tried levothyroxine. Recheck TSH if still not at goal given more tiredness anticipate increasing dose.

## 2022-07-31 NOTE — Assessment & Plan Note (Signed)
A few acute episodes of nausea/vomiting after eating acidic food.  Suspect this may be reflux, advised Tums or famotidine as needed.  Handout on reflux diet for foods to avoid.  Return if persisting.

## 2022-08-26 DIAGNOSIS — H401131 Primary open-angle glaucoma, bilateral, mild stage: Secondary | ICD-10-CM | POA: Diagnosis not present

## 2022-09-10 ENCOUNTER — Telehealth (INDEPENDENT_AMBULATORY_CARE_PROVIDER_SITE_OTHER): Payer: Medicare PPO | Admitting: Family Medicine

## 2022-09-10 ENCOUNTER — Encounter: Payer: Self-pay | Admitting: Family Medicine

## 2022-09-10 VITALS — Ht 63.5 in | Wt 145.0 lb

## 2022-09-10 DIAGNOSIS — U071 COVID-19: Secondary | ICD-10-CM | POA: Diagnosis not present

## 2022-09-10 MED ORDER — NIRMATRELVIR/RITONAVIR (PAXLOVID)TABLET: 3.0000 | ORAL_TABLET | Freq: Two times a day (BID) | ORAL | 0 refills | Status: DC

## 2022-09-10 MED ORDER — NIRMATRELVIR/RITONAVIR (PAXLOVID) TABLET (RENAL DOSING): 2.0000 | ORAL_TABLET | Freq: Two times a day (BID) | ORAL | 0 refills | Status: AC

## 2022-09-10 NOTE — Patient Instructions (Signed)
HOME CARE TIPS:   -I sent the medication(s) we discussed to your pharmacy: Meds ordered this encounter  Medications   nirmatrelvir/ritonavir EUA, renal dosing, (PAXLOVID) 10 x 150 MG & 10 x '100MG'$  TABS    Sig: Take 2 tablets by mouth 2 (two) times daily for 5 days. (Take nirmatrelvir 150 mg one tablet twice daily for 5 days and ritonavir 100 mg one tablet twice daily for 5 days) Patient GFR is 43    Dispense:  20 tablet    Refill:  0     -I sent in the [place] treatment or referral you requested per our discussion. Please see the information provided below and discuss further with the pharmacist/treatment team.  -If taking Paxlovid, please review all medications, supplement and over the counter drugs with your pharmacist and ask them to check for any interactions.   -there is a chance of rebound illness with covid after improving. This can happen whether or not you take an antiviral treatment. If you become sick again with covid after getting better, please schedule a follow up virtual visit and isolate again.  -nasal saline sinus rinses twice daily  -stay hydrated, drink plenty of fluids and eat small healthy meals - avoid dairy  -follow up with your doctor in 2-3 days unless improving and feeling better  -stay home while sick, except to seek medical care. If you have COVID19, you will likely be contagious for 7-10 days. Flu or Influenza is likely contagious for about 7 days. Other respiratory viral infections remain contagious for 5-10+ days depending on the virus and many other factors. Wear a good mask that fits snugly (such as N95 or KN95) if around others to reduce the risk of transmission.  It was nice to meet you today, and I really hope you are feeling better soon. I help [facility] out with telemedicine visits on Tuesdays and Thursdays and am happy to help if you need a follow up virtual visit on those days. Otherwise, if you have any concerns or questions following this visit  please schedule a follow up visit with your Primary Care doctor or seek care at a local urgent care clinic to avoid delays in care.    Seek in person care or schedule a follow up video visit promptly if your symptoms worsen, new concerns arise or you are not improving with treatment. Call 911 and/or seek emergency care if your symptoms are severe or life threatening.   See the following link for the most recent information regarding Paxlovid:  [email or web address].com   Nirmatrelvir; Ritonavir Tablets What is this medication? NIRMATRELVIR; RITONAVIR (NIR ma TREL vir; ri TOE na veer) treats mild to moderate COVID-19. It may help people who are at high risk of developing severe illness. This medication works by limiting the spread of the virus in your body. The FDA has allowed the emergency use of this medication. This medicine may be used for other purposes; ask your health care provider or pharmacist if you have questions. COMMON BRAND NAME(S): PAXLOVID What should I tell my care team before I take this medication? They need to know if you have any of these conditions: Any allergies Any serious illness Kidney disease Liver disease An unusual or allergic reaction to nirmatrelvir, ritonavir, other medications, foods, dyes, or preservatives Pregnant or trying to get pregnant Breast-feeding How should I use this medication? This product contains 2 different medications that are packaged together. For the standard dose, take 2 pink tablets of nirmatrelvir with 1 white  tablet of ritonavir (3 tablets total) by mouth with water twice daily. Talk to your care team if you have kidney disease. You may need a different dose. Swallow the tablets whole. You can take it with or without food. If it upsets your stomach, take it with food. Take all of this medication unless your care team tells you to stop it early. Keep taking it even if you think you are better. Talk to your care team about the use of this  medication in children. While it may be prescribed for children as young as 12 years for selected conditions, precautions do apply. Overdosage: If you think you have taken too much of this medicine contact a poison control center or emergency room at once. NOTE: This medicine is only for you. Do not share this medicine with others. What if I miss a dose? If you miss a dose, take it as soon as you can unless it is more than 8 hours late. If it is more than 8 hours late, skip the missed dose. Take the next dose at the normal time. Do not take extra or 2 doses at the same time to make up for the missed dose. What may interact with this medication? Do not take this medication with any of the following medications: Alfuzosin Certain medications for anxiety or sleep like midazolam, triazolam Certain medications for cancer like apalutamide, enzalutamide Certain medications for cholesterol like lovastatin, simvastatin Certain medications for irregular heart beat like amiodarone, dronedarone, flecainide, propafenone, quinidine Certain medications for pain like meperidine, piroxicam Certain medications for psychotic disorders like clozapine, lurasidone, pimozide Certain medications for seizures like carbamazepine, phenobarbital, phenytoin Colchicine Eletriptan Eplerenone Ergot alkaloids like dihydroergotamine, ergonovine, ergotamine, methylergonovine Finerenone Flibanserin Ivabradine Lomitapide Naloxegol Ranolazine Rifampin Sildenafil Silodosin St. John's Wort Tolvaptan Ubrogepant Voclosporin This medication may also interact with the following medications: Bedaquiline Birth control pills Bosentan Certain antibiotics like erythromycin or clarithromycin Certain medications for blood pressure like amlodipine, diltiazem, felodipine, nicardipine, nifedipine Certain medications for cancer like abemaciclib, ceritinib, dasatinib, encorafenib, ibrutinib, ivosidenib, neratinib, nilotinib,  venetoclax, vinblastine, vincristine Certain medications for cholesterol like atorvastatin, rosuvastatin Certain medications for depression like bupropion, trazodone Certain medications for fungal infections like isavuconazonium, itraconazole, ketoconazole, voriconazole Certain medications for hepatitis C like elbasvir; grazoprevir, dasabuvir; ombitasvir; paritaprevir; ritonavir, glecaprevir; pibrentasvir, sofosbuvir; velpatasvir; voxilaprevir Certain medications for HIV or AIDS Certain medications for irregular heartbeat like lidocaine Certain medications that treat or prevent blood clots like rivaroxaban, warfarin Digoxin Fentanyl Medications that lower your chance of fighting infection like cyclosporine, sirolimus, tacrolimus Methadone Quetiapine Rifabutin Salmeterol Steroid medications like betamethasone, budesonide, ciclesonide, dexamethasone, fluticasone, methylprednisone, mometasone, triamcinolone This list may not describe all possible interactions. Give your health care provider a list of all the medicines, herbs, non-prescription drugs, or dietary supplements you use. Also tell them if you smoke, drink alcohol, or use illegal drugs. Some items may interact with your medicine. What should I watch for while using this medication? Your condition will be monitored carefully while you are receiving this medication. Visit your care team for regular checkups. Tell your care team if your symptoms do not start to get better or if they get worse. If you have untreated HIV infection, this medication may lead to some HIV medications not working as well in the future. Birth control may not work properly while you are taking this medication. Talk to your care team about using an extra method of birth control. What side effects may I notice from receiving this medication? Side effects  that you should report to your care team as soon as possible: Allergic reactions--skin rash, itching, hives,  swelling of the face, lips, tongue, or throat Liver injury--right upper belly pain, loss of appetite, nausea, light-colored stool, dark yellow or brown urine, yellowing skin or eyes, unusual weakness or fatigue Redness, blistering, peeling, or loosening of the skin, including inside the mouth Side effects that usually do not require medical attention (report these to your care team if they continue or are bothersome): Change in taste Diarrhea General discomfort and fatigue Increase in blood pressure Muscle pain Nausea Stomach pain This list may not describe all possible side effects. Call your doctor for medical advice about side effects. You may report side effects to FDA at 1-800-FDA-1088. Where should I keep my medication? Keep out of the reach of children and pets. Store at room temperature between 20 and 25 degrees C (68 and 77 degrees F). Get rid of any unused medication after the expiration date. To get rid of medications that are no longer needed or have expired: Take the medication to a medication take-back program. Check with your pharmacy or law enforcement to find a location. If you cannot return the medication, check the label or package insert to see if the medication should be thrown out in the garbage or flushed down the toilet. If you are not sure, ask your care team. If it is safe to put it in the trash, take the medication out of the container. Mix the medication with cat litter, dirt, coffee grounds, or other unwanted substance. Seal the mixture in a bag or container. Put it in the trash. NOTE: This sheet is a summary. It may not cover all possible information. If you have questions about this medicine, talk to your doctor, pharmacist, or health care provider.  2022 Elsevier/Gold Standard (2021-08-20 00:00:00)

## 2022-09-10 NOTE — Progress Notes (Signed)
Virtual Visit via Video Note  I connected with Angela Buck  on 09/10/22 at  3:00 PM EDT by a video enabled telemedicine application and verified that I am speaking with the correct person using two identifiers.  Location patient: [place] Location provider:work or home office Persons participating in the virtual visit: patient, provider  I discussed the limitations and requested verbal permission for telemedicine visit. The patient expressed understanding and agreed to proceed.   HPI:  Acute telemedicine visit for Covid19: -Onset: exposed last week, 4 days ago, tested positive today -Symptoms include: nasal congestion, sore throat -Denies: CP, SOB, fever, NVD -Has tried:none -Pertinent past medical history: see below, had covid once earlier this year -Pertinent medication allergies: Allergies  Allergen Reactions   Bee Venom Anaphylaxis   Penicillins Hives    Has patient had a PCN reaction causing immediate rash, facial/tongue/throat swelling, SOB or lightheadedness with hypotension: No Has patient had a PCN reaction causing severe rash involving mucus membranes or skin necrosis: No Has patient had a PCN reaction that required hospitalization No Has patient had a PCN reaction occurring within the last 10 years: No If all of the above answers are "NO", then may proceed with Cephalosporin use.  -COVID-19 vaccine status: has had 5 doses of the vaccines Immunization History  Administered Date(s) Administered   Moderna Sars-Covid-2 Vaccination 12/20/2019, 01/17/2020, 10/13/2020, 04/19/2021   Pfizer Covid-19 Vaccine Bivalent Booster 52yr & up 08/23/2021   Tdap 07/24/2021     ROS: See pertinent positives and negatives per HPI.  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/07/2016   Osteopenia 01/05/2012   Other and unspecified hyperlipidemia 05/18/2014   Pain of left heel 04/07/2017   Preventative health care 01/05/2012   Preventative health care 10/07/2016   Skin cancer 12/02/2009   Thyroid disease 03/16/2012   right    Vitamin D deficiency 04/07/2017    Past Surgical History:  Procedure Laterality Date   biopsy of jawline  2011   CATARACT EXTRACTION, BILATERAL     b/l   laproscopy  1970   abdominal, endometriosis, with D&C   right hip replacement  2003   TONSILLECTOMY AND ADENOIDECTOMY  1949     Current Outpatient Medications:    ARMOUR THYROID 15 MG tablet, TAKE 2 TABLETS DAILY 4 DAYS A WEEK AND 1 TABLET DAILY 3 DAYS A WEEK Strength: 15 mg, Disp: 120 tablet, Rfl: 1   Ascorbic Acid (VITAMIN C) 100 MG tablet, , Disp: , Rfl:    latanoprost (XALATAN) 0.005 % ophthalmic solution, Place 1 drop into both eyes at bedtime., Disp: , Rfl:    nirmatrelvir/ritonavir EUA (PAXLOVID) 20 x 150 MG & 10 x '100MG'$  TABS, Take 3 tablets by mouth 2 (two) times daily for 5 days. (Take nirmatrelvir 150 mg two tablets twice daily for 5 days and ritonavir 100 mg one tablet twice daily for 5 days) Patient GFR is 43 last month, Disp: 30 tablet, Rfl: 0   Vitamin D, Cholecalciferol, 10 MCG (400 UNIT) TABS, , Disp: ,  Rfl:    IODINE-POTASSIUM IODIDE PO, Take 12.5 mg by mouth. (Patient not taking: Reported on 09/10/2022), Disp: , Rfl:   EXAM:  VITALS per patient if applicable:  GENERAL: alert, oriented, appears well and in no acute distress  HEENT: atraumatic, conjunttiva clear, no obvious abnormalities on inspection of external nose and ears  NECK: normal movements of the head and neck  LUNGS: on inspection no signs of respiratory distress, breathing rate appears normal, no obvious gross SOB, gasping or  wheezing  CV: no obvious cyanosis  MS: moves all visible extremities without noticeable abnormality  PSYCH/NEURO: pleasant and cooperative, no obvious depression or anxiety, speech and thought processing grossly intact  ASSESSMENT AND PLAN:  Discussed the following assessment and plan:  COVID-19  Discussed treatment options, side effect and risk of drug interactions, ideal treatment window, potential complications, isolation and precautions for COVID-19.  Discussed possibility of rebound with or without antivirals. Checked for/reviewed last GFR - listed in HPI if available.  After lengthy discussion, the patient opted for treatment with renally dosed paxlovid due to being higher risk for complications of covid or severe disease and other factors. Discussed EUA status of this drug and the fact that there is preliminary limited knowledge of risks/interactions/side effects per EUA document vs possible benefits and precautions. This information was shared with patient during the visit and also was provided in patient instructions. Also, advised that patient discuss risks/interactions and use with pharmacist/treatment team as well. he patient did want a prescription for cough, Tessalon Rx sent.  Other symptomatic care measures summarized in patient instructions. Note: hit the regular paxlovid rx in the computer by mistake initially. Cancelled that order, placed order for renally dosed paxlovid and called and spoke with pharmacist about change. He agreed to cancel and ensure pt got there renal dose.  Advised to seek prompt virtual visit or in person care if worsening, new symptoms arise, or if is not improving with treatment as expected per our conversation of expected course. Discussed options for follow up care. Did let this patient know that I do telemedicine on Tuesdays and Thursdays for [facility] [facility] and those are the days I am logged into the system. Advised to schedule follow up visit with PCP, [place]  virtual visits or [facility] if any further questions or concerns to avoid delays in care.   I discussed the assessment and treatment plan with the patient. The patient was provided an opportunity to ask questions and all were answered. The patient agreed with the plan and demonstrated an understanding of the instructions.     Lucretia Kern, DO

## 2022-09-25 ENCOUNTER — Encounter: Payer: Self-pay | Admitting: Student

## 2022-09-25 ENCOUNTER — Ambulatory Visit
Admission: RE | Admit: 2022-09-25 | Discharge: 2022-09-25 | Disposition: A | Discharge status: Home / Self Care | Payer: Medicare PPO | Source: Ambulatory Visit | Attending: Student | Admitting: Student

## 2022-09-25 ENCOUNTER — Ambulatory Visit: Payer: Medicare PPO | Admitting: Student

## 2022-09-25 VITALS — BP 116/70 | HR 72 | Temp 98.4°F | Ht 63.5 in | Wt 145.0 lb

## 2022-09-25 DIAGNOSIS — K5901 Slow transit constipation: Secondary | ICD-10-CM

## 2022-09-25 DIAGNOSIS — M79671 Pain in right foot: Secondary | ICD-10-CM | POA: Diagnosis not present

## 2022-09-25 DIAGNOSIS — E079 Disorder of thyroid, unspecified: Secondary | ICD-10-CM

## 2022-09-25 DIAGNOSIS — N1831 Chronic kidney disease, stage 3a: Secondary | ICD-10-CM | POA: Diagnosis not present

## 2022-09-25 DIAGNOSIS — Z8781 Personal history of (healed) traumatic fracture: Secondary | ICD-10-CM | POA: Diagnosis not present

## 2022-09-25 DIAGNOSIS — G4700 Insomnia, unspecified: Secondary | ICD-10-CM

## 2022-09-25 DIAGNOSIS — E559 Vitamin D deficiency, unspecified: Secondary | ICD-10-CM

## 2022-09-25 DIAGNOSIS — M79672 Pain in left foot: Secondary | ICD-10-CM | POA: Diagnosis not present

## 2022-09-25 DIAGNOSIS — M7732 Calcaneal spur, left foot: Secondary | ICD-10-CM | POA: Diagnosis not present

## 2022-09-25 DIAGNOSIS — M810 Age-related osteoporosis without current pathological fracture: Secondary | ICD-10-CM

## 2022-09-25 MED ORDER — LEVOTHYROXINE SODIUM 50 MCG PO TABS: 50.0000 ug | ORAL_TABLET | Freq: Every day | ORAL | 3 refills | Status: DC

## 2022-09-25 NOTE — Progress Notes (Signed)
Location:  [facility] [facility]  Provider: Nico Syme  Code Status: Full Code Goals of Care:     09/25/2022   12:55 PM  Advanced Directives  Does Patient Have a Medical Advance Directive? Yes  Type of Paramedic of [place];Living will  Does patient want to make changes to medical advance directive? No - Patient declined  Copy of [place] in Chart? No - copy requested     Chief Complaint  Patient presents with   Establish Care    New patient establish care. Discuss thyroid medication alternative, bilateral foot concerns (left is worse), and dry throat after sleeping.     HPI: Patient is a 81 y.o. female seen today for medical management of chronic diseases.  She was previously in a primary care provider at [place], but since her move from [place] this is more convenient.   She lives alone home. Her partner died 1 year ago (she died of breast cancer 2021-01-10). She has been here for 4 years. She is fully independent and she gets housekeeping 1x per month.   She feels like in general her health is very good. She played 2 hours of pickleball this morning, bicycle. She used to have homeopathic medication. She was recommended to use armour thyroid   She has had some trouble getting back to sleep in the middle of the night. She gets up after about 2 hours to use the bathroom. Sometimes she goes right back to sleep and other times she lays there and sings. She doesn't watch the computer. Doesn't watch TV. She does some artwork or reading. She is trying to make sure she pays attention to the things   May have a glass of wine 7 days again. She has tried melatonin and CBD oil in the past without help. She feels refreshed even though she doesn't feel like she sleeps as deeply. She wakes up at 2AM and 4AM.   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   Leukopenia 05/18/2014   Mass of axilla 01/05/2012   Measles as a child   Medicare annual wellness visit, subsequent 01/05/2012   Sees Dr Collene Mares of Gastroenterology    Neutropenia ([place]) 10/07/2016   Osteopenia 01/05/2012   Other and unspecified hyperlipidemia 05/18/2014   Pain of left heel 04/07/2017   Preventative health care 01/05/2012   Preventative health care 10/07/2016   Skin cancer 12/02/2009   Thyroid disease 03/16/2012   right    Vitamin D deficiency 04/07/2017    Past Surgical History:  Procedure Laterality Date   biopsy of jawline  2011   CATARACT EXTRACTION, BILATERAL     b/l   laproscopy  1970   abdominal, endometriosis, with D&C   right hip replacement  2003   TONSILLECTOMY AND ADENOIDECTOMY  1949    Allergies  Allergen Reactions   Bee Venom Anaphylaxis   Penicillins Hives    Has patient had a PCN reaction causing immediate rash, facial/tongue/throat swelling, SOB or lightheadedness with hypotension: No Has patient had a PCN reaction causing severe rash involving mucus membranes or skin necrosis: No Has patient had a PCN reaction that required hospitalization No Has  patient had a PCN reaction occurring within the last 10 years: No If all of the above answers are "NO", then may proceed with Cephalosporin use.    Outpatient Encounter Medications as of 09/25/2022  Medication Sig   Ascorbic Acid (VITAMIN C) 100 MG tablet Take 500 mg by mouth daily.   latanoprost (XALATAN) 0.005 % ophthalmic solution Place 1 drop into both eyes at bedtime.   levothyroxine (SYNTHROID) 50 MCG tablet Take 1 tablet (50 mcg total) by mouth daily.   Misc Natural Products (COLON CARE PO) Take 1 capsule by mouth at bedtime. Colon Max   OVER THE COUNTER MEDICATION Take 1 capsule by mouth daily. Ioderol   Vitamin D-Vitamin K (VITAMIN D2 + K1 PO) Take 1 capsule  by mouth daily.   [DISCONTINUED] ARMOUR THYROID 15 MG tablet TAKE 2 TABLETS DAILY 4 DAYS A WEEK AND 1 TABLET DAILY 3 DAYS A WEEK Strength: 15 mg   [DISCONTINUED] IODINE-POTASSIUM IODIDE PO Take 12.5 mg by mouth. (Patient not taking: Reported on 09/10/2022)   [DISCONTINUED] Vitamin D, Cholecalciferol, 10 MCG (400 UNIT) TABS    No facility-administered encounter medications on file as of 09/25/2022.    Review of Systems:  Review of Systems  All other systems reviewed and are negative.   Health Maintenance  Topic Date Due   Medicare Annual Wellness (AWV)  11/01/2022   Pneumonia Vaccine 33+ Years old (1 - PCV) 09/11/2023 (Originally 06/17/2008)   DEXA SCAN  Completed   HPV VACCINES  Aged Out   INFLUENZA VACCINE  Discontinued   COLONOSCOPY (Pts 45-52yr Insurance coverage will need to be confirmed)  Discontinued   TETANUS/TDAP  Discontinued   COVID-19 Vaccine  Discontinued   Hepatitis C Screening  Discontinued   Zoster Vaccines- Shingrix  Discontinued    Physical Exam: Vitals:   09/25/22 1255  BP: 116/70  Pulse: 72  Temp: 98.4 F (36.9 C)  TempSrc: Temporal  SpO2: 98%  Weight: 145 lb (65.8 kg)  Height: 5' 3.5" (1.613 m)   Body mass index is 25.28 kg/m. Physical Exam Vitals reviewed.  Constitutional:      Appearance: Normal appearance.  Cardiovascular:     Rate and Rhythm: Normal rate and regular rhythm.     Pulses: Normal pulses.     Heart sounds: Normal heart sounds.  Pulmonary:     Effort: Pulmonary effort is normal.  Abdominal:     General: Abdomen is flat. Bowel sounds are normal.     Palpations: Abdomen is soft.  Musculoskeletal:        General: No swelling or tenderness.  Skin:    General: Skin is warm and dry.     Capillary Refill: Capillary refill takes 2 to 3 seconds.  Neurological:     Mental Status: She is alert and oriented to person, place, and time.     Gait: Gait normal.  Psychiatric:        Mood and Affect: Mood normal.     Labs  reviewed: Basic Metabolic Panel: Recent Labs    03/12/22 1201 06/13/22 1224 07/31/22 1234  NA  --  137 137  K  --  4.8 4.0  CL  --  101 101  CO2  --  32 28  GLUCOSE  --  74 89  BUN  --  24* 26*  CREATININE  --  1.18 1.19  CALCIUM  --  9.5 9.3  TSH 6.61* 8.37* 6.36*   Liver Function Tests: Recent Labs    06/13/22 1224  AST 20  ALT 11  ALKPHOS 61  BILITOT 0.5  PROT 6.6  ALBUMIN 4.1   No results for input(s): "LIPASE", "AMYLASE" in the last 8760 hours. No results for input(s): "AMMONIA" in the last 8760 hours. CBC: No results for input(s): "WBC", "NEUTROABS", "HGB", "HCT", "MCV", "PLT" in the last 8760 hours. Lipid Panel: Recent Labs    06/13/22 1224  CHOL 194  HDL 68.30  LDLCALC 95  TRIG 152.0*  CHOLHDL 3   Lab Results  Component Value Date   HGBA1C 6.0 06/13/2022    Procedures since last visit: DG Foot Complete Left  Result Date: 09/27/2022 CLINICAL DATA:  Pain, previous history of fracture of third metatarsal EXAM: LEFT FOOT - COMPLETE 3+ VIEW COMPARISON:  09/20/2021 FINDINGS: No recent fracture or dislocation is seen. There is deformity in the distal shaft of third metatarsal, most likely old healed fracture. There is no break in the cortical margins. Plantar spur is seen in calcaneus. Small bony spurs are seen in the dorsal aspect of intertarsal joints. IMPRESSION: No recent fracture or dislocation is seen. Old healed fracture is seen in the distal shaft of left third metatarsal. Plantar spur is seen in calcaneus. Small bony spurs seen in the intertarsal joints. Electronically Signed   By: Elmer Picker M.D.   On: 09/27/2022 16:55    Assessment/Plan 1. Thyroid disease Patient has been on armour thyroid for many years previously with homeopathic physician. Learned this is not recommended. Discussed this medication does not need a taper and should just start levothyroxine at equivalent dose. Will start Synthroid 50 mcg daily and recheck levels in 6 weeks.    2. Bilateral foot pain 3. Hx of fracture of foot Patient with history of fracture. Continues to have pain in the foot. Will plan for x-ray of foot. If there are more concerns will be seen with orthopedics.   4. Chronic kidney disease, stage 3a (HCC) GFR stable at 3a level. Will continue with q3 mo labs including PTH and Vitamin D annually.   GFR  Date/Time Value Ref Range Status  07/31/2022 12:34 PM 43.61 (L) >60.00 mL/min Final    Comment:    Calculated using the CKD-EPI Creatinine Equation (2021)  ]  5. Constipation by delayed colonic transit Patient with longstanding history of constipation She states her current regimen is sufficient.   6. Vitamin D deficiency Osteoporosis without current pathological fracture, unspecified osteoporosis type Patient taking Vitamin D supplementation. Preferred treatment for osteoporosis. Will continue conversation regarding other treatment options with bisphosphonate, denosemab, or other options. Continue to discuss concern for fractures given this issue.   8. Insomnia, unspecified type Longstanding history of insomnia. Discussed lifestyle changes. Melatonin has not helped in the past. Will continue discussion at future visit. Will consider CBT-insomnia given desire to manage without medications.   Tomasa Rand, MD, [facility] [phone]

## 2022-09-25 NOTE — Patient Instructions (Addendum)
Please stop Armour Thyroid  Start Levothyroxine 50 mcg daily.   Please get your x-ray of your left foot. Based on results you will get a follow up appointment with the ortohpedic doctors.   Consider getting your PCV 13 (pneumonia) vaccination and your Shingrix vaccine at your local pharmacy.   Fasting labs for 11/11/2022 at [facility], 7:30 am

## 2022-09-27 ENCOUNTER — Encounter: Payer: Self-pay | Admitting: Student

## 2022-09-30 NOTE — Telephone Encounter (Signed)
Message routed to PCP Beamer, Victoria, MD  

## 2022-10-09 NOTE — Addendum Note (Signed)
Addended by: Dewayne Shorter on: 10/09/2022 03:55 PM   Modules accepted: Level of Service

## 2022-11-11 LAB — CBC AND DIFFERENTIAL
HCT: 35 — AB (ref 36–46)
Hemoglobin: 11.5 — AB (ref 12.0–16.0)
Platelets: 288 10*3/uL (ref 150–400)
WBC: 4.5

## 2022-11-11 LAB — BASIC METABOLIC PANEL
BUN: 25 — AB (ref 4–21)
CO2: 28 — AB (ref 13–22)
Chloride: 106 (ref 99–108)
Creatinine: 0.9 (ref 0.5–1.1)
Glucose: 83
Potassium: 4.7 mEq/L (ref 3.5–5.1)
Sodium: 140 (ref 137–147)

## 2022-11-11 LAB — VITAMIN D 25 HYDROXY (VIT D DEFICIENCY, FRACTURES): Vit D, 25-Hydroxy: 64

## 2022-11-11 LAB — TSH: TSH: 0.06 — AB (ref 0.41–5.90)

## 2022-11-11 LAB — COMPREHENSIVE METABOLIC PANEL
Calcium: 8.7 (ref 8.7–10.7)
eGFR: 65

## 2022-11-11 LAB — CBC: RBC: 3.81 — AB (ref 3.87–5.11)

## 2022-12-09 ENCOUNTER — Telehealth: Payer: Self-pay

## 2022-12-09 NOTE — Telephone Encounter (Signed)
Patient left message on clinical intake voicemail. She stated that she has had a cough for 2 weeks and is scheduled to see Dr. Unk Lightning on Friday. She wanted to know if she could get something before then.   I tried calling patient back to see if she would be able to make an appointment for today. I left a voicemail for patient to call office.

## 2022-12-11 ENCOUNTER — Encounter: Payer: Self-pay | Admitting: Student

## 2022-12-11 ENCOUNTER — Ambulatory Visit: Payer: Medicare PPO | Admitting: Student

## 2022-12-11 VITALS — BP 138/80 | HR 62 | Temp 97.7°F | Ht 63.5 in | Wt 148.0 lb

## 2022-12-11 DIAGNOSIS — R052 Subacute cough: Secondary | ICD-10-CM | POA: Diagnosis not present

## 2022-12-11 DIAGNOSIS — E079 Disorder of thyroid, unspecified: Secondary | ICD-10-CM | POA: Diagnosis not present

## 2022-12-11 DIAGNOSIS — N393 Stress incontinence (female) (male): Secondary | ICD-10-CM | POA: Diagnosis not present

## 2022-12-11 MED ORDER — LEVOTHYROXINE SODIUM 25 MCG PO TABS: 25.0000 ug | ORAL_TABLET | Freq: Every day | ORAL | 3 refills | Status: DC

## 2022-12-11 NOTE — Patient Instructions (Signed)
I'm refilling your thyroid medication at a lower dose. Please cut your tablet in half until you need your next refill.   We will need to recheck your thyroid level in about 6 weeks.   This cough syrup could be helpful for your symptoms.

## 2022-12-11 NOTE — Progress Notes (Signed)
Location:  [place] [facility]  Provider: Llewyn Heap  Code Status: Full Goals of Care:     12/11/2022    3:42 PM  Advanced Directives  Does Patient Have a Medical Advance Directive? Yes  Type of Advance Directive [place]  Does patient want to make changes to medical advance directive? No - Patient declined  Copy of [place] in Chart? Yes - validated most recent copy scanned in chart (See row information)   Chief Complaint  Patient presents with   Medical Management of Chronic Issues    Medical Management of Chronic Issues. Complains of Persistent cough for 3 weeks and blowing nose a lot.     HPI: Patient is a 82 y.o. female seen today for medical management of chronic diseases.   She has had a cough for two weeks.   She has noticed she has a dry mouth. She wonders if it's due to her thyroid medicine. She went away for Christmas and was coughing in [place], coughing in new [place]. It feels like things have gotten worse. She is somewhat feeling better today.  She continues to have some congestion. She has had some tylenol PM the last two nights for sleep. She has been blowing her nose as well. She has been drinking tea and taking cough drops as well.  She denies fevers, but endorses some chills. She has been negative for COVID. She has had some diarrhea and was feeling achy yesterday (it was raining as well).   She just got out her humidifier as well. She put some VICKS under her nose and it helped some. Some of her sense of smell has been altered.   Previous COVID 12/2021 and 09/2022.   She has had some urination with coughing. She feels like the urination kind of starts and stops. She is having good bowel movement. The diarrhea has been 1x per day. She usually has bowel movements once a day. No pets.  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   Leukopenia 05/18/2014   Mass of axilla 01/05/2012   Measles as a child   Medicare annual wellness visit, subsequent 01/05/2012   Sees Dr Collene Mares of Gastroenterology    Neutropenia ([place]) 10/07/2016   Osteopenia 01/05/2012   Other and unspecified hyperlipidemia 05/18/2014   Pain of left heel 04/07/2017   Preventative health care 01/05/2012   Preventative health care 10/07/2016   Skin cancer 12/02/2009   Thyroid disease 03/16/2012   right    Vitamin D deficiency 04/07/2017    Past Surgical History:  Procedure Laterality Date   biopsy of jawline  2011   CATARACT EXTRACTION, BILATERAL     b/l   laproscopy  1970   abdominal, endometriosis, with D&C   right hip replacement  2003   TONSILLECTOMY AND ADENOIDECTOMY  1949    Allergies  Allergen Reactions   Bee Venom Anaphylaxis   Penicillins Hives    Has patient had a PCN reaction causing immediate rash, facial/tongue/throat swelling, SOB or lightheadedness with hypotension: No Has patient had a PCN reaction causing severe rash involving mucus membranes or skin necrosis: No Has patient had a PCN reaction that required hospitalization No Has  patient had a PCN reaction occurring within the last 10 years: No If all of the above answers are "NO", then may proceed with Cephalosporin use.    Outpatient Encounter Medications as of 12/11/2022  Medication Sig   Ascorbic Acid (VITAMIN C) 100 MG tablet Take 500 mg by mouth daily.   latanoprost (XALATAN) 0.005 % ophthalmic solution Place 1 drop into both eyes at bedtime.   levothyroxine (SYNTHROID) 25 MCG tablet Take 1 tablet (25 mcg total) by mouth daily.   Misc Natural Products (COLON CARE PO) Take 1 capsule by mouth at bedtime. Colon Max   OVER THE COUNTER MEDICATION Take 1 capsule by mouth daily. Ioderol   Vitamin D-Vitamin K (VITAMIN D2 + K1 PO) Take 1 capsule by mouth daily.    [DISCONTINUED] levothyroxine (SYNTHROID) 50 MCG tablet Take 1 tablet (50 mcg total) by mouth daily.   No facility-administered encounter medications on file as of 12/11/2022.    Review of Systems:  Review of Systems  Health Maintenance  Topic Date Due   Medicare Annual Wellness (AWV)  11/01/2022   Pneumonia Vaccine 43+ Years old (1 - PCV) 09/11/2023 (Originally 06/17/2008)   DTaP/Tdap/Td (2 - Td or Tdap) 07/25/2031   DEXA SCAN  Completed   HPV VACCINES  Aged Out   INFLUENZA VACCINE  Discontinued   COLONOSCOPY (Pts 45-69yr Insurance coverage will need to be confirmed)  Discontinued   COVID-19 Vaccine  Discontinued   Hepatitis C Screening  Discontinued   Zoster Vaccines- Shingrix  Discontinued    Physical Exam: Vitals:   12/11/22 1540  BP: 138/80  Pulse: 62  Temp: 97.7 F (36.5 C)  SpO2: 100%  Weight: 148 lb (67.1 kg)  Height: 5' 3.5" (1.613 m)   Body mass index is 25.81 kg/m. Physical Exam Vitals reviewed.  Constitutional:      Appearance: Normal appearance.  HENT:     Right Ear: Tympanic membrane normal.     Left Ear: Tympanic membrane normal.     Nose: Rhinorrhea present.     Mouth/Throat:     Mouth: Mucous membranes are moist.  Eyes:     Pupils: Pupils are equal, round, and reactive to light.  Cardiovascular:     Rate and Rhythm: Normal rate.     Pulses: Normal pulses.  Pulmonary:     Effort: Pulmonary effort is normal.     Breath sounds: Normal breath sounds. No wheezing, rhonchi or rales.  Abdominal:     General: Abdomen is flat.     Palpations: Abdomen is soft.  Neurological:     Mental Status: She is alert and oriented to person, place, and time.     Labs reviewed: Basic Metabolic Panel: Recent Labs    06/13/22 1224 07/31/22 1234 11/11/22 0000  NA 137 137 140  K 4.8 4.0 4.7  CL 101 101 106  CO2 32 28 28*  GLUCOSE 74 89  --   BUN 24* 26* 25*  CREATININE 1.18 1.19 0.9  CALCIUM 9.5 9.3 8.7  TSH 8.37* 6.36* 0.06*   Liver Function  Tests: Recent Labs    06/13/22 1224  AST 20  ALT 11  ALKPHOS 61  BILITOT 0.5  PROT 6.6  ALBUMIN 4.1   No results for input(s): "LIPASE", "AMYLASE" in the last 8760 hours. No results for input(s): "AMMONIA" in the last 8760 hours. CBC: Recent Labs    11/11/22 0000  WBC 4.5  HGB 11.5*  HCT 35*  PLT 288   Lipid Panel: Recent  Labs    06/13/22 1224  CHOL 194  HDL 68.30  LDLCALC 95  TRIG 152.0*  CHOLHDL 3   Lab Results  Component Value Date   HGBA1C 6.0 06/13/2022    Procedures since last visit: No results found.  Assessment/Plan 1. Thyroid disease Most recent TSH slightly low, will plan to decrease dose of levothyroxine to 25 mcg daily and recheck in 6 weeks.   2. Subacute cough Patient has had dry, non-productive cough. Previously some congestion. Unclear if patient is recovering from viral infection. Tested with RVP today. Encourage supportive care with hydration, cough syrup (OTC), and rest. Will defer CXR until >7mosymptoms as postviral cough can persist for this duration. F/u 6 weeks.  3. Stress incontinence IN the setting of resent increase in cough. Discussed kegel exercises. Will disucss further at follow up. If no improvement wil consider urogynecology referral vs pelvic floor PT.   Labs/tests ordered:  * No order type specified * Next appt:  Visit date not found

## 2022-12-13 ENCOUNTER — Encounter: Payer: Self-pay | Admitting: Student

## 2022-12-13 ENCOUNTER — Ambulatory Visit: Payer: Medicare PPO | Admitting: Student

## 2022-12-13 NOTE — Telephone Encounter (Signed)
Patient flu shot has been documented in patients chart.

## 2022-12-16 ENCOUNTER — Telehealth: Payer: Self-pay | Admitting: Student

## 2022-12-16 NOTE — Telephone Encounter (Signed)
You tested positive for RSV. This can remain contagious for ~8 days after start of infection and can continue to spread even after you have recovered. There is no antiviral treatment for this infection at this time. Please wear your mask. Please quarantine for the next 5 days to help prevent the spread of infection to the community.   Manage fever and pain with over-the-counter fever reducers and pain relievers, such as acetaminophen.  Drink enough fluids. It is important for people with RSV infection to drink enough fluids to prevent dehydration (loss of body fluids). If you need additional support for cough, robitussin is appropriate and safe for use.

## 2022-12-16 NOTE — Telephone Encounter (Signed)
Patient notified and agreed.  

## 2022-12-23 DIAGNOSIS — H401131 Primary open-angle glaucoma, bilateral, mild stage: Secondary | ICD-10-CM | POA: Diagnosis not present

## 2022-12-23 DIAGNOSIS — H353131 Nonexudative age-related macular degeneration, bilateral, early dry stage: Secondary | ICD-10-CM | POA: Diagnosis not present

## 2023-01-23 DIAGNOSIS — E079 Disorder of thyroid, unspecified: Secondary | ICD-10-CM | POA: Diagnosis not present

## 2023-01-23 LAB — LIPID PANEL
Cholesterol: 184 (ref 0–200)
HDL: 109 — AB (ref 35–70)
LDL Cholesterol: 59
Triglycerides: 79 (ref 40–160)

## 2023-01-23 LAB — BASIC METABOLIC PANEL
BUN: 10 (ref 4–21)
CO2: 31 — AB (ref 13–22)
Chloride: 104 (ref 99–108)
Creatinine: 0.7 (ref 0.5–1.1)
Glucose: 88
Potassium: 4.3 mEq/L (ref 3.5–5.1)
Sodium: 140 (ref 137–147)

## 2023-01-23 LAB — HEPATIC FUNCTION PANEL
ALT: 17 U/L (ref 7–35)
AST: 23 (ref 13–35)
Alkaline Phosphatase: 56 (ref 25–125)

## 2023-01-23 LAB — TSH: TSH: 6.9 — AB (ref 0.41–5.90)

## 2023-01-23 LAB — CBC: RBC: 4.56 (ref 3.87–5.11)

## 2023-01-23 LAB — CBC AND DIFFERENTIAL
HCT: 41 (ref 36–46)
Hemoglobin: 13.6 (ref 12.0–16.0)
Neutrophils Absolute: 2147
Platelets: 219 10*3/uL (ref 150–400)
WBC: 4.5

## 2023-01-23 LAB — COMPREHENSIVE METABOLIC PANEL
Albumin: 4.3 (ref 3.5–5.0)
Calcium: 9.8 (ref 8.7–10.7)
Globulin: 2.4
eGFR: 93

## 2023-01-24 ENCOUNTER — Encounter: Payer: Self-pay | Admitting: Student

## 2023-01-24 ENCOUNTER — Other Ambulatory Visit: Payer: Self-pay | Admitting: Adult Health

## 2023-01-24 DIAGNOSIS — E079 Disorder of thyroid, unspecified: Secondary | ICD-10-CM

## 2023-01-24 MED ORDER — LEVOTHYROXINE SODIUM 50 MCG PO TABS: 50.0000 ug | ORAL_TABLET | Freq: Every day | ORAL | 3 refills | Status: DC

## 2023-02-19 NOTE — Telephone Encounter (Signed)
Angela Shorter, MD  You5 days ago   VB Please contact Angela Buck and schedule her next lab visit for a lab collection for TSH.      Patient notified and Lab appointment scheduled for 03/10/2023. Lab form filled out and placed in bin at [facility].

## 2023-03-05 DIAGNOSIS — S92512A Displaced fracture of proximal phalanx of left lesser toe(s), initial encounter for closed fracture: Secondary | ICD-10-CM | POA: Diagnosis not present

## 2023-03-10 DIAGNOSIS — E079 Disorder of thyroid, unspecified: Secondary | ICD-10-CM | POA: Diagnosis not present

## 2023-03-10 LAB — TSH: TSH: 4.42 (ref 0.41–5.90)

## 2023-03-11 ENCOUNTER — Encounter: Payer: Self-pay | Admitting: Student

## 2023-03-12 ENCOUNTER — Other Ambulatory Visit: Payer: Self-pay | Admitting: Adult Health

## 2023-03-12 DIAGNOSIS — E079 Disorder of thyroid, unspecified: Secondary | ICD-10-CM

## 2023-03-14 ENCOUNTER — Telehealth: Payer: Medicare PPO | Admitting: *Deleted

## 2023-03-14 NOTE — Telephone Encounter (Signed)
LMOM for patient to return call.

## 2023-03-14 NOTE — Telephone Encounter (Signed)
Patient notified and agreed.  

## 2023-03-14 NOTE — Telephone Encounter (Signed)
-----   Message from Earnestine Mealing, MD sent at 03/14/2023  9:41 AM EDT ----- Please contact patient to inform her that her thyroid level is within goal range at this time.  ----- Message ----- From: Tytianna Greenley A, CMA Sent: 03/11/2023   9:08 AM EDT To: Earnestine Mealing, MD  Abstracted to patient's chart.

## 2023-03-19 DIAGNOSIS — S92512A Displaced fracture of proximal phalanx of left lesser toe(s), initial encounter for closed fracture: Secondary | ICD-10-CM | POA: Diagnosis not present

## 2023-04-16 DIAGNOSIS — S92912D Unspecified fracture of left toe(s), subsequent encounter for fracture with routine healing: Secondary | ICD-10-CM | POA: Diagnosis not present

## 2023-05-01 DIAGNOSIS — Z85828 Personal history of other malignant neoplasm of skin: Secondary | ICD-10-CM | POA: Diagnosis not present

## 2023-05-01 DIAGNOSIS — D2271 Melanocytic nevi of right lower limb, including hip: Secondary | ICD-10-CM | POA: Diagnosis not present

## 2023-05-01 DIAGNOSIS — L570 Actinic keratosis: Secondary | ICD-10-CM | POA: Diagnosis not present

## 2023-05-01 DIAGNOSIS — L578 Other skin changes due to chronic exposure to nonionizing radiation: Secondary | ICD-10-CM | POA: Diagnosis not present

## 2023-05-01 DIAGNOSIS — D239 Other benign neoplasm of skin, unspecified: Secondary | ICD-10-CM | POA: Diagnosis not present

## 2023-05-01 DIAGNOSIS — L821 Other seborrheic keratosis: Secondary | ICD-10-CM | POA: Diagnosis not present

## 2023-05-05 ENCOUNTER — Telehealth: Payer: Self-pay | Admitting: Nurse Practitioner

## 2023-05-05 NOTE — Telephone Encounter (Signed)
Called patient to schedule an AWV but patient declined say she does not want to do it now.

## 2023-05-19 DIAGNOSIS — H401132 Primary open-angle glaucoma, bilateral, moderate stage: Secondary | ICD-10-CM | POA: Diagnosis not present

## 2023-05-19 DIAGNOSIS — H02403 Unspecified ptosis of bilateral eyelids: Secondary | ICD-10-CM | POA: Diagnosis not present

## 2023-05-19 DIAGNOSIS — H04123 Dry eye syndrome of bilateral lacrimal glands: Secondary | ICD-10-CM | POA: Diagnosis not present

## 2023-05-19 DIAGNOSIS — D3132 Benign neoplasm of left choroid: Secondary | ICD-10-CM | POA: Diagnosis not present

## 2023-05-20 ENCOUNTER — Ambulatory Visit (INDEPENDENT_AMBULATORY_CARE_PROVIDER_SITE_OTHER): Payer: Medicare PPO | Admitting: Nurse Practitioner

## 2023-05-20 ENCOUNTER — Encounter: Payer: Self-pay | Admitting: Nurse Practitioner

## 2023-05-20 VITALS — BP 130/82 | HR 60 | Temp 97.2°F | Ht 63.5 in | Wt 149.0 lb

## 2023-05-20 DIAGNOSIS — Z0000 Encounter for general adult medical examination without abnormal findings: Secondary | ICD-10-CM | POA: Diagnosis not present

## 2023-05-20 NOTE — Patient Instructions (Signed)
  Ms. Mukherjee , Thank you for taking time to come for your Medicare Wellness Visit. I appreciate your ongoing commitment to your health goals. Please review the following plan we discussed and let me know if I can assist you in the future.   These are the goals we discussed:  Goals      Continue being active.     Patient Stated     Increase water intake             This is a list of the screening recommended for you and due dates:  Health Maintenance  Topic Date Due   Pneumonia Vaccine (1 of 1 - PCV) 09/11/2023*   Flu Shot  07/03/2023   Medicare Annual Wellness Visit  05/19/2024   DTaP/Tdap/Td vaccine (2 - Td or Tdap) 07/25/2031   DEXA scan (bone density measurement)  Completed   HPV Vaccine  Aged Out   Colon Cancer Screening  Discontinued   COVID-19 Vaccine  Discontinued   Hepatitis C Screening  Discontinued   Zoster (Shingles) Vaccine  Discontinued  *Topic was postponed. The date shown is not the original due date.

## 2023-05-20 NOTE — Progress Notes (Signed)
Subjective:   Angela Buck is a 82 y.o. female who presents for Medicare Annual (Subsequent) preventive examination.  Review of Systems           Objective:    Today's Vitals   05/20/23 0821  BP: 130/82  Pulse: 60  Temp: (!) 97.2 F (36.2 C)  SpO2: 99%  Weight: 149 lb (67.6 kg)  Height: 5' 3.5" (1.613 m)   Body mass index is 25.98 kg/m.     05/20/2023    8:23 AM 12/11/2022    3:42 PM 09/25/2022   12:55 PM 11/01/2021    3:06 PM 04/18/2020    9:37 AM 04/12/2019    2:10 PM 04/09/2018    8:25 AM  Advanced Directives  Does Patient Have a Medical Advance Directive? Yes Yes Yes Yes Yes Yes Yes  Type of Estate agent of [place];Living will Healthcare Power of [employer] of [place];Living will Healthcare Power of [place];Living will Healthcare Power of [place];Living will Healthcare Power of [place];Living will Healthcare Power of [place];Living will  Does patient want to make changes to medical advance directive? No - Patient declined No - Patient declined No - Patient declined  No - Patient declined No - Patient declined   Copy of Healthcare Power of Attorney in Chart? Yes - validated most recent copy scanned in chart (See row information) Yes - validated most recent copy scanned in chart (See row information) No - copy requested Yes - validated most recent copy scanned in chart (See row information) No - copy requested No - copy requested Yes    Current Medications (verified) Outpatient Encounter Medications as of 05/20/2023  Medication Sig   Ascorbic Acid (VITAMIN C) 100 MG tablet Take 500 mg by mouth daily.   latanoprost (XALATAN) 0.005 % ophthalmic solution Place 1 drop into both eyes at bedtime.   levothyroxine (SYNTHROID) 50 MCG tablet TAKE 1 TABLET BY MOUTH DAILY BEFORE BREAKFAST   Misc Natural Products (COLON CARE PO) Take 1 capsule by mouth at bedtime. Colon Max   OVER THE COUNTER MEDICATION Take 1 capsule by mouth daily.  Ioderol   timolol (TIMOPTIC) 0.5 % ophthalmic solution Place 1 drop into both eyes daily.   Vitamin D-Vitamin K (VITAMIN D2 + K1 PO) Take 1 capsule by mouth daily.   No facility-administered encounter medications on file as of 05/20/2023.    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   Leukopenia 05/18/2014   Mass of axilla 01/05/2012   Measles as a child   Medicare annual wellness visit, subsequent 01/05/2012   Sees Dr Loreta Ave of Gastroenterology    Neutropenia (HCC) 10/07/2016   Osteopenia 01/05/2012   Other and unspecified hyperlipidemia 05/18/2014   Pain of left heel 04/07/2017   Preventative health care 01/05/2012   Preventative health care 10/07/2016   Skin cancer 12/02/2009   Thyroid disease 03/16/2012   right    Vitamin D deficiency 04/07/2017   Past Surgical History:  Procedure Laterality Date   biopsy of jawline  2011   CATARACT EXTRACTION, BILATERAL     b/l   laproscopy  1970   abdominal, endometriosis, with D&C   right  hip replacement  2003   TONSILLECTOMY AND ADENOIDECTOMY  1949   Family History  Problem Relation Age of Onset   Arthritis Mother    Hypertension Mother    Dementia Mother    Heart attack Mother    Glaucoma Mother    Heart disease Mother        MI, stent in 2004   Emphysema Father        smoker   Hypertension Father    Glaucoma Father    COPD Father    Osteoporosis Sister        Per [facility]  New Patient packet   Ulcers Maternal Grandmother    Other Paternal Grandfather        enlarged heart   Social History   Socioeconomic History   Marital status: Divorced    Spouse name: Not on file   Number of children: Not on file   Years of education: Not on file   Highest education level:  Not on file  Occupational History   Not on file  Tobacco Use   Smoking status: Never   Smokeless tobacco: Never  Vaping Use   Vaping Use: Never used  Substance and Sexual Activity   Alcohol use: Yes    Comment: occasional wine   Drug use: No   Sexual activity: Never    Partners: Female    Comment: lives with partner, no major dietary restrictions.   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      Family: sister in [place] and Daughter in [place], [place] - 1 grandchild      Enjoys: yoga, pickle ball, travel, the arts      Exercise: daily   Diet: generally healthy      Safety   Seat belts: Yes    Guns: No   Safe in relationships: Yes       Social Determinants of Corporate investment banker Strain: Low Risk  (11/01/2021)   Overall Financial Resource Strain (CARDIA)    Difficulty of Paying Living Expenses: Not hard at all  Food Insecurity: No Food Insecurity (11/01/2021)   Hunger Vital Sign    Worried About Running Out of Food in the Last Year: Never true    Ran Out of Food in the Last Year: Never true  Transportation Needs: No Transportation Needs (11/01/2021)   PRAPARE - Administrator, Civil Service (Medical): No    Lack of Transportation (Non-Medical): No  Physical Activity: Insufficiently Active (11/01/2021)   Exercise Vital Sign    Days of Exercise per Week: 2 days    Minutes of Exercise per Session: 40 min  Stress: No Stress Concern Present (11/01/2021)   [employer] of Occupational Health - Occupational Stress Questionnaire    Feeling of Stress : Only a little  Social Connections: Moderately Integrated (11/01/2021)   Social Connection and Isolation Panel [NHANES]    Frequency of Communication with Friends and Family: More than three times a week    Frequency of Social  Gatherings with Friends and Family: More than three times a week    Attends Religious Services: More than 4 times per year    Active Member of [employer] or Organizations: No    Attends Engineer, structural: More than  4 times per year    Marital Status: Divorced    Tobacco Counseling Counseling given: Not Answered   Clinical Intake:  Pre-visit preparation completed: Yes  Pain : No/denies pain     BMI - recorded: 25 Nutritional Status: BMI 25 -29 Overweight Nutritional Risks: None Diabetes: No  How often do you need to have someone help you when you read instructions, pamphlets, or other written materials from your doctor or pharmacy?: 1 - Never         Activities of Daily Living    05/20/2023    8:24 AM 05/19/2023    2:23 PM  In your present state of health, do you have any difficulty performing the following activities:  Hearing? 0 0  Vision? 0 0  Difficulty concentrating or making decisions? 0 0  Walking or climbing stairs? 0 0  Dressing or bathing? 0 0  Doing errands, shopping? 0 0  Preparing Food and eating ? N N  Using the Toilet? N N  In the past six months, have you accidently leaked urine? N N  Do you have problems with loss of bowel control? N N  Managing your Medications? N N  Managing your Finances? N N  Housekeeping or managing your Housekeeping? N N    Patient Care Team: Earnestine Mealing, MD as PCP - General (Family Medicine) Bradd Canary, MD (Family Medicine) Elmon Else, MD as Consulting Physician (Dermatology) Gorden Harms as Referring Physician (Ophthalmology)  Indicate any recent Medical Services you may have received from other than [facility] providers in the past year (date may be approximate).     Assessment:   This is a routine wellness examination for [place].  Hearing/Vision screen Vision Screening - Comments:: Dr. Marvene Staff Appointment 05/19/2023  Dietary issues and exercise activities discussed:     Goals  Addressed   None    Depression Screen    05/20/2023    8:24 AM 09/25/2022   12:53 PM 09/10/2022    2:48 PM 11/01/2021    3:12 PM 07/24/2021   10:04 AM 04/18/2020    9:41 AM 04/12/2019    2:10 PM  PHQ 2/9 Scores  PHQ - 2 Score 0 0 0 1 0 0 0  PHQ- 9 Score   0        Fall Risk    05/20/2023    8:23 AM 05/19/2023    2:23 PM 09/25/2022   12:53 PM 09/10/2022    2:48 PM 11/01/2021    3:10 PM  Fall Risk   Falls in the past year? 1 0 1 0 0  Number falls in past yr: 0 0 0  0  Injury with Fall? 1 1 0  0  Risk for fall due to :   No Fall Risks No Fall Risks   Follow up   Falls evaluation completed Falls evaluation completed Falls prevention discussed    MEDICARE RISK AT HOME:   TIMED UP AND GO:  Was the test performed?  No    Cognitive Function:        05/20/2023    8:26 AM  6CIT Screen  What Year? 0 points  What month? 0 points  What time? 0 points  Count back from 20 0 points  Months in reverse 0 points  Repeat phrase 0 points  Total Score 0 points    Immunizations Immunization History  Administered Date(s) Administered   Fluad Quad(high Dose 65+) 10/14/2022   Moderna Sars-Covid-2 Vaccination 12/20/2019, 01/17/2020, 10/13/2020, 04/19/2021  Research officer, trade union 14yrs & up 08/23/2021, 04/30/2022   Tdap 07/24/2021    TDAP status: Up to date  Flu Vaccine status: Up to date  Pneumococcal vaccine status: Due, Education has been provided regarding the importance of this vaccine. Advised may receive this vaccine at local pharmacy or Health Dept. Aware to provide a copy of the vaccination record if obtained from local pharmacy or Health Dept. Verbalized acceptance and understanding.  Covid-19 vaccine status: Information provided on how to obtain vaccines.   Qualifies for Shingles Vaccine? Yes   Zostavax completed No   Shingrix Completed?: No.    Education has been provided regarding the importance of this vaccine. Patient has been advised to call  insurance company to determine out of pocket expense if they have not yet received this vaccine. Advised may also receive vaccine at local pharmacy or Health Dept. Verbalized acceptance and understanding.  Screening Tests Health Maintenance  Topic Date Due   Pneumonia Vaccine 36+ Years old (1 of 1 - PCV) 09/11/2023 (Originally 06/17/2008)   INFLUENZA VACCINE  07/03/2023   Medicare Annual Wellness (AWV)  05/19/2024   DTaP/Tdap/Td (2 - Td or Tdap) 07/25/2031   DEXA SCAN  Completed   HPV VACCINES  Aged Out   Colonoscopy  Discontinued   COVID-19 Vaccine  Discontinued   Hepatitis C Screening  Discontinued   Zoster Vaccines- Shingrix  Discontinued    Health Maintenance  There are no preventive care reminders to display for this patient.   Colorectal cancer screening: No longer required.   Mammogram status: No longer required due to aged.  Bone Density status: Completed 04/30/2022. Results reflect: Bone density results: OSTEOPOROSIS. Repeat every 2 years.  Lung Cancer Screening: (Low Dose CT Chest recommended if Age 42-80 years, 20 pack-year currently smoking OR have quit w/in 15years.) does not qualify.   Lung Cancer Screening Referral: na  Additional Screening:  Hepatitis C Screening: does qualify; however declines   Vision Screening: Recommended annual ophthalmology exams for early detection of glaucoma and other disorders of the eye. Is the patient up to date with their annual eye exam?  Yes  Who is the provider or what is the name of the office in which the patient attends annual eye exams? Dr Alex Gardener  If pt is not established with a provider, would they like to be referred to a provider to establish care? No .   Dental Screening: Recommended annual dental exams for proper oral hygiene  Community Resource Referral / Chronic Care Management: CRR required this visit?  No   CCM required this visit?  No     Plan:     I have personally reviewed and noted the following in  the patient's chart:   Medical and social history Use of alcohol, tobacco or illicit drugs  Current medications and supplements including opioid prescriptions. Patient is not currently taking opioid prescriptions. Functional ability and status Nutritional status Physical activity Advanced directives List of other physicians Hospitalizations, surgeries, and ER visits in previous 12 months Vitals Screenings to include cognitive, depression, and falls Referrals and appointments  In addition, I have reviewed and discussed with patient certain preventive protocols, quality metrics, and best practice recommendations. A written personalized care plan for preventive services as well as general preventive health recommendations were provided to patient.     Sharon Seller, NP   05/20/2023 =

## 2023-05-22 ENCOUNTER — Encounter: Payer: Medicare PPO | Admitting: Nurse Practitioner

## 2023-06-20 DIAGNOSIS — H401131 Primary open-angle glaucoma, bilateral, mild stage: Secondary | ICD-10-CM | POA: Diagnosis not present

## 2023-06-20 DIAGNOSIS — H02401 Unspecified ptosis of right eyelid: Secondary | ICD-10-CM | POA: Diagnosis not present

## 2023-06-20 DIAGNOSIS — H04123 Dry eye syndrome of bilateral lacrimal glands: Secondary | ICD-10-CM | POA: Diagnosis not present

## 2023-06-30 ENCOUNTER — Other Ambulatory Visit (HOSPITAL_BASED_OUTPATIENT_CLINIC_OR_DEPARTMENT_OTHER): Payer: Self-pay | Admitting: Student

## 2023-06-30 DIAGNOSIS — Z1231 Encounter for screening mammogram for malignant neoplasm of breast: Secondary | ICD-10-CM

## 2023-07-01 ENCOUNTER — Ambulatory Visit: Payer: Medicare PPO | Admitting: Nurse Practitioner

## 2023-07-01 ENCOUNTER — Encounter: Payer: Self-pay | Admitting: Nurse Practitioner

## 2023-07-01 VITALS — BP 126/78 | HR 58 | Temp 97.3°F | Ht 63.5 in | Wt 151.0 lb

## 2023-07-01 DIAGNOSIS — L539 Erythematous condition, unspecified: Secondary | ICD-10-CM | POA: Diagnosis not present

## 2023-07-01 NOTE — Progress Notes (Signed)
Careteam: Patient Care Team: Earnestine Mealing, MD as PCP - General (Family Medicine) Bradd Canary, MD (Family Medicine) Elmon Else, MD as Consulting Physician (Dermatology) Gorden Harms as Referring Physician (Ophthalmology)  Advanced Directive information Does Patient Have a Medical Advance Directive?: Yes, Type of Advance Directive: Healthcare Power of [place];Living will, Does patient want to make changes to medical advance directive?: No - Patient declined  Allergies  Allergen Reactions   Bee Venom Anaphylaxis   Penicillins Hives    Has patient had a PCN reaction causing immediate rash, facial/tongue/throat swelling, SOB or lightheadedness with hypotension: No Has patient had a PCN reaction causing severe rash involving mucus membranes or skin necrosis: No Has patient had a PCN reaction that required hospitalization No Has patient had a PCN reaction occurring within the last 10 years: No If all of the above answers are "NO", then may proceed with Cephalosporin use.    Chief Complaint  Patient presents with   Acute Visit    Complains of Bites on Buttocks. Been there for about 10 days. Itching has gone away. Just wants to make sure it is not tick bite.      HPI: Patient is a 82 y.o. female seen in today at the [facility] for small areas on buttocks. She had noticed on irratation and itching to the area. Was wam but that has improved.   Review of Systems:  Review of Systems  Constitutional:  Negative for chills, fever, malaise/fatigue and weight loss.  Skin:  Positive for itching and rash.    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   Leukopenia 05/18/2014   Mass of axilla 01/05/2012   Measles as a child   Medicare annual wellness visit,  subsequent 01/05/2012   Sees Dr Loreta Ave of Gastroenterology    Neutropenia (HCC) 10/07/2016   Osteopenia 01/05/2012   Other and unspecified hyperlipidemia 05/18/2014   Pain of left heel 04/07/2017   Preventative health care 01/05/2012   Preventative health care 10/07/2016   Skin cancer 12/02/2009   Thyroid disease 03/16/2012   right    Vitamin D deficiency 04/07/2017   Past Surgical History:  Procedure Laterality Date   biopsy of jawline  2011   CATARACT EXTRACTION, BILATERAL     b/l   laproscopy  1970   abdominal, endometriosis, with D&C   right hip replacement  2003   TONSILLECTOMY AND ADENOIDECTOMY  1949   Social History:   reports that she has never smoked. She has never used smokeless tobacco. She reports current alcohol use. She reports that she does not use drugs.  Family History  Problem Relation Age of Onset   Arthritis Mother    Hypertension Mother    Dementia Mother    Heart attack Mother    Glaucoma Mother    Heart disease Mother        MI, stent in 2004   Emphysema Father        smoker   Hypertension Father    Glaucoma Father    COPD Father    Osteoporosis Sister        Per [facility]  New Patient packet   Ulcers Maternal Grandmother    Other Paternal Grandfather  enlarged heart    Medications: Patient's Medications  New Prescriptions   No medications on file  Previous Medications   ASCORBIC ACID (VITAMIN C) 100 MG TABLET    Take 500 mg by mouth daily.   LATANOPROST (XALATAN) 0.005 % OPHTHALMIC SOLUTION    Place 1 drop into both eyes at bedtime.   LEVOTHYROXINE (SYNTHROID) 50 MCG TABLET    TAKE 1 TABLET BY MOUTH DAILY BEFORE BREAKFAST   MISC NATURAL PRODUCTS (COLON CARE PO)    Take 1 capsule by mouth at bedtime. Colon Max   OVER THE COUNTER MEDICATION    Take 1 capsule by mouth daily. Ioderol   TIMOLOL (TIMOPTIC) 0.5 % OPHTHALMIC SOLUTION    Place 1 drop into both eyes daily.   VITAMIN D-VITAMIN K (VITAMIN D2 + K1 PO)    Take 1 capsule by mouth daily.  Modified  Medications   No medications on file  Discontinued Medications   No medications on file    Physical Exam:  Vitals:   07/01/23 1032  BP: 126/78  Pulse: (!) 58  Temp: (!) 97.3 F (36.3 C)  SpO2: 97%  Weight: 151 lb (68.5 kg)  Height: 5' 3.5" (1.613 m)   Body mass index is 26.33 kg/m. Wt Readings from Last 3 Encounters:  07/01/23 151 lb (68.5 kg)  05/20/23 149 lb (67.6 kg)  12/11/22 148 lb (67.1 kg)    Physical Exam Constitutional:      Appearance: Normal appearance.  Pulmonary:     Effort: Pulmonary effort is normal.  Skin:    Findings: Erythema present.     Comments: 2 small areas noted to right buttock, slight errythema noted, without heat or drainage or pain.   Neurological:     Mental Status: She is alert. Mental status is at baseline.  Psychiatric:        Mood and Affect: Mood normal.     Labs reviewed: Basic Metabolic Panel: Recent Labs    07/31/22 1234 11/11/22 0000 01/23/23 0000 03/10/23 0000  NA 137 140 140  --   K 4.0 4.7 4.3  --   CL 101 106 104  --   CO2 28 28* 31*  --   GLUCOSE 89  --   --   --   BUN 26* 25* 10  --   CREATININE 1.19 0.9 0.7  --   CALCIUM 9.3 8.7 9.8  --   TSH 6.36* 0.06* 6.90* 4.42   Liver Function Tests: Recent Labs    01/23/23 0000  AST 23  ALT 17  ALKPHOS 56  ALBUMIN 4.3   No results for input(s): "LIPASE", "AMYLASE" in the last 8760 hours. No results for input(s): "AMMONIA" in the last 8760 hours. CBC: Recent Labs    11/11/22 0000 01/23/23 0000  WBC 4.5 4.5  NEUTROABS  --  2,147.00  HGB 11.5* 13.6  HCT 35* 41  PLT 288 219   Lipid Panel: Recent Labs    01/23/23 0000  CHOL 184  HDL 109*  LDLCALC 59  TRIG 79   TSH: Recent Labs    11/11/22 0000 01/23/23 0000 03/10/23 0000  TSH 0.06* 6.90* 4.42   A1C: Lab Results  Component Value Date   HGBA1C 6.0 06/13/2022     Assessment/Plan 1. Erythema She had used warm water to help with redness and warmth which was effective.  To continue warm  water or warm epsom salt water soaks twice daily To monitor for increase in redness, heat, drainage or pain  Janene Harvey. Biagio Borg  [facility] [phone]

## 2023-07-10 IMAGING — MG MM DIGITAL SCREENING BILAT W/ TOMO AND CAD
6 of 10 series · 6 of 30 positions shown · non-contrast
Comparison: Previous exam(s).

CLINICAL DATA: Screening.

EXAM:
DIGITAL SCREENING BILATERAL MAMMOGRAM WITH TOMOSYNTHESIS AND CAD
TECHNIQUE: Bilateral screening digital craniocaudal and mediolateral oblique
mammograms were obtained. Bilateral screening digital breast
tomosynthesis was performed. The images were evaluated with
computer-aided detection.

[R XCCL synth-2D]
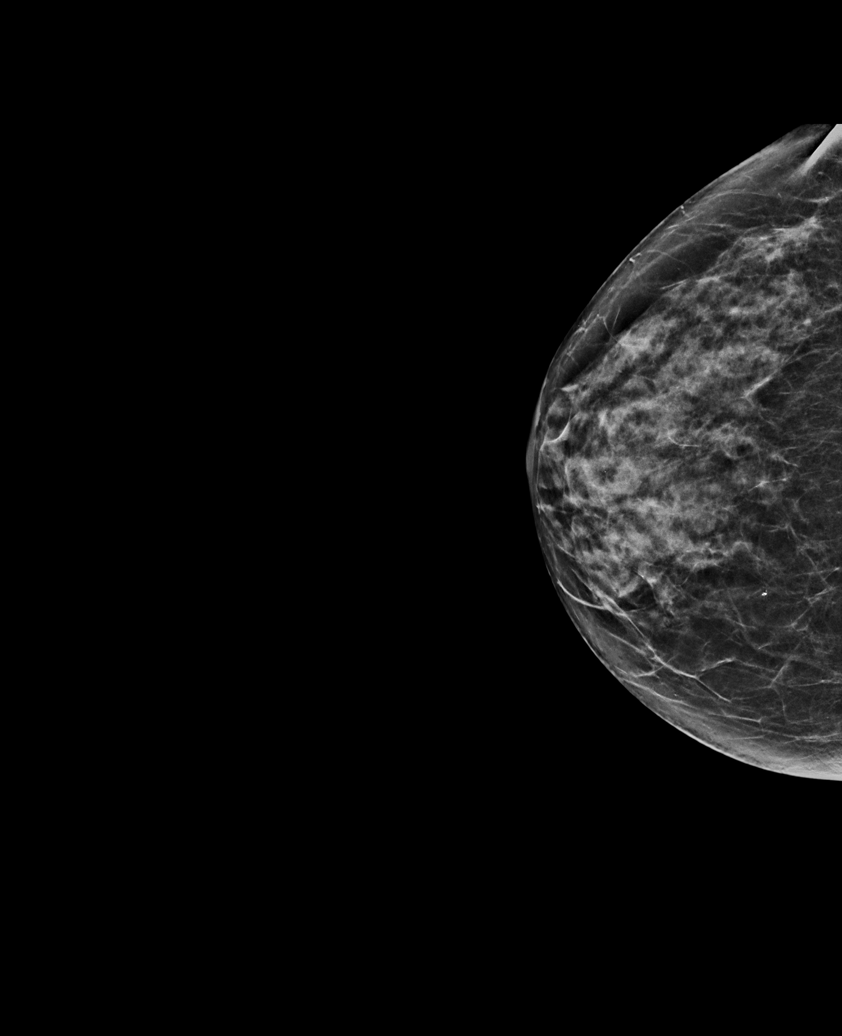

[L CC synth-2D]
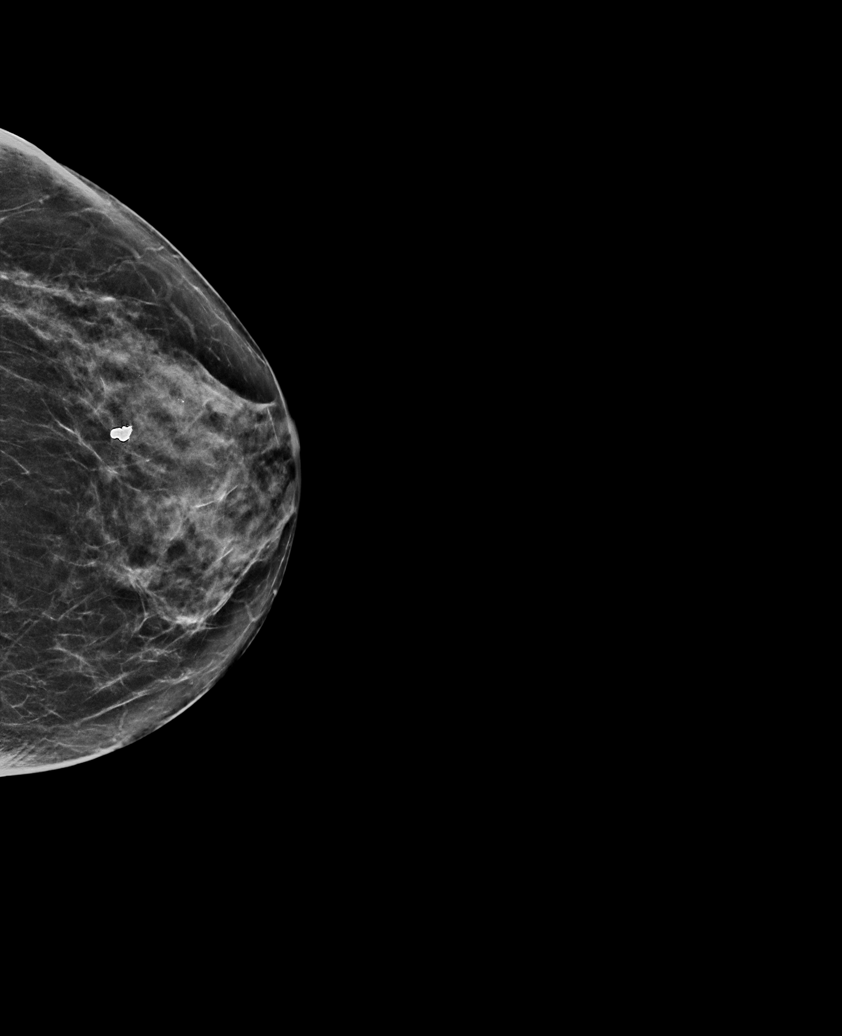

[L MLO synth-2D]
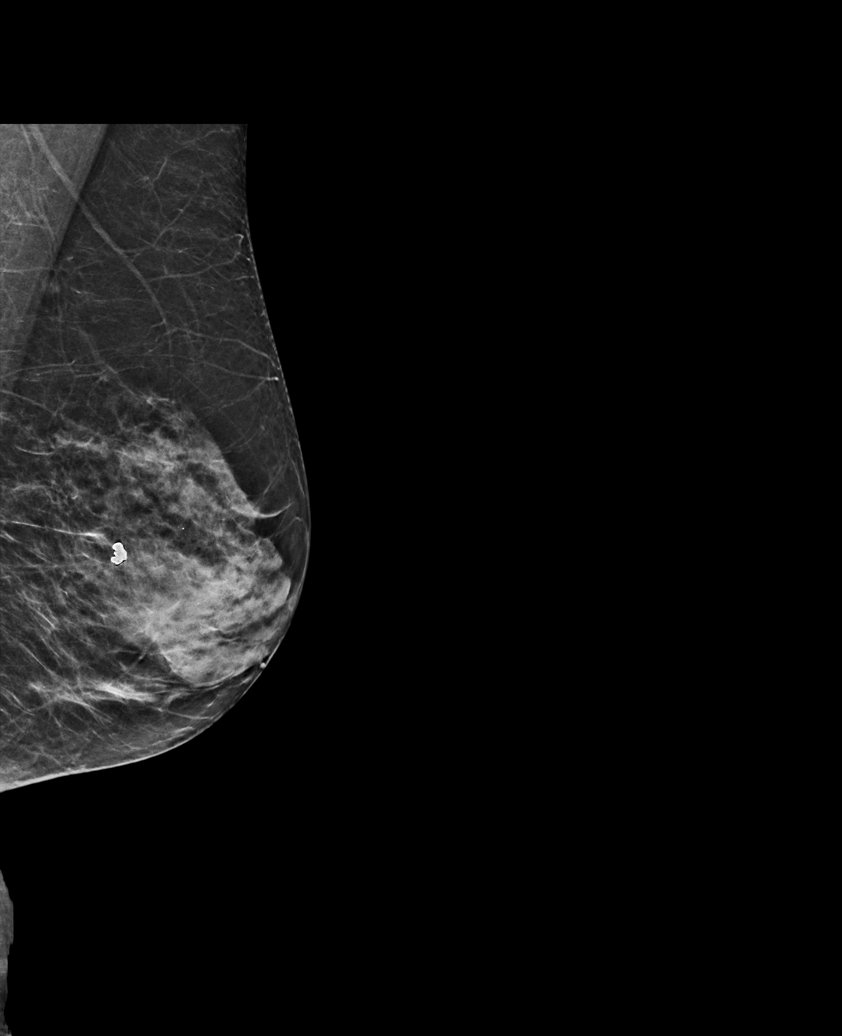

[R MLO synth-2D]
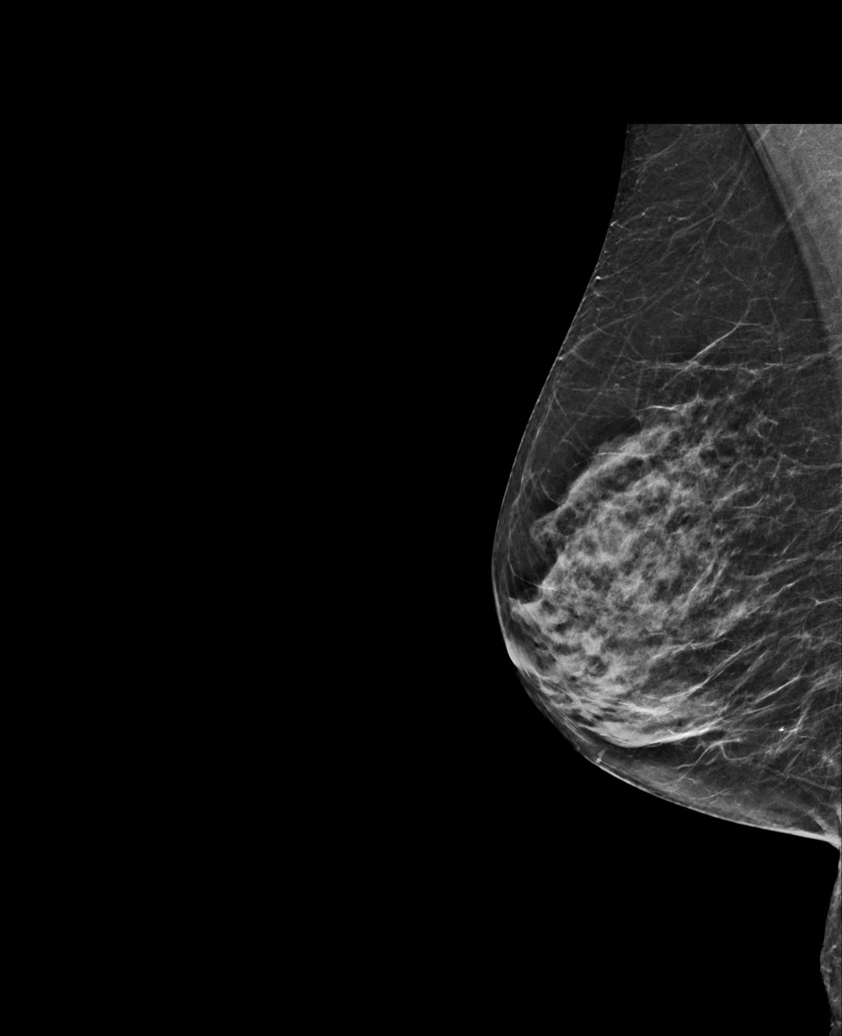

[R CC synth-2D]
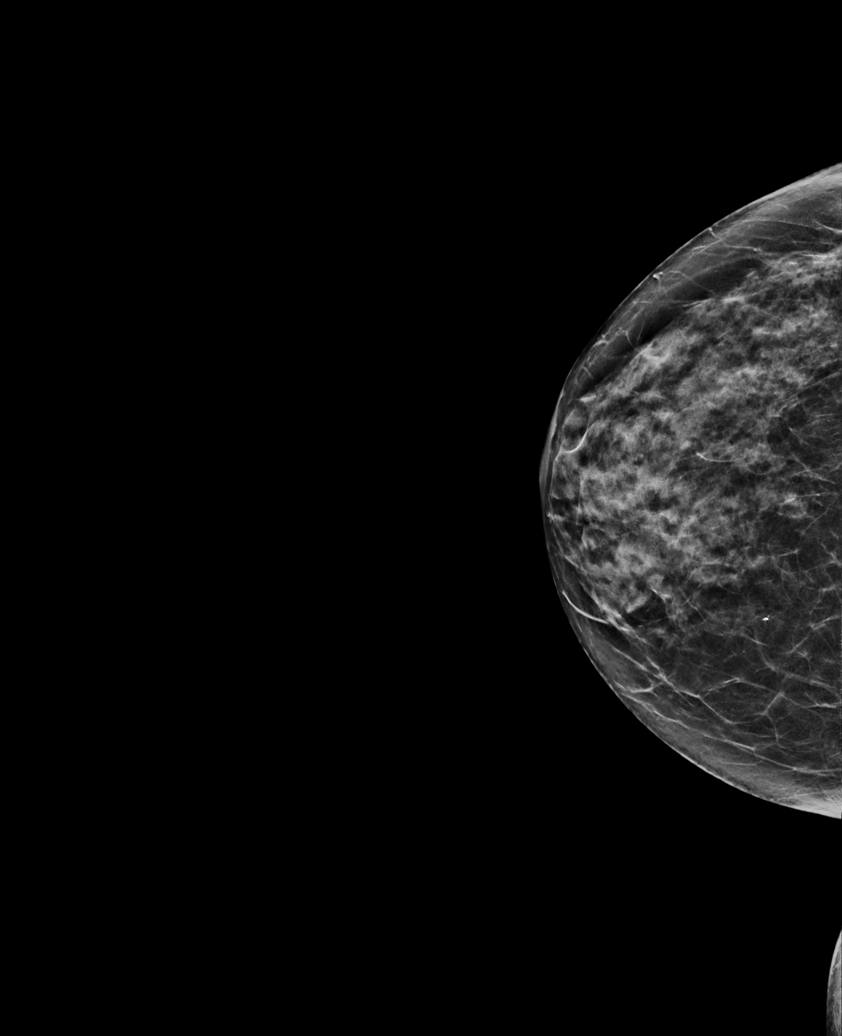

[R XCCL tomo · tomo slice 29/58.0]
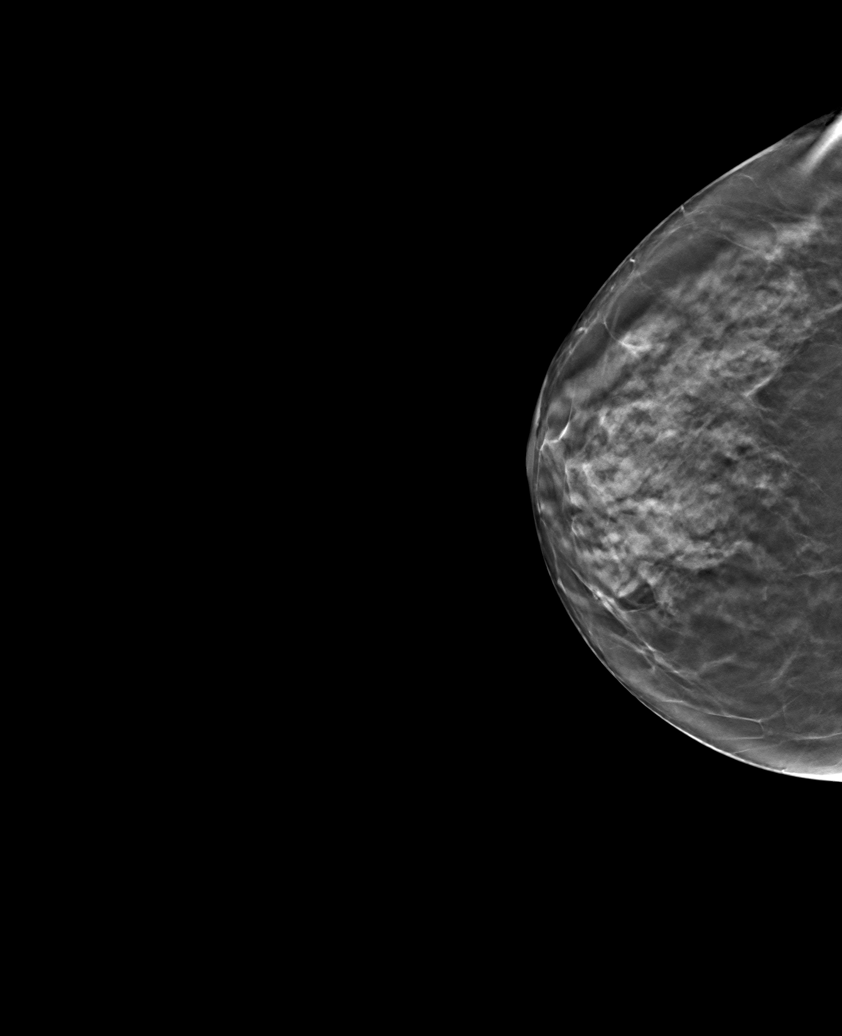

[6 of 30 positions shown; findings below may reference images not displayed]

ACR Breast Density Category c: The breast tissue is heterogeneously
dense, which may obscure small masses.
FINDINGS: There are no findings suspicious for malignancy.
IMPRESSION: No mammographic evidence of malignancy. A result letter of this
screening mammogram will be mailed directly to the patient.

RECOMMENDATION:
Screening mammogram in one year. (Code:Q3-W-BC3)

BI-RADS CATEGORY  1: Negative.

## 2023-07-15 ENCOUNTER — Ambulatory Visit (HOSPITAL_BASED_OUTPATIENT_CLINIC_OR_DEPARTMENT_OTHER)
Admission: RE | Admit: 2023-07-15 | Discharge: 2023-07-15 | Disposition: A | Discharge status: Home / Self Care | Payer: Medicare PPO | Source: Ambulatory Visit | Attending: Student | Admitting: Student

## 2023-07-15 ENCOUNTER — Encounter (HOSPITAL_BASED_OUTPATIENT_CLINIC_OR_DEPARTMENT_OTHER): Payer: Self-pay

## 2023-07-15 DIAGNOSIS — Z1231 Encounter for screening mammogram for malignant neoplasm of breast: Secondary | ICD-10-CM | POA: Diagnosis not present

## 2023-07-17 ENCOUNTER — Encounter: Payer: Self-pay | Admitting: Student

## 2023-07-17 ENCOUNTER — Other Ambulatory Visit: Payer: Self-pay | Admitting: Student

## 2023-07-17 DIAGNOSIS — E079 Disorder of thyroid, unspecified: Secondary | ICD-10-CM

## 2023-08-02 ENCOUNTER — Encounter: Payer: Self-pay | Admitting: Student

## 2023-08-05 ENCOUNTER — Encounter: Payer: Self-pay | Admitting: Student

## 2023-08-05 MED ORDER — EPINEPHRINE 0.3 MG/0.3ML IJ SOAJ: 0.3000 mg | INTRAMUSCULAR | 2 refills | Status: AC | PRN

## 2023-10-07 ENCOUNTER — Encounter: Payer: Self-pay | Admitting: Nurse Practitioner

## 2023-10-07 ENCOUNTER — Other Ambulatory Visit: Payer: Self-pay | Admitting: Nurse Practitioner

## 2023-10-07 ENCOUNTER — Ambulatory Visit: Payer: Medicare PPO | Admitting: Nurse Practitioner

## 2023-10-07 VITALS — BP 128/72 | HR 57 | Temp 97.3°F | Ht 63.5 in | Wt 151.0 lb

## 2023-10-07 DIAGNOSIS — E039 Hypothyroidism, unspecified: Secondary | ICD-10-CM

## 2023-10-07 MED ORDER — THYROID 15 MG PO TABS
ORAL_TABLET | ORAL | 1 refills | Status: DC
Start: 2023-10-07 — End: 2023-10-07

## 2023-10-07 NOTE — Progress Notes (Signed)
Careteam: Patient Care Team: Earnestine Mealing, MD as PCP - General (Family Medicine) Bradd Canary, MD (Family Medicine) Elmon Else, MD as Consulting Physician (Dermatology) Gorden Harms as Referring Physician (Ophthalmology)  Advanced Directive information Does Patient Have a Medical Advance Directive?: Yes, Type of Advance Directive: Healthcare Power of [place];Living will, Does patient want to make changes to medical advance directive?: No - Patient declined  Allergies  Allergen Reactions   Bee Venom Anaphylaxis   Penicillins Hives    Has patient had a PCN reaction causing immediate rash, facial/tongue/throat swelling, SOB or lightheadedness with hypotension: No Has patient had a PCN reaction causing severe rash involving mucus membranes or skin necrosis: No Has patient had a PCN reaction that required hospitalization No Has patient had a PCN reaction occurring within the last 10 years: No If all of the above answers are "NO", then may proceed with Cephalosporin use.    Chief Complaint  Patient presents with   Acute Visit    Requesting to go back to Armour Thyroid instead of Levothyroxine. Feels like body responds better with Armour Thyroid. Patient understands that her insurance does not cover the Armour Thyroid but she is willing to pay out of pocket.    Quality Metric Gaps    To discuss need for Flu and Pneumonia.      HPI: Patient is a 82 y.o. female seen in today at the [facility] due to thyroid medication.  She has had no issue with armour thyroid but insurance did not pay for it.  She feel like her body responds better to armour  Her homeopathic doctor told her more at risk for osteoporosis being on levothyroxine.  She has her armour thyroid 15 mg daily dose in clinic today- was taking 2 tablets daily 4 days a week and 1 tablet daily 3 days a week- reports she was on this dose for several years and stable.     Review of Systems:  Review of Systems   Constitutional:  Negative for chills, fever and weight loss.  HENT:  Negative for tinnitus.   Respiratory:  Negative for cough, sputum production and shortness of breath.   Cardiovascular:  Negative for chest pain, palpitations and leg swelling.  Gastrointestinal:  Negative for abdominal pain, constipation, diarrhea and heartburn.  Genitourinary:  Negative for dysuria, frequency and urgency.  Musculoskeletal:  Positive for joint pain. Negative for myalgias.  Skin: Negative.   Neurological:  Negative for dizziness and headaches.  Psychiatric/Behavioral:  Negative for depression and memory loss. The patient does not have insomnia.     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   Leukopenia 05/18/2014   Mass of axilla 01/05/2012   Measles as a child   Medicare annual wellness visit, subsequent 01/05/2012   Sees Dr Loreta Ave of Gastroenterology    Neutropenia (HCC) 10/07/2016   Osteopenia 01/05/2012   Other and unspecified hyperlipidemia 05/18/2014   Pain of left heel 04/07/2017   Preventative health care 01/05/2012   Preventative health care 10/07/2016   Skin cancer 12/02/2009   Thyroid disease 03/16/2012   right    Vitamin D deficiency 04/07/2017   Past Surgical History:  Procedure Laterality Date   biopsy of jawline  2011   CATARACT EXTRACTION, BILATERAL     b/l   laproscopy  1970   abdominal, endometriosis, with D&C   right hip replacement  2003   TONSILLECTOMY AND ADENOIDECTOMY  1949   Social History:   reports that she has never smoked. She has never used smokeless tobacco. She reports current alcohol use. She reports that she does not use drugs.  Family History  Problem Relation Age of Onset   Arthritis Mother    Hypertension Mother    Dementia Mother    Heart attack  Mother    Glaucoma Mother    Heart disease Mother        MI, stent in 2004   Emphysema Father        smoker   Hypertension Father    Glaucoma Father    COPD Father    Osteoporosis Sister        Per [facility]  New Patient packet   Ulcers Maternal Grandmother    Other Paternal Grandfather        enlarged heart    Medications: Patient's Medications  New Prescriptions   No medications on file  Previous Medications   ASCORBIC ACID (VITAMIN C) 100 MG TABLET    Take 500 mg by mouth daily.   EPINEPHRINE 0.3 MG/0.3 ML IJ SOAJ INJECTION    Inject 0.3 mg into the muscle as needed for anaphylaxis.   LATANOPROST (XALATAN) 0.005 % OPHTHALMIC SOLUTION    Place 1 drop into both eyes at bedtime.   LEVOTHYROXINE (SYNTHROID) 50 MCG TABLET    TAKE 1 TABLET BY MOUTH EVERY DAY BEFORE BREAKFAST   MISC NATURAL PRODUCTS (COLON CARE PO)    Take 1 capsule by mouth at bedtime. Colon Max   OVER THE COUNTER MEDICATION    Take 1 capsule by mouth daily. Ioderol   TIMOLOL (TIMOPTIC) 0.5 % OPHTHALMIC SOLUTION    Place 1 drop into both eyes daily.   VITAMIN D-VITAMIN K (VITAMIN D2 + K1 PO)    Take 1 capsule by mouth daily.  Modified Medications   No medications on file  Discontinued Medications   No medications on file    Physical Exam:  Vitals:   10/07/23 0837  BP: 128/72  Pulse: (!) 57  Temp: (!) 97.3 F (36.3 C)  SpO2: 99%  Weight: 151 lb (68.5 kg)  Height: 5' 3.5" (1.613 m)   Body mass index is 26.33 kg/m. Wt Readings from Last 3 Encounters:  10/07/23 151 lb (68.5 kg)  07/01/23 151 lb (68.5 kg)  05/20/23 149 lb (67.6 kg)    Physical Exam Constitutional:      Appearance: Normal appearance.  Cardiovascular:     Rate and Rhythm: Normal rate and regular rhythm.     Pulses: Normal pulses.     Heart sounds: Normal heart sounds.  Pulmonary:     Effort: Pulmonary effort is normal.     Breath sounds: Normal breath sounds.  Neurological:     Mental Status: She is alert. Mental status is at  baseline.  Psychiatric:        Mood and Affect: Mood normal.     Labs reviewed: Basic Metabolic Panel: Recent Labs    11/11/22 0000 01/23/23 0000 03/10/23 0000  NA 140 140  --   K 4.7 4.3  --   CL 106 104  --   CO2 28* 31*  --   BUN 25* 10  --   CREATININE 0.9 0.7  --  CALCIUM 8.7 9.8  --   TSH 0.06* 6.90* 4.42   Liver Function Tests: Recent Labs    01/23/23 0000  AST 23  ALT 17  ALKPHOS 56  ALBUMIN 4.3   No results for input(s): "LIPASE", "AMYLASE" in the last 8760 hours. No results for input(s): "AMMONIA" in the last 8760 hours. CBC: Recent Labs    11/11/22 0000 01/23/23 0000  WBC 4.5 4.5  NEUTROABS  --  2,147.00  HGB 11.5* 13.6  HCT 35* 41  PLT 288 219   Lipid Panel: Recent Labs    01/23/23 0000  CHOL 184  HDL 109*  LDLCALC 59  TRIG 79   TSH: Recent Labs    11/11/22 0000 01/23/23 0000 03/10/23 0000  TSH 0.06* 6.90* 4.42   A1C: Lab Results  Component Value Date   HGBA1C 6.0 06/13/2022     Assessment/Plan 1. Acquired hypothyroidism Pt wants to change from levothyroxine to armour thyroid. Rx sent to pharmacy  - thyroid (ARMOUR THYROID) 15 MG tablet; 2 tablets daily by mouth 4 days a week and 1 tablet daily 3 days a week  Dispense: 120 tablet; Refill: 1 -she will follow up with Dr Sydnee Cabal in 6 weeks for routine follow up and can follow up on TSH (with any additional labs) at that time.   Janene Harvey. Biagio Borg  [facility] [phone]

## 2023-10-07 NOTE — Telephone Encounter (Signed)
Pharmacy comment: Alternative Requested.

## 2023-10-15 ENCOUNTER — Encounter: Payer: Self-pay | Admitting: Nurse Practitioner

## 2023-10-28 ENCOUNTER — Telehealth: Payer: Medicare PPO

## 2023-10-28 ENCOUNTER — Other Ambulatory Visit: Payer: Self-pay | Admitting: Student

## 2023-10-28 DIAGNOSIS — E079 Disorder of thyroid, unspecified: Secondary | ICD-10-CM

## 2023-10-28 MED ORDER — THYROID 15 MG PO TABS
15.0000 mg | ORAL_TABLET | Freq: Every day | ORAL | 3 refills | Status: DC
Start: 2023-10-28 — End: 2023-12-19

## 2023-10-28 NOTE — Telephone Encounter (Signed)
Patient is calling again in regards to her Armor Thyroid prescription please see messages below.

## 2023-10-28 NOTE — Progress Notes (Signed)
Patient preference for Armour Thyroid. D/c Levothyroxine. TSH in 6 weeks.

## 2023-11-13 DIAGNOSIS — H02403 Unspecified ptosis of bilateral eyelids: Secondary | ICD-10-CM | POA: Diagnosis not present

## 2023-11-13 DIAGNOSIS — H401131 Primary open-angle glaucoma, bilateral, mild stage: Secondary | ICD-10-CM | POA: Diagnosis not present

## 2023-11-19 ENCOUNTER — Ambulatory Visit: Payer: Medicare PPO | Admitting: Student

## 2023-11-20 DIAGNOSIS — L570 Actinic keratosis: Secondary | ICD-10-CM | POA: Diagnosis not present

## 2023-11-20 DIAGNOSIS — L578 Other skin changes due to chronic exposure to nonionizing radiation: Secondary | ICD-10-CM | POA: Diagnosis not present

## 2023-11-20 DIAGNOSIS — D2271 Melanocytic nevi of right lower limb, including hip: Secondary | ICD-10-CM | POA: Diagnosis not present

## 2023-11-20 DIAGNOSIS — L821 Other seborrheic keratosis: Secondary | ICD-10-CM | POA: Diagnosis not present

## 2023-11-20 DIAGNOSIS — Z85828 Personal history of other malignant neoplasm of skin: Secondary | ICD-10-CM | POA: Diagnosis not present

## 2023-11-20 DIAGNOSIS — D225 Melanocytic nevi of trunk: Secondary | ICD-10-CM | POA: Diagnosis not present

## 2023-12-15 ENCOUNTER — Encounter: Payer: Self-pay | Admitting: Student

## 2023-12-15 DIAGNOSIS — E559 Vitamin D deficiency, unspecified: Secondary | ICD-10-CM

## 2023-12-15 DIAGNOSIS — N1831 Chronic kidney disease, stage 3a: Secondary | ICD-10-CM

## 2023-12-18 DIAGNOSIS — N1831 Chronic kidney disease, stage 3a: Secondary | ICD-10-CM | POA: Diagnosis not present

## 2023-12-18 DIAGNOSIS — E559 Vitamin D deficiency, unspecified: Secondary | ICD-10-CM | POA: Diagnosis not present

## 2023-12-18 LAB — CBC WITH DIFFERENTIAL/PLATELET
Absolute Lymphocytes: 1470 {cells}/uL (ref 850–3900)
Absolute Monocytes: 361 {cells}/uL (ref 200–950)
Basophils Absolute: 42 {cells}/uL (ref 0–200)
Basophils Relative: 1 %
Eosinophils Absolute: 172 {cells}/uL (ref 15–500)
Eosinophils Relative: 4.1 %
HCT: 38.9 % (ref 35.0–45.0)
Hemoglobin: 12.3 g/dL (ref 11.7–15.5)
MCH: 29.8 pg (ref 27.0–33.0)
MCHC: 31.6 g/dL — ABNORMAL LOW (ref 32.0–36.0)
MCV: 94.2 fL (ref 80.0–100.0)
MPV: 9.6 fL (ref 7.5–12.5)
Monocytes Relative: 8.6 %
Neutro Abs: 2155 {cells}/uL (ref 1500–7800)
Neutrophils Relative %: 51.3 %
Platelets: 312 10*3/uL (ref 140–400)
RBC: 4.13 10*6/uL (ref 3.80–5.10)
RDW: 12.3 % (ref 11.0–15.0)
Total Lymphocyte: 35 %
WBC: 4.2 10*3/uL (ref 3.8–10.8)

## 2023-12-18 LAB — COMPLETE METABOLIC PANEL WITH GFR
AG Ratio: 1.7 (calc) (ref 1.0–2.5)
ALT: 13 U/L (ref 6–29)
AST: 22 U/L (ref 10–35)
Albumin: 4 g/dL (ref 3.6–5.1)
Alkaline phosphatase (APISO): 65 U/L (ref 37–153)
BUN/Creatinine Ratio: 19 (calc) (ref 6–22)
BUN: 20 mg/dL (ref 7–25)
CO2: 28 mmol/L (ref 20–32)
Calcium: 9.1 mg/dL (ref 8.6–10.4)
Chloride: 104 mmol/L (ref 98–110)
Creat: 1.04 mg/dL — ABNORMAL HIGH (ref 0.60–0.95)
Globulin: 2.3 g/dL (ref 1.9–3.7)
Glucose, Bld: 80 mg/dL (ref 65–99)
Potassium: 4.8 mmol/L (ref 3.5–5.3)
Sodium: 140 mmol/L (ref 135–146)
Total Bilirubin: 0.5 mg/dL (ref 0.2–1.2)
Total Protein: 6.3 g/dL (ref 6.1–8.1)
eGFR: 54 mL/min/{1.73_m2} — ABNORMAL LOW (ref >=60)

## 2023-12-18 LAB — TSH: TSH: 10.55 m[IU]/L — ABNORMAL HIGH (ref 0.40–4.50)

## 2023-12-18 LAB — VITAMIN D 25 HYDROXY (VIT D DEFICIENCY, FRACTURES): Vit D, 25-Hydroxy: 45 ng/mL (ref 30–100)

## 2023-12-19 ENCOUNTER — Encounter: Payer: Self-pay | Admitting: *Deleted

## 2023-12-19 ENCOUNTER — Ambulatory Visit: Payer: Medicare PPO | Admitting: Student

## 2023-12-19 ENCOUNTER — Encounter: Payer: Self-pay | Admitting: Student

## 2023-12-19 VITALS — BP 136/78 | HR 65 | Temp 97.4°F | Ht 63.5 in | Wt 151.0 lb

## 2023-12-19 DIAGNOSIS — N1831 Chronic kidney disease, stage 3a: Secondary | ICD-10-CM

## 2023-12-19 DIAGNOSIS — E079 Disorder of thyroid, unspecified: Secondary | ICD-10-CM | POA: Diagnosis not present

## 2023-12-19 MED ORDER — THYROID 30 MG PO TABS: 30.0000 mg | ORAL_TABLET | Freq: Every day | ORAL | 3 refills | Status: DC

## 2023-12-19 NOTE — Progress Notes (Signed)
Location:  [facility] [place]  Provider: Dr. Earnestine Mealing  Code Status: Full Code Goals of Care:     12/19/2023    3:51 PM  Advanced Directives  Does Patient Have a Medical Advance Directive? Yes  Type of Estate agent of [place];Living will  Does patient want to make changes to medical advance directive? No - Patient declined  Copy of Healthcare Power of Attorney in Chart? Yes - validated most recent copy scanned in chart (See row information)     Chief Complaint  Patient presents with   Medical Management of Chronic Issues    Medical Management of Chronic Issues. Discuss Thyroid labwork.    Quality Metric Gaps    To discuss need for Flu and Pneumonia.     HPI: Patient is a 83 y.o. female seen today for medical management of chronic diseases.  To discuss Thyroid labwork.    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   Leukopenia 05/18/2014   Mass of axilla 01/05/2012   Measles as a child   Medicare annual wellness visit, subsequent 01/05/2012   Sees Dr Loreta Ave of Gastroenterology    Neutropenia (HCC) 10/07/2016   Osteopenia 01/05/2012   Other and unspecified hyperlipidemia 05/18/2014   Pain of left heel 04/07/2017   Preventative health care 01/05/2012   Preventative health care 10/07/2016   Skin cancer 12/02/2009   Thyroid disease 03/16/2012   right    Vitamin D deficiency 04/07/2017    Past Surgical History:  Procedure Laterality Date   biopsy of jawline  2011   CATARACT EXTRACTION, BILATERAL     b/l   laproscopy  1970   abdominal, endometriosis, with D&C   right hip replacement  2003   TONSILLECTOMY AND ADENOIDECTOMY  1949    Allergies  Allergen Reactions   Bee Venom Anaphylaxis   Penicillamine Other (See Comments)    Penicillins Hives    Has patient had a PCN reaction causing immediate rash, facial/tongue/throat swelling, SOB or lightheadedness with hypotension: No Has patient had a PCN reaction causing severe rash involving mucus membranes or skin necrosis: No Has patient had a PCN reaction that required hospitalization No Has patient had a PCN reaction occurring within the last 10 years: No If all of the above answers are "NO", then may proceed with Cephalosporin use.    Outpatient Encounter Medications as of 12/19/2023  Medication Sig   Ascorbic Acid (VITAMIN C) 100 MG tablet Take 500 mg by mouth daily.   EPINEPHrine 0.3 mg/0.3 mL IJ SOAJ injection Inject 0.3 mg into the muscle as needed for anaphylaxis.   latanoprost (XALATAN) 0.005 % ophthalmic solution Place 1 drop into both eyes at bedtime.   Misc Natural Products (COLON CARE PO) Take 1 capsule by mouth at bedtime. Colon Max   OVER THE COUNTER MEDICATION Take 1 capsule by mouth daily. Ioderol   thyroid (ARMOUR THYROID) 15 MG tablet Take 1 tablet (15 mg total) by mouth daily. (Patient taking differently: Take 30mg  by mouth Mon, Wed, Friday and Sunday and 15mg  Tues, Thurs and Saturday.)   timolol (TIMOPTIC) 0.5 % ophthalmic solution Place 1 drop into both eyes daily.   Vitamin D-Vitamin K (VITAMIN D2 + K1 PO)  Take 1 capsule by mouth daily.   No facility-administered encounter medications on file as of 12/19/2023.    Review of Systems:  Review of Systems  Health Maintenance  Topic Date Due   Pneumonia Vaccine 69+ Years old (1 of 2 - PCV) Never done   INFLUENZA VACCINE  07/03/2023   Medicare Annual Wellness (AWV)  05/19/2024   DTaP/Tdap/Td (2 - Td or Tdap) 07/25/2031   DEXA SCAN  Completed   HPV VACCINES  Aged Out   Colonoscopy  Discontinued   COVID-19 Vaccine  Discontinued   Zoster Vaccines- Shingrix  Discontinued    Physical Exam: Vitals:   12/19/23 1547  BP: 136/78  Pulse: 65  Temp: (!) 97.4 F (36.3 C)  SpO2: 100%  Weight: 151  lb (68.5 kg)  Height: 5' 3.5" (1.613 m)   Body mass index is 26.33 kg/m. Physical Exam  Labs reviewed: Basic Metabolic Panel: Recent Labs    01/23/23 0000 03/10/23 0000 12/18/23 0735  NA 140  --  140  K 4.3  --  4.8  CL 104  --  104  CO2 31*  --  28  GLUCOSE  --   --  80  BUN 10  --  20  CREATININE 0.7  --  1.04*  CALCIUM 9.8  --  9.1  TSH 6.90* 4.42 10.55*   Liver Function Tests: Recent Labs    01/23/23 0000 12/18/23 0735  AST 23 22  ALT 17 13  ALKPHOS 56  --   BILITOT  --  0.5  PROT  --  6.3  ALBUMIN 4.3  --    No results for input(s): "LIPASE", "AMYLASE" in the last 8760 hours. No results for input(s): "AMMONIA" in the last 8760 hours. CBC: Recent Labs    01/23/23 0000 12/18/23 0735  WBC 4.5 4.2  NEUTROABS 2,147.00 2,155  HGB 13.6 12.3  HCT 41 38.9  MCV  --  94.2  PLT 219 312   Lipid Panel: Recent Labs    01/23/23 0000  CHOL 184  HDL 109*  LDLCALC 59  TRIG 79   Lab Results  Component Value Date   HGBA1C 6.0 06/13/2022    Procedures since last visit: No results found.  Assessment/Plan There are no diagnoses linked to this encounter.   Labs/tests ordered:  * No order type specified * Next appt:  05/25/2024

## 2023-12-19 NOTE — Patient Instructions (Addendum)
   In contrast to w?men with complete urinary retention, most ?omen will be able to void some urine. This incomplete emptying, or partial voiding, results in urine accumulation in the bladder and the potential sequelae associated with urinary retention.   Depending on the degree of retention and associated symptoms, these ??m?? can be taught clean intermittent catheterization. Scheduled bladder drainage minimizes the risk of detrusor (bladder muscle) injury from overdistention and prevents transmission of high bladder pressures to the upper tract.   Please consider going to the rest room every 4 hours on a schedule.  ________ VISIT SUMMARY:  During today's visit, we discussed several health concerns including your thyroid management, hydration status, kidney function, urinary habits, and skin lesions. We reviewed your current medications and made some adjustments to better control your conditions. We also discussed the importance of hydration and scheduled a follow-up to monitor your progress.  YOUR PLAN:  -CHRONIC KIDNEY DISEASE STAGE 3A: Chronic Kidney Disease Stage 3A means your kidneys are not functioning at full capacity. We need to monitor your kidney function closely and ensure you stay well-hydrated. We will recheck your kidney function in 6-8 weeks.  -DEHYDRATION: Dehydration occurs when your body does not have enough water. This can affect your kidney function and overall health. Please increase your water intake to at least four bottles per day, and five bottles on days when you are physically active. We will recheck your creatinine levels in 6-8 weeks.  -HYPOTHYROIDISM: Hypothyroidism is when your thyroid gland does not produce enough thyroid hormone. Your current dosage of Armour Thyroid is not adequately controlling your thyroid levels. We will increase your dosage to 30 mg daily and recheck your thyroid levels in 6-8 weeks.  -URINARY INCONTINENCE: Urinary incontinence is the loss  of bladder control, leading to occasional leakage. We recommend bladder training and scheduled toileting every four hours. Also, please increase your fluid intake.  -SKIN LESIONS: You have recurring and new skin lesions that need further evaluation. We recommend you follow up with your dermatologist for a thorough evaluation and possible biopsy.  -GENERAL HEALTH MAINTENANCE: Your blood pressure is well-controlled, and your routine blood tests are normal. Your vitamin D level is adequate, so please continue your current vitamin D supplementation and regular blood pressure monitoring.  INSTRUCTIONS:  Please follow up in 6-8 weeks for recheck of your TSH, creatinine, and eGFR levels. Additionally, schedule an appointment with your dermatologist for evaluation of your skin lesions.

## 2023-12-20 ENCOUNTER — Other Ambulatory Visit: Payer: Self-pay | Admitting: Student

## 2023-12-20 DIAGNOSIS — E079 Disorder of thyroid, unspecified: Secondary | ICD-10-CM

## 2023-12-22 NOTE — Telephone Encounter (Signed)
See comment from pharmacy about selecting an alternative

## 2024-02-02 DIAGNOSIS — N1831 Chronic kidney disease, stage 3a: Secondary | ICD-10-CM | POA: Diagnosis not present

## 2024-02-02 DIAGNOSIS — E079 Disorder of thyroid, unspecified: Secondary | ICD-10-CM | POA: Diagnosis not present

## 2024-02-03 LAB — BASIC METABOLIC PANEL WITH GFR
BUN/Creatinine Ratio: 14 (calc) (ref 6–22)
BUN: 14 mg/dL (ref 7–25)
CO2: 31 mmol/L (ref 20–32)
Calcium: 9.1 mg/dL (ref 8.6–10.4)
Chloride: 102 mmol/L (ref 98–110)
Creat: 0.97 mg/dL — ABNORMAL HIGH (ref 0.60–0.95)
Glucose, Bld: 85 mg/dL (ref 65–99)
Potassium: 4.5 mmol/L (ref 3.5–5.3)
Sodium: 136 mmol/L (ref 135–146)
eGFR: 59 mL/min/{1.73_m2} — ABNORMAL LOW (ref >=60)

## 2024-02-03 LAB — TSH: TSH: 11.76 m[IU]/L — ABNORMAL HIGH (ref 0.40–4.50)

## 2024-02-05 ENCOUNTER — Encounter: Payer: Self-pay | Admitting: Student

## 2024-02-06 ENCOUNTER — Ambulatory Visit: Payer: Medicare PPO | Admitting: Student

## 2024-02-06 ENCOUNTER — Encounter: Payer: Self-pay | Admitting: Student

## 2024-02-06 VITALS — BP 118/74 | HR 56 | Temp 97.6°F | Ht 63.5 in | Wt 152.0 lb

## 2024-02-06 DIAGNOSIS — N1831 Chronic kidney disease, stage 3a: Secondary | ICD-10-CM | POA: Diagnosis not present

## 2024-02-06 DIAGNOSIS — E039 Hypothyroidism, unspecified: Secondary | ICD-10-CM | POA: Diagnosis not present

## 2024-02-06 DIAGNOSIS — R682 Dry mouth, unspecified: Secondary | ICD-10-CM | POA: Diagnosis not present

## 2024-02-06 MED ORDER — LEVOTHYROXINE SODIUM 50 MCG PO TABS: 50.0000 ug | ORAL_TABLET | Freq: Every day | ORAL | 1 refills | Status: DC

## 2024-02-06 MED ORDER — LEVOTHYROXINE SODIUM 75 MCG PO TABS: 75.0000 ug | ORAL_TABLET | Freq: Every day | ORAL | 3 refills | Status: DC

## 2024-02-06 NOTE — Patient Instructions (Signed)
 VISIT SUMMARY:  During today's visit, we discussed your concerns about thyroid function, dry mouth, and your ongoing management of glaucoma and chronic kidney disease. We reviewed your current medications and made some adjustments to better address your symptoms and improve your overall health.  YOUR PLAN:  -HYPOTHYROIDISM: Hypothyroidism is a condition where your thyroid gland does not produce enough thyroid hormone. We have decided to switch your medication from Armour Thyroid to levothyroxine 75 mcg daily to help better control your symptoms and stabilize your TSH levels. Please discontinue Armour Thyroid and start taking levothyroxine as prescribed. We will recheck your TSH levels in a few months to monitor your progress.  -CHRONIC KIDNEY DISEASE STAGE 3: Chronic Kidney Disease (CKD) Stage 3 means your kidneys are moderately damaged and not working as well as they should. Your kidney function has been stable, and we will continue to monitor it 2-3 times a year. Please avoid NSAIDs like ibuprofen and maintain good hydration. We will also check your urine for microalbumin at your next visit to assess kidney health.  -DRY MOUTH: Dry mouth can be caused by environmental factors and certain medications. To help alleviate your symptoms, we recommend reducing your zinc lozenges to every other night and adding lemon drops to your water to stimulate saliva production. Monitor your symptoms and let us know if there are any changes.  -GLAUCOMA: Glaucoma is a condition that damages your eye's optic nerve and can lead to vision loss. You are currently using timolol and latanoprost eye drops to manage this condition. It is important to continue these medications as prescribed to maintain your vision. If you experience any new symptoms, please discuss them with your ophthalmologist.  INSTRUCTIONS:  Please follow up in a few months to recheck your TSH levels. We will also monitor your kidney function 2-3 times a  year and check your urine for microalbumin at your next visit. Continue to avoid NSAIDs and maintain good hydration.

## 2024-02-07 ENCOUNTER — Encounter: Payer: Self-pay | Admitting: Student

## 2024-02-07 NOTE — Progress Notes (Signed)
 [facility] Provider: Sydnee Cabal  Code Status: Full Code Goals of Care:     12/19/2023    3:51 PM  Advanced Directives  Does Patient Have a Medical Advance Directive? Yes  Type of Estate agent of [place];Living will  Does patient want to make changes to medical advance directive? No - Patient declined  Copy of Healthcare Power of Attorney in Chart? Yes - validated most recent copy scanned in chart (See row information)     Chief Complaint  Patient presents with   Medical Management of Chronic Issues    Medical Management of Chronic Issues. 6 week follow up. Discuss labs    HPI: Patient is a 83 y.o. female seen today for medical management of chronic diseases.   Discussed the use of AI scribe software for clinical note transcription with the patient, who gave verbal consent to proceed.  History of Present Illness   The patient presents with concerns about thyroid function and dry mouth.  She is concerned about her thyroid function, specifically her TSH levels, despite taking 30 mg of Armour Thyroid daily. Previously, she was on a regimen of 15 mg, taking two pills four days a week and three pills on the remaining days. She has been taking the medication between 3 and 5 AM with water and has improved her water intake. She has gained approximately 10 pounds over the last year and a half.  She experiences a persistent dry mouth, which she attributes to environmental factors such as winter dryness. She uses a humidifier in her bedroom but continues to have a dry throat, which has caused difficulty swallowing pills. She takes Timoptic (timolol) eye drops in the morning and latanoprost at night for glaucoma. She experiences a metallic taste after using Timoptic, which has been mitigated by rubbing her eyes post-application. She also takes a zinc lozenge at night, which dissolves slowly due to her dry mouth.  Her kidney function has been  monitored, and she notes an improvement in her filtration rate, which she attributes to increased water intake. She avoids nephrotoxic medications like ibuprofen and occasionally takes Tylenol PM for pain relief after physical activity.         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   Leukopenia 05/18/2014   Mass of axilla 01/05/2012   Measles as a child   Medicare annual wellness visit, subsequent 01/05/2012   Sees Dr Loreta Ave of Gastroenterology    Neutropenia (HCC) 10/07/2016   Osteopenia 01/05/2012   Other and unspecified hyperlipidemia 05/18/2014   Pain of left heel 04/07/2017   Preventative health care 01/05/2012   Preventative health care 10/07/2016   Skin cancer 12/02/2009   Thyroid disease 03/16/2012   right    Vitamin D deficiency 04/07/2017    Past Surgical History:  Procedure Laterality Date   biopsy of jawline  2011   CATARACT EXTRACTION, BILATERAL     b/l   laproscopy  1970   abdominal, endometriosis, with D&C   right hip replacement  2003   TONSILLECTOMY AND ADENOIDECTOMY  1949    Allergies  Allergen Reactions   Bee Venom Anaphylaxis   Penicillamine Other (See Comments)   Penicillins  Hives    Has patient had a PCN reaction causing immediate rash, facial/tongue/throat swelling, SOB or lightheadedness with hypotension: No Has patient had a PCN reaction causing severe rash involving mucus membranes or skin necrosis: No Has patient had a PCN reaction that required hospitalization No Has patient had a PCN reaction occurring within the last 10 years: No If all of the above answers are "NO", then may proceed with Cephalosporin use.    Outpatient Encounter Medications as of 02/06/2024  Medication Sig   Ascorbic Acid (VITAMIN C) 100 MG tablet Take 500 mg by mouth  daily.   EPINEPHrine 0.3 mg/0.3 mL IJ SOAJ injection Inject 0.3 mg into the muscle as needed for anaphylaxis.   latanoprost (XALATAN) 0.005 % ophthalmic solution Place 1 drop into both eyes at bedtime.   Misc Natural Products (COLON CARE PO) Take 1 capsule by mouth at bedtime. Colon Max   OVER THE COUNTER MEDICATION Take 1 capsule by mouth daily. Ioderol   timolol (TIMOPTIC) 0.5 % ophthalmic solution Place 1 drop into both eyes daily.   Vitamin D-Vitamin K (VITAMIN D2 + K1 PO) Take 1 capsule by mouth daily.   [DISCONTINUED] levothyroxine (SYNTHROID) 75 MCG tablet Take 1 tablet (75 mcg total) by mouth daily.   [DISCONTINUED] thyroid (ARMOUR THYROID) 30 MG tablet Take 1 tablet (30 mg total) by mouth daily.   levothyroxine (SYNTHROID) 50 MCG tablet Take 1 tablet (50 mcg total) by mouth daily.   No facility-administered encounter medications on file as of 02/06/2024.    Review of Systems:  Review of Systems  Health Maintenance  Topic Date Due   INFLUENZA VACCINE  03/01/2024 (Originally 07/03/2023)   Pneumonia Vaccine 20+ Years old (1 of 2 - PCV) 02/05/2025 (Originally 06/17/1947)   Medicare Annual Wellness (AWV)  05/19/2024   DTaP/Tdap/Td (2 - Td or Tdap) 07/25/2031   DEXA SCAN  Completed   HPV VACCINES  Aged Out   Colonoscopy  Discontinued   COVID-19 Vaccine  Discontinued   Zoster Vaccines- Shingrix  Discontinued    Physical Exam: Vitals:   02/06/24 1438  BP: 118/74  Pulse: (!) 56  Temp: 97.6 F (36.4 C)  SpO2: 95%  Weight: 152 lb (68.9 kg)  Height: 5' 3.5" (1.613 m)   Body mass index is 26.5 kg/m. Physical Exam Constitutional:      Appearance: Normal appearance.  Pulmonary:     Effort: Pulmonary effort is normal.  Neurological:     General: No focal deficit present.     Mental Status: She is alert and oriented to person, place, and time.     Gait: Gait normal.  Psychiatric:        Mood and Affect: Mood normal.    Labs reviewed: Basic Metabolic Panel: Recent Labs     03/10/23 0000 12/18/23 0735 02/02/24 0755  NA  --  140 136  K  --  4.8 4.5  CL  --  104 102  CO2  --  28 31  GLUCOSE  --  80 85  BUN  --  20 14  CREATININE  --  1.04* 0.97*  CALCIUM  --  9.1 9.1  TSH 4.42 10.55* 11.76*   Liver Function Tests: Recent Labs    12/18/23 0735  AST 22  ALT 13  BILITOT 0.5  PROT 6.3   No results for input(s): "LIPASE", "AMYLASE" in the last 8760 hours. No results for input(s): "AMMONIA" in the last 8760 hours. CBC: Recent Labs    12/18/23 0735  WBC 4.2  NEUTROABS 2,155  HGB 12.3  HCT 38.9  MCV 94.2  PLT 312   Lipid Panel: No results for input(s): "CHOL", "HDL", "LDLCALC", "TRIG", "CHOLHDL", "LDLDIRECT" in the last 8760 hours. Lab Results  Component Value Date   HGBA1C 6.0 06/13/2022    Procedures since last visit: No results found. Results   LABS EGFR: 54 (January 2025) EGFR: 54 (February 2024) EGFR: 65 (December 2023) EGFR: 55 (2023) EGFR: 49 (2022) EGFR: 45 (2021)      Assessment/Plan Assessment and Plan    Hypothyroidism Persistent hypothyroidism despite treatment with Armour Thyroid 30 mg. Considering weight gain and current symptoms, switching to levothyroxine was discussed for better symptom control and stable TSH levels. Risks include potential side effects such as dry mouth and the need for regular monitoring. - Discontinue Armour Thyroid - Prescribe levothyroxine 50 mcg daily - Recheck TSH in a few months  Chronic Kidney Disease Stage 3 CKD Stage 3 with eGFR around 54, consistent over the past four years. No nephrotoxic medications in use. Advised to avoid NSAIDs and maintain hydration to prevent progression. - Monitor kidney function 2-3 times a year - Check urine microalbumin at next visit - Avoid NSAIDs  Dry Mouth Chronic dry mouth potentially exacerbated by environmental factors and medications, including timolol eye drops. Discussed reducing zinc lozenges and adding lemon drops to water to stimulate saliva  production. - Reduce zinc lozenges to every other night or discontinue completely - Add lemon drops to water - Monitor symptoms and adjust as needed  Glaucoma On timolol and latanoprost for glaucoma management. Timolol may contribute to dry mouth, but maintaining vision and controlling glaucoma is prioritized. Importance of continuing current medications discussed. - Continue timolol and latanoprost as prescribed - Discuss any new symptoms with ophthalmologist  Follow-up - Recheck TSH in 6 weeks - Monitor kidney function 2-3 times a year - Check urine microalbumin at next visit.      Labs/tests ordered:  * No order type specified * Next appt:  05/25/2024

## 2024-03-12 DIAGNOSIS — H10011 Acute follicular conjunctivitis, right eye: Secondary | ICD-10-CM | POA: Diagnosis not present

## 2024-03-18 DIAGNOSIS — E039 Hypothyroidism, unspecified: Secondary | ICD-10-CM | POA: Diagnosis not present

## 2024-03-18 LAB — TSH: TSH: 6.65 m[IU]/L — ABNORMAL HIGH (ref 0.40–4.50)

## 2024-03-19 ENCOUNTER — Other Ambulatory Visit: Payer: Self-pay | Admitting: Student

## 2024-03-19 ENCOUNTER — Encounter: Payer: Self-pay | Admitting: Student

## 2024-03-19 DIAGNOSIS — E039 Hypothyroidism, unspecified: Secondary | ICD-10-CM

## 2024-03-19 DIAGNOSIS — E079 Disorder of thyroid, unspecified: Secondary | ICD-10-CM

## 2024-03-19 MED ORDER — LEVOTHYROXINE SODIUM 75 MCG PO TABS: 75.0000 ug | ORAL_TABLET | Freq: Every day | ORAL | 3 refills | Status: DC

## 2024-03-19 NOTE — Progress Notes (Signed)
 Refill medication at higher dose.   VB

## 2024-03-26 MED ORDER — LEVOTHYROXINE SODIUM 75 MCG PO TABS: 75.0000 ug | ORAL_TABLET | Freq: Every day | ORAL | 3 refills | Status: DC

## 2024-03-31 ENCOUNTER — Telehealth: Payer: Self-pay

## 2024-03-31 NOTE — Telephone Encounter (Signed)
 Called patient and she states that she doesn't need to be seen because she's feeling better.

## 2024-03-31 NOTE — Telephone Encounter (Signed)
 Copied from CRM [id number]. Topic: Appointments - Scheduling Inquiry for Clinic >> Mar 30, 2024  8:02 AM Arlie Benedict B wrote: Patient was calling to be seeing by either Dr. Roselie Conger or Dr. Jann Melody. She is needing to be schedule for an appointment asap over at [facility]. The patient stated that she's been coughing and congested the almost a month now. Doesn't have the flu or COVID, but would like to be seen. I asked had she been running a fever the patient stated she wasn't sure if she had a fever or not.  [id number], >> Mar 31, 2024  4:18 PM Tiffany H wrote: Developed a cough/sore throat, pink eye and fever after [place] trip with grandchildren last month. Cough and congestion lingered. Patient saw Ophthalmologist about pink eye and was cleared. Cancelled 5/2 appointment because she is feeling better. No current symptoms.

## 2024-04-02 ENCOUNTER — Ambulatory Visit: Admitting: Student

## 2024-04-07 ENCOUNTER — Encounter: Payer: Self-pay | Admitting: Nurse Practitioner

## 2024-05-05 DIAGNOSIS — H903 Sensorineural hearing loss, bilateral: Secondary | ICD-10-CM | POA: Diagnosis not present

## 2024-05-18 DIAGNOSIS — H401131 Primary open-angle glaucoma, bilateral, mild stage: Secondary | ICD-10-CM | POA: Diagnosis not present

## 2024-05-18 DIAGNOSIS — H353131 Nonexudative age-related macular degeneration, bilateral, early dry stage: Secondary | ICD-10-CM | POA: Diagnosis not present

## 2024-05-18 DIAGNOSIS — H04123 Dry eye syndrome of bilateral lacrimal glands: Secondary | ICD-10-CM | POA: Diagnosis not present

## 2024-05-18 DIAGNOSIS — D3132 Benign neoplasm of left choroid: Secondary | ICD-10-CM | POA: Diagnosis not present

## 2024-05-25 ENCOUNTER — Encounter: Payer: Medicare PPO | Admitting: Nurse Practitioner

## 2024-06-10 ENCOUNTER — Ambulatory Visit: Admitting: Nurse Practitioner

## 2024-06-10 ENCOUNTER — Encounter: Payer: Self-pay | Admitting: Nurse Practitioner

## 2024-06-10 VITALS — BP 128/68 | HR 59 | Temp 97.1°F | Ht 63.5 in | Wt 148.6 lb

## 2024-06-10 DIAGNOSIS — Z0000 Encounter for general adult medical examination without abnormal findings: Secondary | ICD-10-CM

## 2024-06-10 DIAGNOSIS — E2839 Other primary ovarian failure: Secondary | ICD-10-CM

## 2024-06-10 NOTE — Patient Instructions (Signed)
  Angela Buck , Thank you for taking time to come for your Medicare Wellness Visit. I appreciate your ongoing commitment to your health goals. Please review the following plan we discussed and let me know if I can assist you in the future.   Bone density order placed- you can call and schedule.    This is a list of the screening recommended for you and due dates:  Health Maintenance  Topic Date Due   Pneumococcal Vaccine for age over 76 (1 of 2 - PCV) 02/05/2025*   Flu Shot  07/02/2024   Medicare Annual Wellness Visit  06/10/2025   DTaP/Tdap/Td vaccine (2 - Td or Tdap) 07/25/2031   DEXA scan (bone density measurement)  Completed   Hepatitis B Vaccine  Aged Out   HPV Vaccine  Aged Out   Meningitis B Vaccine  Aged Out   Colon Cancer Screening  Discontinued   COVID-19 Vaccine  Discontinued   Zoster (Shingles) Vaccine  Discontinued  *Topic was postponed. The date shown is not the original due date.

## 2024-06-10 NOTE — Progress Notes (Signed)
 Subjective:   Angela Buck is a 83 y.o. female who presents for Medicare Annual (Subsequent) preventive examination.  Visit Complete: In person [facility]   Cardiac Risk Factors include: advanced age (>24men, >87 women);dyslipidemia     Objective:    Today's Vitals   06/10/24 0952  BP: 128/68  Pulse: (!) 59  Temp: (!) 97.1 F (36.2 C)  SpO2: 98%  Weight: 148 lb 9.6 oz (67.4 kg)  Height: 5' 3.5 (1.613 m)   Body mass index is 25.91 kg/m.     06/10/2024    9:56 AM 12/19/2023    3:51 PM 10/07/2023    8:41 AM 07/01/2023   10:35 AM 05/20/2023    8:23 AM 12/11/2022    3:42 PM 09/25/2022   12:55 PM  Advanced Directives  Does Patient Have a Medical Advance Directive? Yes Yes Yes Yes Yes Yes Yes  Type of Estate agent of [place];Living will Healthcare Power of [place];Living will Healthcare Power of [place];Living will Healthcare Power of [place];Living will Healthcare Power of [place];Living will Healthcare Power of [employer] of [place];Living will  Does patient want to make changes to medical advance directive? No - Patient declined No - Patient declined No - Patient declined No - Patient declined No - Patient declined No - Patient declined No - Patient declined  Copy of Healthcare Power of Attorney in Chart? Yes - validated most recent copy scanned in chart (See row information) Yes - validated most recent copy scanned in chart (See row information) Yes - validated most recent copy scanned in chart (See row information) Yes - validated most recent copy scanned in chart (See row information) Yes - validated most recent copy scanned in chart (See row information) Yes - validated most recent copy scanned in chart (See row information) No - copy requested    Current Medications (verified) Outpatient Encounter Medications as of 06/10/2024  Medication Sig   Ascorbic Acid (VITAMIN C) 100 MG tablet Take 500 mg by mouth daily.   EPINEPHrine  0.3  mg/0.3 mL IJ SOAJ injection Inject 0.3 mg into the muscle as needed for anaphylaxis.   latanoprost (XALATAN) 0.005 % ophthalmic solution Place 1 drop into both eyes at bedtime.   levothyroxine  (SYNTHROID ) 75 MCG tablet Take 1 tablet (75 mcg total) by mouth daily.   Misc Natural Products (COLON CARE PO) Take 1 capsule by mouth at bedtime. Colon Max   OVER THE COUNTER MEDICATION Take 1 capsule by mouth daily. Ioderol   timolol (TIMOPTIC) 0.5 % ophthalmic solution Place 1 drop into both eyes daily.   Vitamin D -Vitamin K (VITAMIN D2 + K1 PO) Take 1 capsule by mouth daily.   No facility-administered encounter medications on file as of 06/10/2024.    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   Leukopenia 05/18/2014   Mass of axilla 01/05/2012   Measles as a child   Medicare annual wellness visit, subsequent 01/05/2012   Sees Dr Kristie of Gastroenterology    Neutropenia (HCC) 10/07/2016   Osteopenia 01/05/2012   Other and unspecified hyperlipidemia 05/18/2014   Pain of left heel 04/07/2017   Preventative health care 01/05/2012  Preventative health care 10/07/2016   Skin cancer 12/02/2009   Thyroid  disease 03/16/2012   right    Vitamin D  deficiency 04/07/2017   Past Surgical History:  Procedure Laterality Date   biopsy of jawline  2011   CATARACT EXTRACTION, BILATERAL     b/l   laproscopy  1970   abdominal, endometriosis, with D&C   right hip replacement  2003   TONSILLECTOMY AND ADENOIDECTOMY  1949   Family History  Problem Relation Age of Onset   Arthritis Mother    Hypertension Mother    Dementia Mother    Heart attack Mother    Glaucoma Mother    Heart disease Mother        MI,  stent in 2004   Emphysema Father        smoker   Hypertension Father    Glaucoma Father    COPD Father    Osteoporosis Sister        Per [facility]  New Patient packet   Ulcers Maternal Grandmother    Other Paternal Grandfather        enlarged heart   Social History   Socioeconomic History   Marital status: Divorced    Spouse name: Not on file   Number of children: Not on file   Years of education: Not on file   Highest education level: Doctorate  Occupational History   Not on file  Tobacco Use   Smoking status: Never   Smokeless tobacco: Never  Vaping Use   Vaping status: Never Used  Substance and Sexual Activity   Alcohol use: Yes    Comment: occasional wine   Drug use: No   Sexual activity: Never    Partners: Female    Comment: lives with partner, no major dietary restrictions.   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      Family: sister in [place] and Daughter in [place], [place] - 1 grandchild      Enjoys: yoga, pickle ball, travel, the arts      Exercise: daily   Diet: generally healthy      Safety   Seat belts: Yes    Guns: No   Safe in relationships: Yes       Social Drivers of Corporate investment banker Strain: Low Risk  (12/15/2023)   Overall Financial Resource Strain (CARDIA)    Difficulty of Paying Living Expenses: Not hard at all  Food Insecurity: No Food Insecurity (06/10/2024)   Hunger Vital Sign    Worried About Running Out of Food in the Last Year: Never true    Ran Out of Food in the Last Year: Never true  Transportation Needs: No Transportation Needs (06/10/2024)   PRAPARE - Administrator, Civil Service (Medical): No    Lack of Transportation (Non-Medical): No  Physical Activity: Sufficiently Active (12/15/2023)   Exercise Vital Sign    Days of  Exercise per Week: 4 days    Minutes of Exercise per Session: 60 min  Stress: No Stress Concern Present (12/15/2023)   [employer] of Occupational Health - Occupational Stress Questionnaire    Feeling of Stress : Only a little  Social Connections: Moderately Integrated (12/15/2023)   Social Connection and Isolation Panel    Frequency  of Communication with Friends and Family: More than three times a week    Frequency of Social Gatherings with Friends and Family: More than three times a week    Attends Religious Services: More than 4 times per year    Active Member of [employer] or Organizations: Yes    Attends Banker Meetings: More than 4 times per year    Marital Status: Widowed    Tobacco Counseling Counseling given: Not Answered   Clinical Intake:  Pre-visit preparation completed: Yes  Pain : No/denies pain     BMI - recorded: 25 Nutritional Risks: None Diabetes: No  How often do you need to have someone help you when you read instructions, pamphlets, or other written materials from your doctor or pharmacy?: 1 - Never         Activities of Daily Living    06/10/2024   10:04 AM  In your present state of health, do you have any difficulty performing the following activities:  Hearing? 1  Vision? 0  Difficulty concentrating or making decisions? 0  Walking or climbing stairs? 0  Dressing or bathing? 0  Doing errands, shopping? 0  Preparing Food and eating ? N  Using the Toilet? N  In the past six months, have you accidently leaked urine? Y  Do you have problems with loss of bowel control? N  Managing your Medications? N  Managing your Finances? N  Housekeeping or managing your Housekeeping? N    Patient Care Team: Abdul Fine, MD as PCP - General (Family Medicine) Domenica Harlene LABOR, MD (Family Medicine) Robinson Pao, MD as Consulting Physician (Dermatology) Vernell Kindle as Referring Physician (Ophthalmology)  Indicate any recent Medical  Services you may have received from other than [facility] providers in the past year (date may be approximate).     Assessment:   This is a routine wellness examination for Jannelle.  Hearing/Vision screen Vision Screening - Comments:: Dr. Kindle in [place] Last Exam 06/2024   Goals Addressed   None    Depression Screen    06/10/2024    9:55 AM 10/07/2023    8:42 AM 07/01/2023   10:36 AM 05/20/2023    8:24 AM 09/25/2022   12:53 PM 09/10/2022    2:48 PM 11/01/2021    3:12 PM  PHQ 2/9 Scores  PHQ - 2 Score 0 0 0 0 0 0 1  PHQ- 9 Score      0     Fall Risk    06/10/2024    9:55 AM 10/07/2023    8:42 AM 07/01/2023   10:36 AM 05/20/2023    8:23 AM 05/19/2023    2:23 PM  Fall Risk   Falls in the past year? 0 1 1 1  0  Number falls in past yr: 0 1 0 0 0  Injury with Fall? 0 1 1 1 1   Risk for fall due to : No Fall Risks      Follow up Falls evaluation completed        MEDICARE RISK AT HOME: Medicare Risk at Home Any stairs in or around the home?: Yes If so, are there any without handrails?: No Home free of loose throw rugs in walkways, pet beds, electrical cords, etc?: Yes Adequate lighting in your home to reduce risk of falls?: Yes Life alert?: Yes Use of a cane, walker or w/c?: No Grab bars in the bathroom?: Yes Shower chair or bench in shower?: Yes Elevated toilet seat or a handicapped  toilet?: Yes  TIMED UP AND GO:  Was the test performed?  No    Cognitive Function:        06/10/2024    9:57 AM 05/20/2023    8:26 AM  6CIT Screen  What Year? 0 points 0 points  What month? 0 points 0 points  What time? 0 points 0 points  Count back from 20 0 points 0 points  Months in reverse 0 points 0 points  Repeat phrase 0 points 0 points  Total Score 0 points 0 points    Immunizations Immunization History  Administered Date(s) Administered   Fluad Quad(high Dose 65+) 10/14/2022   Influenza-Unspecified 10/14/2022   Moderna Sars-Covid-2 Vaccination 12/20/2019, 01/17/2020,  10/13/2020, 04/19/2021   Pfizer Covid-19 Vaccine Bivalent Booster 106yrs & up 08/23/2021, 04/30/2022   Tdap 07/24/2021    TDAP status: Up to date  Flu Vaccine status: Up to date  Pneumococcal vaccine status: Up to date  Covid-19 vaccine status: Information provided on how to obtain vaccines.   Qualifies for Shingles Vaccine? Yes   Zostavax completed No   Shingrix Completed?: Yes  Screening Tests Health Maintenance  Topic Date Due   Pneumococcal Vaccine: 50+ Years (1 of 2 - PCV) 02/05/2025 (Originally 06/17/1960)   INFLUENZA VACCINE  07/02/2024   Medicare Annual Wellness (AWV)  06/10/2025   DTaP/Tdap/Td (2 - Td or Tdap) 07/25/2031   DEXA SCAN  Completed   Hepatitis B Vaccines  Aged Out   HPV VACCINES  Aged Out   Meningococcal B Vaccine  Aged Out   Colonoscopy  Discontinued   COVID-19 Vaccine  Discontinued   Zoster Vaccines- Shingrix  Discontinued    Health Maintenance  There are no preventive care reminders to display for this patient.   Colorectal cancer screening: No longer required.   Mammogram status: No longer required due to age.  Bone Density status: Completed 04/2022. Results reflect: Bone density results: OSTEOPOROSIS. Repeat every 2 years.  Lung Cancer Screening: (Low Dose CT Chest recommended if Age 1-80 years, 20 pack-year currently smoking OR have quit w/in 15years.) does not qualify.   Lung Cancer Screening Referral: na  Additional Screening:  Hepatitis C Screening: does not qualify; Completed  na  Vision Screening: Recommended annual ophthalmology exams for early detection of glaucoma and other disorders of the eye. Is the patient up to date with their annual eye exam?  Yes  Who is the provider or what is the name of the office in which the patient attends annual eye exams? Dr. Marelyn  If pt is not established with a provider, would they like to be referred to a provider to establish care? No .   Dental Screening: Recommended annual dental exams  for proper oral hygiene  Community Resource Referral / Chronic Care Management: CRR required this visit?  No   CCM required this visit?  No     Plan:     I have personally reviewed and noted the following in the patient's chart:   Medical and social history Use of alcohol, tobacco or illicit drugs  Current medications and supplements including opioid prescriptions. Patient is not currently taking opioid prescriptions. Functional ability and status Nutritional status Physical activity Advanced directives List of other physicians Hospitalizations, surgeries, and ER visits in previous 12 months Vitals Screenings to include cognitive, depression, and falls Referrals and appointments  In addition, I have reviewed and discussed with patient certain preventive protocols, quality metrics, and best practice recommendations. A written personalized care plan for preventive  services as well as general preventive health recommendations were provided to patient.     Harlene MARLA An, NP   06/10/2024

## 2024-06-25 ENCOUNTER — Ambulatory Visit: Admitting: Student

## 2024-06-25 ENCOUNTER — Encounter: Payer: Self-pay | Admitting: Student

## 2024-06-25 VITALS — BP 126/70 | HR 58 | Temp 97.2°F | Ht 63.5 in | Wt 149.0 lb

## 2024-06-25 DIAGNOSIS — E785 Hyperlipidemia, unspecified: Secondary | ICD-10-CM

## 2024-06-25 DIAGNOSIS — R7303 Prediabetes: Secondary | ICD-10-CM | POA: Diagnosis not present

## 2024-06-25 DIAGNOSIS — N393 Stress incontinence (female) (male): Secondary | ICD-10-CM

## 2024-06-25 DIAGNOSIS — N1831 Chronic kidney disease, stage 3a: Secondary | ICD-10-CM

## 2024-06-25 DIAGNOSIS — E079 Disorder of thyroid, unspecified: Secondary | ICD-10-CM

## 2024-06-25 NOTE — Patient Instructions (Signed)
 VISIT SUMMARY:  Today, you were seen for urinary symptoms and dry mouth. We discussed your current symptoms, reviewed your history, and made plans for further evaluation and management.  YOUR PLAN:  -URINARY INCONTINENCE: Urinary incontinence is the loss of bladder control, leading to accidental urine leakage. Your symptoms include a sensation of incomplete bladder emptying and leakage when lifting heavy objects or coughing. We will refer you to a urogynecologist for further evaluation and management. We discussed non-surgical options like using a pessary and surgical options like a urethral sling procedure. Please continue with Kegel exercises and consider pelvic floor physical therapy.  -THYROID  DISORDER: A thyroid  disorder involves the thyroid  gland not functioning properly, which can affect your metabolism and energy levels. We will order thyroid  function tests to monitor your condition.  -DRY MOUTH: Dry mouth is a condition where your mouth feels unusually dry, which can be uncomfortable. Your symptoms have improved with increased water intake and using cough drops. We will check your kidney function as part of your lab work to ensure there are no underlying issues.  INSTRUCTIONS:  Please follow up with the urogynecologist as referred for your urinary incontinence. Continue with your Kegel exercises and consider pelvic floor physical therapy. We will order thyroid  function tests and check your kidney function as part of your lab work. If you have any new symptoms or concerns, please contact our office.

## 2024-06-25 NOTE — Progress Notes (Signed)
 Location:  [facility] POS: [facility] Provider: ABDUL  Code Status: Full Code Goals of Care:     06/10/2024    9:56 AM  Advanced Directives  Does Patient Have a Medical Advance Directive? Yes  Type of Estate agent of [place];Living will  Does patient want to make changes to medical advance directive? No - Patient declined  Copy of Healthcare Power of Attorney in Chart? Yes - validated most recent copy scanned in chart (See row information)     Chief Complaint  Patient presents with   Medical Management of Chronic Issues    Medical Management of Chronic Issues. 4 Month follow up    HPI: Patient is a 83 y.o. female seen today for medical management of chronic diseases.   Discussed the use of AI scribe software for clinical note transcription with the patient, who gave verbal consent to proceed.  History of Present Illness   Angela Buck is an 83 year old female who presents with urinary symptoms and concerns about possible pelvic organ prolapse.  She experiences urinary symptoms, including a sensation of incomplete bladder emptying and occasional leakage, particularly when lifting heavy objects or coughing. She has been attempting pelvic floor exercises since last year but has not been consistent. Recently, she started incorporating exercises shared by a friend.  She has a history of dry mouth, which has been improving. Using cough drops helps alleviate the symptoms. She experienced several nights of dry mouth while in [place] but reports improvement over the last couple of weeks.  Her daughter recently underwent a double mastectomy and reconstruction for stage 1B breast cancer, which did not require chemotherapy or radiation. She spent three weeks in [place] to support her daughter during the surgeries.  She has been monitoring her water intake, which she feels has improved, and is trying to urinate more frequently.  She reports issues with  sleep, often due to having too much on her mind. She finds making lists of her thoughts helpful in managing her stress and improving her sleep quality.       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   Leukopenia 05/18/2014   Mass of axilla 01/05/2012   Measles as a child   Medicare annual wellness visit, subsequent 01/05/2012   Sees Dr Kristie of Gastroenterology    Neutropenia (HCC) 10/07/2016   Osteopenia 01/05/2012   Other and unspecified hyperlipidemia 05/18/2014   Pain of left heel 04/07/2017   Preventative health care 01/05/2012   Preventative health care 10/07/2016   Skin cancer 12/02/2009   Thyroid  disease 03/16/2012   right    Vitamin D  deficiency 04/07/2017    Past Surgical History:  Procedure Laterality Date   biopsy of jawline  2011   CATARACT EXTRACTION, BILATERAL     b/l   laproscopy  1970   abdominal, endometriosis, with D&C   right hip replacement  2003   TONSILLECTOMY AND ADENOIDECTOMY  1949    Allergies  Allergen Reactions   Bee Venom Anaphylaxis   Penicillamine Other (See Comments)   Penicillins Hives    Has patient had a PCN reaction causing immediate rash, facial/tongue/throat swelling, SOB or lightheadedness with hypotension: No Has patient  had a PCN reaction causing severe rash involving mucus membranes or skin necrosis: No Has patient had a PCN reaction that required hospitalization No Has patient had a PCN reaction occurring within the last 10 years: No If all of the above answers are NO, then may proceed with Cephalosporin use.   Shellfish Allergy     Outpatient Encounter Medications as of 06/25/2024  Medication Sig   Ascorbic Acid (VITAMIN C) 100 MG tablet Take 500 mg by mouth daily.   EPINEPHrine  0.3 mg/0.3 mL IJ SOAJ injection Inject 0.3 mg  into the muscle as needed for anaphylaxis.   latanoprost (XALATAN) 0.005 % ophthalmic solution Place 1 drop into both eyes at bedtime.   levothyroxine  (SYNTHROID ) 75 MCG tablet Take 1 tablet (75 mcg total) by mouth daily.   Misc Natural Products (COLON CARE PO) Take 1 capsule by mouth at bedtime. Colon Max   OVER THE COUNTER MEDICATION Take 1 capsule by mouth daily. Ioderol   timolol (TIMOPTIC) 0.5 % ophthalmic solution Place 1 drop into both eyes daily.   Vitamin D -Vitamin K (VITAMIN D2 + K1 PO) Take 1 capsule by mouth daily.   No facility-administered encounter medications on file as of 06/25/2024.    Review of Systems:  Review of Systems  Health Maintenance  Topic Date Due   Pneumococcal Vaccine: 50+ Years (1 of 2 - PCV) 02/05/2025 (Originally 06/17/1960)   INFLUENZA VACCINE  07/02/2024   Medicare Annual Wellness (AWV)  06/10/2025   DTaP/Tdap/Td (2 - Td or Tdap) 07/25/2031   DEXA SCAN  Completed   Hepatitis B Vaccines  Aged Out   HPV VACCINES  Aged Out   Meningococcal B Vaccine  Aged Out   Colonoscopy  Discontinued   COVID-19 Vaccine  Discontinued   Zoster Vaccines- Shingrix  Discontinued    Physical Exam: Vitals:   06/25/24 0919  BP: 126/70  Pulse: (!) 58  Temp: (!) 97.2 F (36.2 C)  SpO2: 99%  Weight: 149 lb (67.6 kg)  Height: 5' 3.5 (1.613 m)   Body mass index is 25.98 kg/m. Physical Exam Constitutional:      Appearance: Normal appearance.  Cardiovascular:     Rate and Rhythm: Normal rate and regular rhythm.     Pulses: Normal pulses.     Heart sounds: Normal heart sounds.  Pulmonary:     Effort: Pulmonary effort is normal.  Abdominal:     General: Abdomen is flat. Bowel sounds are normal.     Palpations: Abdomen is soft.  Musculoskeletal:        General: No swelling or tenderness.  Skin:    General: Skin is warm and dry.     Capillary Refill: Capillary refill takes 2 to 3 seconds.  Neurological:     Mental Status: She is alert and oriented to person,  place, and time.     Gait: Gait normal.  Psychiatric:        Mood and Affect: Mood normal.    Physical Exam          Labs reviewed: Basic Metabolic Panel: Recent Labs    12/18/23 0735 02/02/24 0755 03/18/24 0740  NA 140 136  --   K 4.8 4.5  --   CL 104 102  --   CO2 28 31  --   GLUCOSE 80 85  --   BUN 20 14  --   CREATININE 1.04* 0.97*  --   CALCIUM 9.1 9.1  --   TSH 10.55* 11.76* 6.65*  Liver Function Tests: Recent Labs    12/18/23 0735  AST 22  ALT 13  BILITOT 0.5  PROT 6.3   No results for input(s): LIPASE, AMYLASE in the last 8760 hours. No results for input(s): AMMONIA in the last 8760 hours. CBC: Recent Labs    12/18/23 0735  WBC 4.2  NEUTROABS 2,155  HGB 12.3  HCT 38.9  MCV 94.2  PLT 312   Lipid Panel: No results for input(s): CHOL, HDL, LDLCALC, TRIG, CHOLHDL, LDLDIRECT in the last 8760 hours. Lab Results  Component Value Date   HGBA1C 6.0 06/13/2022    Procedures since last visit: No results found. Results          Assessment/Plan 1. Stress incontinence (Primary) Symptoms include urinary incontinence and sensation of incomplete bladder emptying, without visible protrusion. She has been performing Kegel exercises and is considering pelvic floor physical therapy. Discussed non-surgical options like pessary use, which may vary in effectiveness and comfort, and surgical options such as urethral sling, involving general anesthesia and bladder tacking to prevent incontinence. - Refer to urogynecology for further evaluation and management - Discuss non-surgical and surgical management options with urogynecology  Hypothyroidism Well-managed hypothyroidism requiring routine monitoring of thyroid  function tests to assess medication dosage. - Order thyroid  function tests  Osteoporosis Awaiting bone density scan to assess current status. She prefers consistent equipment for result accuracy. - Await results of bone density  scan scheduled for September  Xerostomia Intermittent dry mouth improved with cough drops. Symptoms have been better recently. - Continue using cough drops as needed  General Health Maintenance Declined further COVID-19 and pneumonia vaccinations. Last vitamin D  levels were adequate. Due for cholesterol panel as it has been over a year since last checked. - Order cholesterol panel - Check kidney function with thyroid  function tests  Follow-up Follow-up plans contingent on lab results and her needs. - Schedule blood collection for Monday - Determine follow-up interval based on lab results, potentially in six months or next year          Labs/tests ordered:  * No order type specified * Next appt:  Visit date not found

## 2024-06-28 ENCOUNTER — Ambulatory Visit: Payer: Self-pay | Admitting: Student

## 2024-06-28 DIAGNOSIS — R7303 Prediabetes: Secondary | ICD-10-CM | POA: Diagnosis not present

## 2024-06-28 DIAGNOSIS — E785 Hyperlipidemia, unspecified: Secondary | ICD-10-CM | POA: Diagnosis not present

## 2024-06-28 DIAGNOSIS — N1831 Chronic kidney disease, stage 3a: Secondary | ICD-10-CM | POA: Diagnosis not present

## 2024-06-28 DIAGNOSIS — E079 Disorder of thyroid, unspecified: Secondary | ICD-10-CM | POA: Diagnosis not present

## 2024-06-29 LAB — LIPID PANEL
Cholesterol: 211 mg/dL — ABNORMAL HIGH (ref <200)
HDL: 61 mg/dL (ref >=50)
LDL Cholesterol (Calc): 131 mg/dL — ABNORMAL HIGH
Non-HDL Cholesterol (Calc): 150 mg/dL — ABNORMAL HIGH (ref <130)
Total CHOL/HDL Ratio: 3.5 (calc) (ref <5.0)
Triglycerides: 93 mg/dL (ref <150)

## 2024-06-29 LAB — TSH: TSH: 0.61 m[IU]/L (ref 0.40–4.50)

## 2024-06-29 LAB — HEMOGLOBIN A1C
Hgb A1c MFr Bld: 5.8 % — ABNORMAL HIGH (ref <5.7)
Mean Plasma Glucose: 120 mg/dL
eAG (mmol/L): 6.6 mmol/L

## 2024-06-29 LAB — COMPREHENSIVE METABOLIC PANEL WITH GFR
AG Ratio: 1.6 (calc) (ref 1.0–2.5)
ALT: 19 U/L (ref 6–29)
AST: 18 U/L (ref 10–35)
Albumin: 3.8 g/dL (ref 3.6–5.1)
Alkaline phosphatase (APISO): 67 U/L (ref 37–153)
BUN: 21 mg/dL (ref 7–25)
CO2: 28 mmol/L (ref 20–32)
Calcium: 9.2 mg/dL (ref 8.6–10.4)
Chloride: 104 mmol/L (ref 98–110)
Creat: 0.92 mg/dL (ref 0.60–0.95)
Globulin: 2.4 g/dL (ref 1.9–3.7)
Glucose, Bld: 78 mg/dL (ref 65–99)
Potassium: 4.2 mmol/L (ref 3.5–5.3)
Sodium: 138 mmol/L (ref 135–146)
Total Bilirubin: 0.5 mg/dL (ref 0.2–1.2)
Total Protein: 6.2 g/dL (ref 6.1–8.1)
eGFR: 63 mL/min/1.73m2 (ref >=60)

## 2024-06-29 LAB — PTH, INTACT AND CALCIUM
Calcium: 9.2 mg/dL (ref 8.6–10.4)
PTH: 27 pg/mL (ref 8.6–77)

## 2024-06-29 LAB — PHOSPHORUS: Phosphorus: 3.5 mg/dL (ref 2.1–4.3)

## 2024-08-12 ENCOUNTER — Ambulatory Visit (HOSPITAL_BASED_OUTPATIENT_CLINIC_OR_DEPARTMENT_OTHER)
Admission: RE | Admit: 2024-08-12 | Discharge: 2024-08-12 | Disposition: A | Discharge status: Home / Self Care | Source: Ambulatory Visit | Attending: Nurse Practitioner | Admitting: Nurse Practitioner

## 2024-08-12 DIAGNOSIS — E2839 Other primary ovarian failure: Secondary | ICD-10-CM | POA: Insufficient documentation

## 2024-08-12 DIAGNOSIS — Z780 Asymptomatic menopausal state: Secondary | ICD-10-CM | POA: Diagnosis not present

## 2024-08-12 DIAGNOSIS — M810 Age-related osteoporosis without current pathological fracture: Secondary | ICD-10-CM | POA: Diagnosis not present

## 2024-10-01 ENCOUNTER — Other Ambulatory Visit: Payer: Self-pay

## 2024-10-01 ENCOUNTER — Inpatient Hospital Stay

## 2024-10-01 ENCOUNTER — Inpatient Hospital Stay
Admission: EM | Admit: 2024-10-01 | Discharge: 2024-10-03 | DRG: 389 | Disposition: A | Discharge status: Home / Self Care | Attending: Internal Medicine | Admitting: Internal Medicine

## 2024-10-01 ENCOUNTER — Emergency Department

## 2024-10-01 DIAGNOSIS — K449 Diaphragmatic hernia without obstruction or gangrene: Secondary | ICD-10-CM | POA: Diagnosis not present

## 2024-10-01 DIAGNOSIS — Z9841 Cataract extraction status, right eye: Secondary | ICD-10-CM | POA: Diagnosis not present

## 2024-10-01 DIAGNOSIS — I480 Paroxysmal atrial fibrillation: Secondary | ICD-10-CM | POA: Insufficient documentation

## 2024-10-01 DIAGNOSIS — Z83511 Family history of glaucoma: Secondary | ICD-10-CM

## 2024-10-01 DIAGNOSIS — Z91030 Bee allergy status: Secondary | ICD-10-CM | POA: Diagnosis not present

## 2024-10-01 DIAGNOSIS — Z79890 Hormone replacement therapy (postmenopausal): Secondary | ICD-10-CM

## 2024-10-01 DIAGNOSIS — N1831 Chronic kidney disease, stage 3a: Secondary | ICD-10-CM | POA: Diagnosis not present

## 2024-10-01 DIAGNOSIS — Z96641 Presence of right artificial hip joint: Secondary | ICD-10-CM | POA: Diagnosis present

## 2024-10-01 DIAGNOSIS — Z8619 Personal history of other infectious and parasitic diseases: Secondary | ICD-10-CM | POA: Diagnosis not present

## 2024-10-01 DIAGNOSIS — Z860100 Personal history of colon polyps, unspecified: Secondary | ICD-10-CM | POA: Diagnosis not present

## 2024-10-01 DIAGNOSIS — Z91013 Allergy to seafood: Secondary | ICD-10-CM

## 2024-10-01 DIAGNOSIS — Z8249 Family history of ischemic heart disease and other diseases of the circulatory system: Secondary | ICD-10-CM

## 2024-10-01 DIAGNOSIS — R1084 Generalized abdominal pain: Principal | ICD-10-CM

## 2024-10-01 DIAGNOSIS — D72829 Elevated white blood cell count, unspecified: Secondary | ICD-10-CM | POA: Diagnosis present

## 2024-10-01 DIAGNOSIS — R188 Other ascites: Secondary | ICD-10-CM | POA: Diagnosis not present

## 2024-10-01 DIAGNOSIS — E860 Dehydration: Secondary | ICD-10-CM | POA: Diagnosis present

## 2024-10-01 DIAGNOSIS — R9431 Abnormal electrocardiogram [ECG] [EKG]: Secondary | ICD-10-CM | POA: Diagnosis not present

## 2024-10-01 DIAGNOSIS — Z85828 Personal history of other malignant neoplasm of skin: Secondary | ICD-10-CM

## 2024-10-01 DIAGNOSIS — M8580 Other specified disorders of bone density and structure, unspecified site: Secondary | ICD-10-CM | POA: Diagnosis present

## 2024-10-01 DIAGNOSIS — Z4682 Encounter for fitting and adjustment of non-vascular catheter: Secondary | ICD-10-CM | POA: Diagnosis not present

## 2024-10-01 DIAGNOSIS — Z9842 Cataract extraction status, left eye: Secondary | ICD-10-CM

## 2024-10-01 DIAGNOSIS — Z880 Allergy status to penicillin: Secondary | ICD-10-CM

## 2024-10-01 DIAGNOSIS — Z8262 Family history of osteoporosis: Secondary | ICD-10-CM

## 2024-10-01 DIAGNOSIS — K56609 Unspecified intestinal obstruction, unspecified as to partial versus complete obstruction: Secondary | ICD-10-CM

## 2024-10-01 DIAGNOSIS — Z955 Presence of coronary angioplasty implant and graft: Secondary | ICD-10-CM

## 2024-10-01 DIAGNOSIS — E039 Hypothyroidism, unspecified: Secondary | ICD-10-CM | POA: Diagnosis not present

## 2024-10-01 DIAGNOSIS — I4891 Unspecified atrial fibrillation: Secondary | ICD-10-CM | POA: Insufficient documentation

## 2024-10-01 DIAGNOSIS — K862 Cyst of pancreas: Secondary | ICD-10-CM | POA: Diagnosis present

## 2024-10-01 DIAGNOSIS — R112 Nausea with vomiting, unspecified: Secondary | ICD-10-CM | POA: Diagnosis not present

## 2024-10-01 DIAGNOSIS — Z825 Family history of asthma and other chronic lower respiratory diseases: Secondary | ICD-10-CM | POA: Diagnosis not present

## 2024-10-01 DIAGNOSIS — Z8261 Family history of arthritis: Secondary | ICD-10-CM | POA: Diagnosis not present

## 2024-10-01 DIAGNOSIS — K869 Disease of pancreas, unspecified: Secondary | ICD-10-CM | POA: Diagnosis present

## 2024-10-01 DIAGNOSIS — K8689 Other specified diseases of pancreas: Secondary | ICD-10-CM | POA: Insufficient documentation

## 2024-10-01 DIAGNOSIS — R918 Other nonspecific abnormal finding of lung field: Secondary | ICD-10-CM | POA: Diagnosis not present

## 2024-10-01 DIAGNOSIS — E785 Hyperlipidemia, unspecified: Secondary | ICD-10-CM | POA: Diagnosis present

## 2024-10-01 DIAGNOSIS — K565 Intestinal adhesions [bands], unspecified as to partial versus complete obstruction: Principal | ICD-10-CM | POA: Diagnosis present

## 2024-10-01 DIAGNOSIS — Z79899 Other long term (current) drug therapy: Secondary | ICD-10-CM

## 2024-10-01 DIAGNOSIS — R739 Hyperglycemia, unspecified: Secondary | ICD-10-CM | POA: Diagnosis present

## 2024-10-01 DIAGNOSIS — Z820 Family history of epilepsy and other diseases of the nervous system: Secondary | ICD-10-CM

## 2024-10-01 HISTORY — DX: Unspecified intestinal obstruction, unspecified as to partial versus complete obstruction: K56.609

## 2024-10-01 LAB — CBC WITH DIFFERENTIAL/PLATELET
Abs Immature Granulocytes: 0.07 K/uL (ref 0.00–0.07)
Basophils Absolute: 0.1 K/uL (ref 0.0–0.1)
Basophils Relative: 0 %
Eosinophils Absolute: 0 K/uL (ref 0.0–0.5)
Eosinophils Relative: 0 %
HCT: 39.5 % (ref 36.0–46.0)
Hemoglobin: 13 g/dL (ref 12.0–15.0)
Immature Granulocytes: 1 %
Lymphocytes Relative: 14 %
Lymphs Abs: 1.7 K/uL (ref 0.7–4.0)
MCH: 29.7 pg (ref 26.0–34.0)
MCHC: 32.9 g/dL (ref 30.0–36.0)
MCV: 90.2 fL (ref 80.0–100.0)
Monocytes Absolute: 0.5 K/uL (ref 0.1–1.0)
Monocytes Relative: 4 %
Neutro Abs: 9.8 K/uL — ABNORMAL HIGH (ref 1.7–7.7)
Neutrophils Relative %: 81 %
Platelets: 336 K/uL (ref 150–400)
RBC: 4.38 MIL/uL (ref 3.87–5.11)
RDW: 12.2 % (ref 11.5–15.5)
WBC: 12.1 K/uL — ABNORMAL HIGH (ref 4.0–10.5)
nRBC: 0 % (ref 0.0–0.2)

## 2024-10-01 LAB — COMPREHENSIVE METABOLIC PANEL WITH GFR
ALT: 15 U/L (ref 0–44)
AST: 31 U/L (ref 15–41)
Albumin: 3.7 g/dL (ref 3.5–5.0)
Alkaline Phosphatase: 75 U/L (ref 38–126)
Anion gap: 18 — ABNORMAL HIGH (ref 5–15)
BUN: 21 mg/dL (ref 8–23)
CO2: 25 mmol/L (ref 22–32)
Calcium: 9.9 mg/dL (ref 8.9–10.3)
Chloride: 99 mmol/L (ref 98–111)
Creatinine, Ser: 1.12 mg/dL — ABNORMAL HIGH (ref 0.44–1.00)
GFR, Estimated: 49 mL/min — ABNORMAL LOW (ref >60)
Glucose, Bld: 204 mg/dL — ABNORMAL HIGH (ref 70–99)
Potassium: 4 mmol/L (ref 3.5–5.1)
Sodium: 142 mmol/L (ref 135–145)
Total Bilirubin: 0.9 mg/dL (ref 0.0–1.2)
Total Protein: 7.4 g/dL (ref 6.5–8.1)

## 2024-10-01 LAB — TROPONIN I (HIGH SENSITIVITY): Troponin I (High Sensitivity): 5 ng/L (ref <18)

## 2024-10-01 LAB — LIPASE, BLOOD: Lipase: 41 U/L (ref 11–51)

## 2024-10-01 MED ORDER — ONDANSETRON HCL 4 MG/2ML IJ SOLN: 4.0000 mg | Freq: Four times a day (QID) | INTRAMUSCULAR | Status: DC | PRN

## 2024-10-01 MED ORDER — LATANOPROST 0.005 % OP SOLN
1.0000 [drp] | Freq: Every day | OPHTHALMIC | Status: DC
  Administered 2024-10-01 – 2024-10-02 (×2): 1 [drp] via OPHTHALMIC
  Filled 2024-10-01: qty 2.5

## 2024-10-01 MED ORDER — MORPHINE SULFATE (PF) 4 MG/ML IV SOLN
4.0000 mg | Freq: Once | INTRAVENOUS | Status: AC
  Administered 2024-10-01: 4 mg via INTRAVENOUS
  Filled 2024-10-01: qty 1

## 2024-10-01 MED ORDER — HYDROMORPHONE HCL 1 MG/ML IJ SOLN: 0.5000 mg | INTRAMUSCULAR | Status: DC | PRN

## 2024-10-01 MED ORDER — PANTOPRAZOLE SODIUM 40 MG IV SOLR
40.0000 mg | Freq: Two times a day (BID) | INTRAVENOUS | Status: DC
  Administered 2024-10-01 – 2024-10-02 (×4): 40 mg via INTRAVENOUS
  Filled 2024-10-01 (×5): qty 10

## 2024-10-01 MED ORDER — SODIUM CHLORIDE 0.9 % IV SOLN: INTRAVENOUS | Status: AC

## 2024-10-01 MED ORDER — ENOXAPARIN SODIUM 40 MG/0.4ML IJ SOSY
40.0000 mg | PREFILLED_SYRINGE | INTRAMUSCULAR | Status: DC
  Administered 2024-10-01 – 2024-10-02 (×2): 40 mg via SUBCUTANEOUS
  Filled 2024-10-01 (×2): qty 0.4

## 2024-10-01 MED ORDER — LEVOTHYROXINE SODIUM 50 MCG PO TABS
75.0000 ug | ORAL_TABLET | Freq: Every day | ORAL | Status: DC
  Administered 2024-10-03: 75 ug via ORAL
  Filled 2024-10-01 (×2): qty 2

## 2024-10-01 MED ORDER — ONDANSETRON HCL 4 MG/2ML IJ SOLN
4.0000 mg | INTRAMUSCULAR | Status: AC
  Administered 2024-10-01: 4 mg via INTRAVENOUS
  Filled 2024-10-01: qty 2

## 2024-10-01 MED ORDER — IOHEXOL 300 MG/ML  SOLN
100.0000 mL | Freq: Once | INTRAMUSCULAR | Status: AC | PRN
  Administered 2024-10-01: 100 mL via INTRAVENOUS

## 2024-10-01 MED ORDER — TIMOLOL MALEATE 0.5 % OP SOLN
1.0000 [drp] | Freq: Every day | OPHTHALMIC | Status: DC
Start: 2024-10-01 — End: 2024-10-03
  Administered 2024-10-01 – 2024-10-03 (×3): 1 [drp] via OPHTHALMIC
  Filled 2024-10-01 (×2): qty 5

## 2024-10-01 MED ORDER — MORPHINE SULFATE (PF) 4 MG/ML IV SOLN
4.0000 mg | Freq: Once | INTRAVENOUS | Status: DC
  Filled 2024-10-01: qty 1

## 2024-10-01 MED ORDER — METOCLOPRAMIDE HCL 5 MG/ML IJ SOLN
10.0000 mg | INTRAMUSCULAR | Status: AC
  Administered 2024-10-01: 10 mg via INTRAVENOUS
  Filled 2024-10-01: qty 2

## 2024-10-01 NOTE — H&P (Signed)
 History and Physical    Angela Buck FMW:[id number] DOB: [birth year] DOA: 10/01/2024  PCP: Pcp, No (Confirm with patient/family/NH records and if not entered, this has to be entered at [facility] point of entry) Patient coming from: Home  I have personally briefly reviewed patient's old medical records in [facility]  Chief Complaint: Nauseous vomiting abdominal pain  HPI: Angela Buck is a 83 y.o. female with medical history significant of hypothyroidism, HLD, SBO presented with worsening of nauseous vomiting abdominal pain.  Patient has a history of SBO first episode was 3 years ago.  Subsequently patient had 3 more episodes of combined symptoms of nauseous vomiting cramping-like abdominal pain and last 2 years, which she all managed at home with n.p.o. and watery episode resolved within 24 hours and she never came to the hospital to seek help.  Last Thursday, she had another episode of nauseous vomiting abdominal pain, again she manage her symptoms at home and all of her symptoms resolved in 1 day.  Yesterday, patient started to have same symptoms of nausea with vomiting and cramping-like abdominal pain.  She kept herself n.p.o. however after midnight she became dehydrated and started to feel palpitations and lightheadedness and decided to call EMS.  She had 1 small bowel movement this morning before  coming to hospital.  Denied any weight loss. ED Course: Afebrile, nontachycardic blood pressure 140/70 O2 saturation 96% on room air.  CT abdominal pelvis showed high-grade SBO with transition point in right lower quadrant likely adhesive etiology.  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  Recommend outpatient MRI.  Glucose 204, normal LFT and kidney function.  Review of Systems: As per HPI otherwise 14 point review of systems negative.    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   Leukopenia 05/18/2014   Mass of axilla 01/05/2012   Measles as a child   Medicare annual wellness visit, subsequent 01/05/2012   Sees Dr Kristie of Gastroenterology    Neutropenia 10/07/2016   Osteopenia 01/05/2012   Other and unspecified hyperlipidemia 05/18/2014   Pain of left heel 04/07/2017   Preventative health care 01/05/2012   Preventative health care 10/07/2016   Skin cancer 12/02/2009   Thyroid  disease 03/16/2012   right    Vitamin D  deficiency 04/07/2017    Past Surgical History:  Procedure Laterality Date   biopsy of jawline  2011   CATARACT EXTRACTION, BILATERAL     b/l   laproscopy  1970   abdominal, endometriosis, with D&C   right hip replacement  2003   TONSILLECTOMY AND ADENOIDECTOMY  1949     reports that she has never smoked. She has never used smokeless tobacco. She reports current alcohol use. She reports that she does not use drugs.  Allergies  Allergen Reactions   Bee Venom Anaphylaxis   Penicillamine Other (See Comments)   Penicillins Hives    Has patient had a PCN reaction causing immediate rash, facial/tongue/throat swelling, SOB or lightheadedness with hypotension: No Has patient had a PCN reaction causing severe rash involving mucus membranes or skin necrosis: No Has patient had a PCN reaction that required hospitalization No Has patient had a PCN  reaction occurring within the last 10 years: No If all of the above answers are NO, then may proceed with Cephalosporin use.   Shellfish Allergy     Family History  Problem Relation Age of Onset   Arthritis Mother    Hypertension Mother    Dementia Mother    Heart attack Mother    Glaucoma Mother    Heart disease Mother        MI, stent in 2004   Emphysema Father        smoker   Hypertension Father    Glaucoma Father    COPD  Father    Osteoporosis Sister        Per [facility]  New Patient packet   Ulcers Maternal Grandmother    Other Paternal Grandfather        enlarged heart     Prior to Admission medications   Medication Sig Start Date End Date Taking? Authorizing Provider  Ascorbic Acid (VITAMIN C) 100 MG tablet Take 500 mg by mouth daily. 02/07/20  Yes [provider]  latanoprost (XALATAN) 0.005 % ophthalmic solution Place 1 drop into both eyes at bedtime. 05/26/21  Yes [provider]  levothyroxine  (SYNTHROID ) 75 MCG tablet Take 1 tablet (75 mcg total) by mouth daily. 03/26/24  Yes Beamer, Richerd, MD  Misc Natural Products (COLON CARE PO) Take 1 capsule by mouth at bedtime. Colon Max   Yes [provider]  timolol (TIMOPTIC) 0.5 % ophthalmic solution Place 1 drop into both eyes daily. 05/19/23  Yes [provider]  Vitamin D -Vitamin K (VITAMIN D2 + K1 PO) Take 1 capsule by mouth daily.   Yes [provider]  OVER THE COUNTER MEDICATION Take 1 capsule by mouth daily. Ioderol Patient not taking: Reported on 10/01/2024    [provider]    Physical Exam: Vitals:   10/01/24 0456 10/01/24 0900 10/01/24 0915 10/01/24 0935  BP:  (!) 140/74    Pulse:  68 70   Resp:  (!) 29 18   Temp: (!) 97.5 F (36.4 C)   98.1 F (36.7 C)  TempSrc: Oral   Oral  SpO2:  96% 93%   Weight:      Height:        Constitutional: NAD, calm, comfortable Vitals:   10/01/24 0456 10/01/24 0900 10/01/24 0915 10/01/24 0935  BP:  (!) 140/74    Pulse:  68 70   Resp:  (!) 29 18   Temp: (!) 97.5 F (36.4 C)   98.1 F (36.7 C)  TempSrc: Oral   Oral  SpO2:  96% 93%   Weight:      Height:       Eyes: PERRL, lids and conjunctivae normal ENMT: Mucous membranes are dry. Posterior pharynx clear of any exudate or lesions.Normal dentition.  Neck: normal, supple, no masses, no thyromegaly Respiratory: clear to auscultation bilaterally, no wheezing, no crackles. Normal respiratory  effort. No accessory muscle use.  Cardiovascular: Regular rate and rhythm, no murmurs / rubs / gallops. No extremity edema. 2+ pedal pulses. No carotid bruits.  Abdomen: Mild tenderness on periumbilical area, no rebound no guarding, no masses palpated. No hepatosplenomegaly. Bowel sounds positive.  Musculoskeletal: no clubbing / cyanosis. No joint deformity upper and lower extremities. Good ROM, no contractures. Normal muscle tone.  Skin: no rashes, lesions, ulcers. No induration Neurologic: CN 2-12 grossly intact. Sensation intact, DTR normal. Strength 5/5 in all 4.  Psychiatric: Normal judgment and insight. Alert and oriented x  3. Normal mood.     Labs on Admission: I have personally reviewed following labs and imaging studies  CBC: Recent Labs  Lab 10/01/24 0500  WBC 12.1*  NEUTROABS 9.8*  HGB 13.0  HCT 39.5  MCV 90.2  PLT 336   Basic Metabolic Panel: Recent Labs  Lab 10/01/24 0500  NA 142  K 4.0  CL 99  CO2 25  GLUCOSE 204*  BUN 21  CREATININE 1.12*  CALCIUM 9.9   GFR: Estimated Creatinine Clearance: 37 mL/min (A) (by C-G formula based on SCr of 1.12 mg/dL (H)). Liver Function Tests: Recent Labs  Lab 10/01/24 0500  AST 31  ALT 15  ALKPHOS 75  BILITOT 0.9  PROT 7.4  ALBUMIN 3.7   Recent Labs  Lab 10/01/24 0500  LIPASE 41   No results for input(s): AMMONIA in the last 168 hours. Coagulation Profile: No results for input(s): INR, PROTIME in the last 168 hours. Cardiac Enzymes: No results for input(s): CKTOTAL, CKMB, CKMBINDEX, TROPONINI in the last 168 hours. BNP (last 3 results) No results for input(s): PROBNP in the last 8760 hours. HbA1C: No results for input(s): HGBA1C in the last 72 hours. CBG: No results for input(s): GLUCAP in the last 168 hours. Lipid Profile: No results for input(s): CHOL, HDL, LDLCALC, TRIG, CHOLHDL, LDLDIRECT in the last 72 hours. Thyroid  Function Tests: No results for input(s): TSH,  T4TOTAL, FREET4, T3FREE, THYROIDAB in the last 72 hours. Anemia Panel: No results for input(s): VITAMINB12, FOLATE, FERRITIN, TIBC, IRON, RETICCTPCT in the last 72 hours. Urine analysis:    Component Value Date/Time   COLORURINE YELLOW (A) 06/12/2021 2245   APPEARANCEUR CLEAR (A) 06/12/2021 2245   LABSPEC 1.012 06/12/2021 2245   PHURINE 9.0 (H) 06/12/2021 2245   GLUCOSEU NEGATIVE 06/12/2021 2245   HGBUR NEGATIVE 06/12/2021 2245   BILIRUBINUR NEGATIVE 06/12/2021 2245   KETONESUR NEGATIVE 06/12/2021 2245   PROTEINUR NEGATIVE 06/12/2021 2245   NITRITE NEGATIVE 06/12/2021 2245   LEUKOCYTESUR SMALL (A) 06/12/2021 2245    Radiological Exams on Admission: CT ABDOMEN PELVIS W CONTRAST Result Date: 10/01/2024 EXAM: CT ABDOMEN AND PELVIS WITH CONTRAST 10/01/2024 06:19:20 AM TECHNIQUE: CT of the abdomen and pelvis was performed with the administration of 100 mL of iohexol  (OMNIPAQUE ) 300 MG/ML solution. Multiplanar reformatted images are provided for review. Automated exposure control, iterative reconstruction, and/or weight-based adjustment of the mA/kV was utilized to reduce the radiation dose to as low as reasonably achievable. COMPARISON: CT with contrast 06/13/2021. CLINICAL HISTORY: FINDINGS: LOWER CHEST: Small hiatal hernia. Scattered linear scar-like opacities in both lung bases without infiltrates. The cardiac size is normal. There are multiple eventrations along the right hemidiaphragm. LIVER: The liver is unremarkable. There is a small amount of perihepatic ascites. GALLBLADDER AND BILE DUCTS: Gallbladder is unremarkable. No biliary ductal dilatation. SPLEEN: No acute abnormality. There is a small amount of perisplenic ascites. PANCREAS: In the inferior body of the pancreas in the midline, there is a 1.1 x 0.7 x 1 cm low density lesion measuring 17.5 Hounsfield units. This was seen previously and probably either a cyst or a main duct IPMN. This has never been characterized  by MRI. Nonemergent follow-up MRI without and with contrast is recommended. Remainder of the pancreas is unremarkable. ADRENAL GLANDS: No acute abnormality. KIDNEYS, URETERS AND BLADDER: No stones in the kidneys or ureters. No hydronephrosis. No perinephric or periureteral stranding. The bladder is obscured by spray artifacts from right hip replacement and unable to be evaluated. GI AND BOWEL: There is  fluid filling in the stomach and backup or reflux into the distal esophagus. The junction of the duodenum and jejunum is in the right mid abdomen in this patient consistent with a midgut malrotation. Beginning in the jejunum there is diffuse dilatation of the small bowel up to 3.5 cm down to the subcecal right lower quadrant, where there are feces-filled segments and a transition from dilated to decompressed caliber on axial image 51 of series 2. There is a sharply angulated segment in this location with the most likely obstructive etiology being adhesive disease. The downstream terminal ileal segments are collapsed consistent with a high-grade obstruction. There is no bowel pneumatosis or free air. There are some mild mesenteric congestive changes in the pelvis. PERITONEUM AND RETROPERITONEUM: There is a small amount of perisplenic and perihepatic ascites and scattered interloop ascites between dilated small bowel in the abdomen, with minimal ascites in the deep pelvis. No free air. VASCULATURE: Aorta is normal in caliber. There is patchy aortoiliac calcific plaque without aneurysm or dissection. LYMPH NODES: No lymphadenopathy. REPRODUCTIVE ORGANS: The uterus is surgically absent. No adnexal mass is seen. BONES AND SOFT TISSUES: Osteopenia. Right hip arthroplasty. Mild degenerative change lumbar spine. No acute osseous abnormality. No focal soft tissue abnormality. IMPRESSION: 1. High-grade small bowel obstruction with transition in the subcecal right lower quadrant, likely adhesive etiology. No bowel pneumatosis or  free air. Associated mild mesenteric congestion and small-volume ascites. 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 3. Indeterminate 1.1 x 0.7 x 1.0 cm low-attenuation lesion in the inferior pancreatic body, stable from prior. 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 Recommend nonemergent MRI with and without contrast for characterization. 4. Aortic atherosclerosis. Electronically signed by: Francis Quam MD 10/01/2024 07:33 AM EDT RP Workstation: [id number]    EKG: None  Assessment/Plan Principal Problem:   SBO (small bowel obstruction) (HCC)  (please populate well all problems here in Problem List. (For example, if patient is on BP meds at home and you resume or decide to hold them, it is a problem that needs to be her. Same for CAD, COPD, HLD and so on)   SBO - Will try conservative management first, n.p.o. IV fluid - NGT - Surgical consultation to follow  Hyperglycemia - Most recent A1c 5.83 months ago, unlikely new onset of diabetes - Recheck glucose,  Leukocytosis - Likely reactive to SBO, no cough, no urinary symptoms no diarrhea.  Monitor off antibiotics.  Repeat CBC tomorrow.  Pancreatic lesion - Outpatient follow-up with GI for MRCP - Check cancer antigen CA 27.  29 and CEA  Hypothyroidism - Resume Synthroid  once able to take p.o.  DVT prophylaxis: Lovenox Code Status: Full code Family Communication: Neighbor/close friend at bedside Disposition Plan: Patient is sick with SBO requiring NGT IV fluid n.p.o. and inpatient surgical consultation Consults called: General Surgeon Admission status: Telemetry admission   Cort ONEIDA Mana MD [facility] Pager [phone]  10/01/2024, 10:22 AM

## 2024-10-01 NOTE — Consult Note (Signed)
 [facility] SURGICAL CONSULTATION NOTE (initial) - cpt: [phone]   HISTORY OF PRESENT ILLNESS (HPI):  83 y.o. female presented to [facility] ED early this AM for evaluation of abdominal pain. Patient reports after eating last night she noticed significant increase in bloating and nausea. She ultimately had multiple episodes of emesis and developed generalized abdominal discomfort. Reportedly has noted similar episodes in the past but these had been less severe and resolved on their own. No reported fever, chills, CP, SOB, urinary changes. She does report she has had bowel function and continues to pass flatus. Previous abdominal surgeries positive for laparoscopy for endometriosis. She does not believe she ever needed resections; only seems to have umbilical scar. Work up in the ED revealed a leukocytosis to 12.1K, Hgb to 13.0, sCr - 1.12, no electrolyte derangements. CT Abdomen/Pelvis was obtained and noted dilated small bowel with transition in RLQ. Question of midgut malrotation although duodenum does seem to cross midline. There is also mention of pancreatic cyst vs IPNM. She was admitted to medicine service. NGT placed in ED.   Surgery is consulted by emergency medicine physician Dr. Clotilda Punter, MD in this context for evaluation and management of SBO.  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   Leukopenia 05/18/2014   Mass of axilla 01/05/2012   Measles as a child   Medicare annual wellness visit, subsequent 01/05/2012   Sees Dr Kristie of Gastroenterology    Neutropenia 10/07/2016   Osteopenia 01/05/2012   Other and unspecified hyperlipidemia 05/18/2014   Pain of left heel 04/07/2017   Preventative health care 01/05/2012   Preventative health  care 10/07/2016   Skin cancer 12/02/2009   Thyroid  disease 03/16/2012   right    Vitamin D  deficiency 04/07/2017     PAST SURGICAL HISTORY [facility]):  Past Surgical History:  Procedure Laterality Date   biopsy of jawline  2011   CATARACT EXTRACTION, BILATERAL     b/l   laproscopy  1970   abdominal, endometriosis, with D&C   right hip replacement  2003   TONSILLECTOMY AND ADENOIDECTOMY  1949     MEDICATIONS:  Prior to Admission medications   Medication Sig Start Date End Date Taking? Authorizing Provider  Ascorbic Acid (VITAMIN C) 100 MG tablet Take 500 mg by mouth daily. 02/07/20   [provider]  latanoprost (XALATAN) 0.005 % ophthalmic solution Place 1 drop into both eyes at bedtime. 05/26/21   [provider]  levothyroxine  (SYNTHROID ) 75 MCG tablet Take 1 tablet (75 mcg total) by mouth daily. 03/26/24   Abdul Fine, MD  Misc Natural Products (COLON CARE PO) Take 1 capsule by mouth at bedtime. Colon Max    [provider]  OVER THE COUNTER MEDICATION Take 1 capsule by mouth daily. Ioderol    [provider]  timolol (TIMOPTIC) 0.5 % ophthalmic solution Place 1 drop into both eyes daily. 05/19/23   [provider]  Vitamin D -Vitamin K (VITAMIN D2 + K1 PO) Take 1 capsule by mouth daily.    [provider]     ALLERGIES:  Allergies  Allergen Reactions   Bee Venom Anaphylaxis   Penicillamine Other (See Comments)   Penicillins Hives  Has patient had a PCN reaction causing immediate rash, facial/tongue/throat swelling, SOB or lightheadedness with hypotension: No Has patient had a PCN reaction causing severe rash involving mucus membranes or skin necrosis: No Has patient had a PCN reaction that required hospitalization No Has patient had a PCN reaction occurring within the last 10 years: No If all of the above answers are NO, then may proceed with Cephalosporin use.   Shellfish Allergy      SOCIAL HISTORY:  Social History    Socioeconomic History   Marital status: Divorced    Spouse name: Not on file   Number of children: Not on file   Years of education: Not on file   Highest education level: Doctorate  Occupational History   Not on file  Tobacco Use   Smoking status: Never   Smokeless tobacco: Never  Vaping Use   Vaping status: Never Used  Substance and Sexual Activity   Alcohol use: Yes    Comment: occasional wine   Drug use: No   Sexual activity: Never    Partners: Female    Comment: lives with partner, no major dietary restrictions.   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      Family: sister in [place] and Daughter in [place], [place] - 1 grandchild      Enjoys: yoga, pickle ball, travel, the arts      Exercise: daily   Diet: generally healthy      Safety   Seat belts: Yes    Guns: No   Safe in relationships: Yes       Social Drivers of Corporate Investment Banker Strain: Low Risk  (06/21/2024)   Overall Financial Resource Strain (CARDIA)    Difficulty of Paying Living Expenses: Not hard at all  Food Insecurity: No Food Insecurity (06/21/2024)   Hunger Vital Sign    Worried About Running Out of Food in the Last Year: Never true    Ran Out of Food in the Last Year: Never true  Transportation Needs: No Transportation Needs (06/21/2024)   PRAPARE - Administrator, Civil Service (Medical): No    Lack of Transportation (Non-Medical): No  Physical Activity: Sufficiently Active (06/21/2024)   Exercise Vital Sign    Days of Exercise per Week: 3 days    Minutes of Exercise per Session: 60 min  Stress: Stress Concern Present (06/21/2024)   [employer] of Occupational Health - Occupational Stress Questionnaire    Feeling of Stress: To some extent  Social Connections: Moderately  Integrated (06/21/2024)   Social Connection and Isolation Panel    Frequency of Communication with Friends and Family: More than three times a week    Frequency of Social Gatherings with Friends and Family: More than three times a week    Attends Religious Services: More than 4 times per year    Active Member of [employer] or Organizations: Yes    Attends Banker Meetings: More than 4 times per year    Marital Status: Widowed  Intimate Partner Violence: Not At Risk (11/01/2021)   Humiliation, Afraid, Rape, and Kick questionnaire    Fear of Current or Ex-Partner: No    Emotionally Abused: No    Physically Abused: No    Sexually Abused:  No     FAMILY HISTORY:  Family History  Problem Relation Age of Onset   Arthritis Mother    Hypertension Mother    Dementia Mother    Heart attack Mother    Glaucoma Mother    Heart disease Mother        MI, stent in 2004   Emphysema Father        smoker   Hypertension Father    Glaucoma Father    COPD Father    Osteoporosis Sister        Per [facility]  New Patient packet   Ulcers Maternal Grandmother    Other Paternal Grandfather        enlarged heart      REVIEW OF SYSTEMS:  Review of Systems  Constitutional:  Negative for chills and fever.  Cardiovascular:  Negative for chest pain and palpitations.  Gastrointestinal:  Positive for abdominal pain, nausea and vomiting. Negative for blood in stool, constipation and diarrhea.  Genitourinary:  Negative for dysuria and urgency.  All other systems reviewed and are negative.   VITAL SIGNS:  Temp:  [97.5 F (36.4 C)-100 F (37.8 C)] 100 F (37.8 C) (10/31 1137) Pulse Rate:  [62-70] 62 (10/31 1137) Resp:  [18-29] 20 (10/31 1137) BP: (131-174)/(65-74) 174/65 (10/31 1137) SpO2:  [93 %-100 %] 96 % (10/31 1137) Weight:  [66.8 kg] 66.8 kg (10/31 0452)     Height: 5' 4 (162.6 cm) Weight: 66.8 kg BMI (Calculated): 25.27   INTAKE/OUTPUT:  No intake/output data recorded.  PHYSICAL EXAM:   Physical Exam Vitals and nursing note reviewed. Exam conducted with a chaperone present.  Constitutional:      General: She is not in acute distress.    Appearance: She is well-developed and normal weight. She is not ill-appearing.  HENT:     Head: Normocephalic and atraumatic.     Comments: NGT in place Eyes:     General: No scleral icterus.    Extraocular Movements: Extraocular movements intact.  Cardiovascular:     Rate and Rhythm: Normal rate and regular rhythm.  Pulmonary:     Effort: Pulmonary effort is normal. No respiratory distress.  Abdominal:     General: Abdomen is flat. A surgical scar is present. There is no distension.     Palpations: Abdomen is soft.     Tenderness: There is no abdominal tenderness. There is no guarding or rebound.     Comments: Abdomen is soft, she does not appear overly tender, non-distended, no rebound/guarding  Genitourinary:    Comments: Deferred Skin:    General: Skin is warm and dry.  Neurological:     General: No focal deficit present.     Mental Status: She is alert and oriented to person, place, and time.  Psychiatric:        Mood and Affect: Mood normal.        Behavior: Behavior normal.      Labs:     Latest Ref Rng & Units 10/01/2024    5:00 AM 12/18/2023    7:35 AM 01/23/2023   12:00 AM  CBC  WBC 4.0 - 10.5 K/uL 12.1  4.2  4.5      Hemoglobin 12.0 - 15.0 g/dL 86.9  87.6  86.3      Hematocrit 36.0 - 46.0 % 39.5  38.9  41      Platelets 150 - 400 K/uL 336  312  219         This result  is from an external source.      Latest Ref Rng & Units 10/01/2024    5:00 AM 06/28/2024    8:08 AM 02/02/2024    7:55 AM  CMP  Glucose 70 - 99 mg/dL 795  78  85   BUN 8 - 23 mg/dL 21  21  14    Creatinine 0.44 - 1.00 mg/dL 8.87  9.07  9.02   Sodium 135 - 145 mmol/L 142  138  136   Potassium 3.5 - 5.1 mmol/L 4.0  4.2  4.5   Chloride 98 - 111 mmol/L 99  104  102   CO2 22 - 32 mmol/L 25  28  31    Calcium 8.9 - 10.3 mg/dL 9.9  9.2    9.2   9.1   Total Protein 6.5 - 8.1 g/dL 7.4  6.2    Total Bilirubin 0.0 - 1.2 mg/dL 0.9  0.5    Alkaline Phos 38 - 126 U/L 75     AST 15 - 41 U/L 31  18    ALT 0 - 44 U/L 15  19       Imaging studies:   CT Abdomen/Pelvis (10/01/2024) personally reviewed with small bowel dilation, transition in RLQ, no free air, and radiologist report reviewed below:  IMPRESSION: 1. High-grade small bowel obstruction with transition in the subcecal right lower quadrant, likely adhesive etiology. No bowel pneumatosis or free air. Associated mild mesenteric congestion and small-volume ascites. 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 3. Indeterminate 1.1 x 0.7 x 1.0 cm low-attenuation lesion in the inferior pancreatic body, stable from prior. 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 Recommend nonemergent MRI with and without contrast for characterization. 4. Aortic atherosclerosis.    Assessment/Plan: (ICD-10's: K16.609) 83 y.o. female with SBO likely secondary to adhesions.   - Appreciate medicine admission - Agree with NGT decompression; LIS; monitor and record output  - No need for emergent surgical intervention; She understands if she fails to improve, or deteriorates in any fashion, we will need to consider surgical intervention.  - I do think duodenum does cross the midline on imaging, I do not think there is malrotation - Monitor abdominal examination; on-going bowel function   - NPO; IVF - Serial KUB; consider gastrografin in failure to improve    - Pain control prn; antiemetics prn   - Mobilize as tolerated; Okay to clamp NGT to allow this   - Further management per primary service; we will follow   All of the above findings and recommendations were discussed with the patient, and all of patient's questions were answered to her expressed satisfaction.  Thank you for the opportunity to participate in this patient's care.    -- Arthea Platt, PA-C [facility] 10/01/2024, 11:49 AM M-F: 7am - 4pm

## 2024-10-01 NOTE — ED Provider Notes (Addendum)
 Care assumed of patient from outgoing provider.  See their note for initial history, exam and plan.  Clinical Course as of 10/01/24 9188  Fri Oct 01, 2024  0701 Troponin I (High Sensitivity): 5 [CF]  [phone] Transferring ED care to Dr. Suzanne to follow up on CT and disposition appropriately. [CF]  0810 Dr. Marinda is currently in the OR will come in and evaluate the patient.  Patient currently resting comfortably and does not need any further pain medication and no further episodes of nausea or vomiting. [SM]    Clinical Course User Index [CF] Gordan Huxley, MD [SM] Suzanne Kirsch, MD   No past medical history significant for prior abdominal surgeries.  Sudden onset of generalized abdominal pain with nausea and vomiting.  Multiple intermittent episodes in the past but never this severe.  Lab work with mild leukocytosis.  Creatinine appears to be close to her baseline.  Hyperglycemia of 200.  Does have an elevated anion gap but no signs of acidosis and has a CO2 of 25.  Potassium is 4.  Normal LFTs and lipase.  Troponin is negative.  CT scan concerning for high-grade small bowel obstruction with transition to the right lower quadrant and concern for midgut rotation of the right mid abdomen.  Patient's pain is well-controlled at this time.  Reaching out to general surgery with Dr. Marinda.  Plan for NG tube.  Recommended admission to the hospitalist.  Consulting hospitalist for admission for high-grade small bowel obstruction   Suzanne Kirsch, MD 10/01/24 9276    Suzanne Kirsch, MD 10/01/24 9249    Suzanne Kirsch, MD 10/01/24 [phone]

## 2024-10-01 NOTE — ED Triage Notes (Signed)
 Chief Complaint  Patient presents with   Emesis   Abdominal Pain   Pt arrives from [facility]. Reports she began vomiting last night around 2000. Pt began to c/o abdominal pain upon transport to ED.

## 2024-10-01 NOTE — ED Provider Notes (Signed)
 [facility] Provider Note    Event Date/Time   First MD Initiated Contact with Patient 10/01/24 [phone]     (approximate)   History   Emesis and Abdominal Pain   HPI Angela Buck is a 83 y.o. female who presents by EMS from her independent living facility for evaluation of generalized abdominal pain and nausea and vomiting.  She said that this has happened to her several times before but has never been this severe and usually it resolves on its own after she throws up for a little bit.  Tonight it started acutely after she was eating and she immediately felt bloated and distended.  She has had numerous episodes of vomiting and has generalized abdominal pain and cramping.  She still feels like her abdomen is swollen.  She is not having any chest pain or shortness of breath.  She describes the symptoms as severe.  She says she has been evaluated for similar symptoms in the past and nothing was determined to be the cause.     Physical Exam   Triage Vital Signs: ED Triage Vitals  Encounter Vitals Group     BP 10/01/24 0455 131/69     Girls Systolic BP Percentile --      Girls Diastolic BP Percentile --      Boys Systolic BP Percentile --      Boys Diastolic BP Percentile --      Pulse Rate 10/01/24 0455 63     Resp 10/01/24 0455 18     Temp 10/01/24 0456 (!) 97.5 F (36.4 C)     Temp Source 10/01/24 0456 Oral     SpO2 10/01/24 0455 100 %     Weight 10/01/24 0452 66.8 kg (147 lb 4.3 oz)     Height 10/01/24 0452 1.626 m (5' 4)     Head Circumference --      Peak Flow --      Pain Score 10/01/24 0452 7     Pain Loc --      Pain Education --      Exclude from Growth Chart --     Most recent vital signs: Vitals:   10/01/24 0455 10/01/24 0456  BP: 131/69   Pulse: 63   Resp: 18   Temp:  (!) 97.5 F (36.4 C)  SpO2: 100%     General: Awake, alert, pleasant and conversant, but appears to be very uncomfortable and in pain CV:  Good peripheral  perfusion.  Regular rate and rhythm.  Normal heart sounds. Resp:  Normal effort. Speaking easily and comfortably, no accessory muscle usage nor intercostal retractions.  Lungs are clear to auscultation bilaterally. Abd:  Possible mild distention, difficult to appreciate, but the abdomen is soft yet tender to palpation throughout with most of the tenderness being centered upon the umbilicus.  Somewhat of a tympanitic feel.   ED Results / Procedures / Treatments   Labs (all labs ordered are listed, but only abnormal results are displayed) Labs Reviewed  CBC WITH DIFFERENTIAL/PLATELET - Abnormal; Notable for the following components:      Result Value   WBC 12.1 (*)    Neutro Abs 9.8 (*)    All other components within normal limits  COMPREHENSIVE METABOLIC PANEL WITH GFR - Abnormal; Notable for the following components:   Glucose, Bld 204 (*)    Creatinine, Ser 1.12 (*)    GFR, Estimated 49 (*)    Anion gap 18 (*)  All other components within normal limits  LIPASE, BLOOD  TROPONIN I (HIGH SENSITIVITY)      RADIOLOGY See ED course for details   PROCEDURES:  Critical Care performed: No  .1-3 Lead EKG Interpretation  Performed by: Gordan Huxley, MD Authorized by: Gordan Huxley, MD     Interpretation: normal     ECG rate:  65   ECG rate assessment: normal     Rhythm: sinus rhythm     Ectopy: none     Conduction: normal       IMPRESSION / MDM / ASSESSMENT AND PLAN / ED COURSE  I reviewed the triage vital signs and the nursing notes.                              Differential diagnosis includes, but is not limited to, SBO/ileus, nonspecific gastritis, reflux, pancreatitis, gallbladder disease, less likely atypical ACS presentation  Patient's presentation is most consistent with acute presentation with potential threat to life or bodily function.  Labs/studies ordered: CMP, CBC with differential, high-sensitivity troponin, lipase, CT of the abdomen and  pelvis  Interventions/Medications given:  Medications  morphine (PF) 4 MG/ML injection 4 mg (4 mg Intravenous Patient Refused/Not Given 10/01/24 0630)  ondansetron  (ZOFRAN ) injection 4 mg (4 mg Intravenous Given 10/01/24 0519)  metoCLOPramide (REGLAN) injection 10 mg (10 mg Intravenous Given 10/01/24 0629)  iohexol  (OMNIPAQUE ) 300 MG/ML solution 100 mL (100 mLs Intravenous Contrast Given 10/01/24 0611)    (Note:  hospital course my include additional interventions and/or labs/studies not listed above.)   Vital signs are stable but the patient is obviously uncomfortable.  I initially ordered Zofran  4 mg IV while she was waiting but that did not completely help the symptoms.  I then ordered Reglan 4 mg IV and morphine 4 mg IV but the patient refused the morphine.  Labs are notable for a slightly elevated anion gap at 18, creatinine of 1.12 representing a mild AKI, and a glucose of 204.  She has a leukocytosis of 12.1.  High sensitive troponin is pending but lipase is normal.  CT pending, symptoms are most consistent with ileus or SBO.  I ordered the bag of 500 mL of normal saline start by EMS to be completed for some fluid resuscitation.  The patient is on the cardiac monitor to evaluate for evidence of arrhythmia and/or significant heart rate changes.   Clinical Course as of 10/01/24 0706  Fri Oct 01, 2024  0701 Troponin I (High Sensitivity): 5 [CF]  [phone] Transferring ED care to Dr. Suzanne to follow up on CT and disposition appropriately. [CF]    Clinical Course User Index [CF] Gordan Huxley, MD     FINAL CLINICAL IMPRESSION(S) / ED DIAGNOSES   Final diagnoses:  Generalized abdominal pain  Nausea and vomiting, unspecified vomiting type     Rx / DC Orders   ED Discharge Orders     None        Note:  This document was prepared using Dragon voice recognition software and may include unintentional dictation errors.   Gordan Huxley, MD 10/01/24 [phone]

## 2024-10-02 ENCOUNTER — Inpatient Hospital Stay

## 2024-10-02 DIAGNOSIS — I4891 Unspecified atrial fibrillation: Secondary | ICD-10-CM | POA: Diagnosis not present

## 2024-10-02 DIAGNOSIS — N1831 Chronic kidney disease, stage 3a: Secondary | ICD-10-CM | POA: Diagnosis not present

## 2024-10-02 DIAGNOSIS — K8689 Other specified diseases of pancreas: Secondary | ICD-10-CM | POA: Insufficient documentation

## 2024-10-02 DIAGNOSIS — I480 Paroxysmal atrial fibrillation: Secondary | ICD-10-CM | POA: Insufficient documentation

## 2024-10-02 DIAGNOSIS — K56609 Unspecified intestinal obstruction, unspecified as to partial versus complete obstruction: Secondary | ICD-10-CM | POA: Diagnosis not present

## 2024-10-02 LAB — CBC
HCT: 35.3 % — ABNORMAL LOW (ref 36.0–46.0)
Hemoglobin: 11.4 g/dL — ABNORMAL LOW (ref 12.0–15.0)
MCH: 30 pg (ref 26.0–34.0)
MCHC: 32.3 g/dL (ref 30.0–36.0)
MCV: 92.9 fL (ref 80.0–100.0)
Platelets: 259 K/uL (ref 150–400)
RBC: 3.8 MIL/uL — ABNORMAL LOW (ref 3.87–5.11)
RDW: 12.7 % (ref 11.5–15.5)
WBC: 8 K/uL (ref 4.0–10.5)
nRBC: 0 % (ref 0.0–0.2)

## 2024-10-02 LAB — BASIC METABOLIC PANEL WITH GFR
Anion gap: 9 (ref 5–15)
BUN: 14 mg/dL (ref 8–23)
CO2: 26 mmol/L (ref 22–32)
Calcium: 8.4 mg/dL — ABNORMAL LOW (ref 8.9–10.3)
Chloride: 104 mmol/L (ref 98–111)
Creatinine, Ser: 1.08 mg/dL — ABNORMAL HIGH (ref 0.44–1.00)
GFR, Estimated: 52 mL/min — ABNORMAL LOW (ref >60)
Glucose, Bld: 107 mg/dL — ABNORMAL HIGH (ref 70–99)
Potassium: 3.6 mmol/L (ref 3.5–5.1)
Sodium: 139 mmol/L (ref 135–145)

## 2024-10-02 MED ORDER — METOPROLOL SUCCINATE ER 25 MG PO TB24
25.0000 mg | ORAL_TABLET | Freq: Every day | ORAL | Status: DC
Start: 2024-10-02 — End: 2024-10-03
  Administered 2024-10-02: 25 mg via ORAL
  Filled 2024-10-02 (×2): qty 1

## 2024-10-02 MED ORDER — METOPROLOL TARTRATE 5 MG/5ML IV SOLN
5.0000 mg | Freq: Four times a day (QID) | INTRAVENOUS | Status: DC | PRN
  Administered 2024-10-02 – 2024-10-03 (×2): 5 mg via INTRAVENOUS
  Filled 2024-10-02 (×2): qty 5

## 2024-10-02 MED ORDER — LACTATED RINGERS IV SOLN: INTRAVENOUS | Status: DC

## 2024-10-02 MED ORDER — BISACODYL 10 MG RE SUPP
10.0000 mg | Freq: Once | RECTAL | Status: AC
  Administered 2024-10-02: 10 mg via RECTAL
  Filled 2024-10-02: qty 1

## 2024-10-02 NOTE — Progress Notes (Addendum)
  Progress Note   Patient: Angela Buck FMW:[id number] DOB: 22-Nov-1941 DOA: 10/01/2024     1 DOS: the patient was seen and examined on 10/02/2024   Brief hospital course: Angela Buck is a 83 y.o. female with medical history significant of hypothyroidism, HLD, SBO presented with worsening of nauseous vomiting abdominal pain.  Symptoms started 3 days ago.  CT scan showed small bowel obstruction, with transition point in the right upper quadrant. Patient is seen by general surgery, NG suction was started on admission. Condition improving, patient has passed gas.  General surgery has clamped NGT tube in the morning of/11/1.   Principal Problem:   SBO (small bowel obstruction) (HCC) Active Problems:   Chronic kidney disease, stage 3a (HCC)   Pancreatic mass   Assessment and Plan: Small bowel obstruction.  Most likely secondary to adhesion. Patient had a laparoscopy 20 years ago. Last colonoscopy was 5 years ago with removal of 2 polyps. Condition is improving, continue to follow-up with a gentle rehydration. X-ray and CT scan also showed a moderate stool burden in the colon, will give Dulcolax suppository.  Chronic kidney disease stage 3a. Patient has slightly worsening renal function, but does not meet criteria for AKI.  Pancreatic mass. Incidental finding, CT scan reporting suspect cystic lesion. Advised patient to follow-up with PCP for a none emergent MRI  Addendum: 1718. Patient developed atrial fibrillation with RVR, no history of atrial fibrillation.  But currently asymptomatic.  Blood pressure still stable, no chest pain or shortness of breath.  Lungs are clear, heart irregular with tachycardia.  Abdomen soft without tenderness or distention. For now, patient heart rate is around 100-120, went up to 150 we walked to the bathroom.  Will give as needed metoprolol IV as well as added metoprolol orally.  Hopefully patient will convert by self.  Obtain  echocardiogram.   Subjective:  Patient feels better today, no nausea vomiting.  Has passed gas, no bowel movement.  Physical Exam: Vitals:   10/01/24 1137 10/01/24 1503 10/01/24 2106 10/02/24 0357  BP: (!) 174/65 (!) 165/63 (!) 143/72 139/60  Pulse: 62 (!) 55 66 65  Resp: 20 19 18 17   Temp: 100 F (37.8 C) 98.2 F (36.8 C) 98.5 F (36.9 C) 98.4 F (36.9 C)  TempSrc: Oral  Oral   SpO2: 96% 96% 94% 97%  Weight:      Height:       General exam: Appears calm and comfortable  Respiratory system: Clear to auscultation. Respiratory effort normal. Cardiovascular system: S1 & S2 heard, RRR. No JVD, murmurs, rubs, gallops or clicks. No pedal edema. Gastrointestinal system: Abdomen is nondistended, soft and nontender. No organomegaly or masses felt. Normal bowel sounds heard. Central nervous system: Alert and oriented. No focal neurological deficits. Extremities: Symmetric 5 x 5 power. Skin: No rashes, lesions or ulcers Psychiatry: Judgement and insight appear normal. Mood & affect appropriate.    Data Reviewed:  CT scan results, lab results, abdominal film yesterday and today  Family Communication: None  Disposition: Status is: Inpatient Remains inpatient appropriate because: Severity of disease, IV treatment.     Time spent: 55 minutes  Author: Murvin Mana, MD 10/02/2024 10:25 AM  For on call review [email or web address].

## 2024-10-02 NOTE — Progress Notes (Signed)
 CC: SBO Subjective: Doing better Less distended Passing flatus Sore throat  Objective: Vital signs in last 24 hours: Temp:  [98.4 F (36.9 C)-99 F (37.2 C)] 99 F (37.2 C) (11/01 1657) Pulse Rate:  [65-106] 103 (11/01 1746) Resp:  [16-18] 18 (11/01 1657) BP: (136-165)/(54-100) 144/84 (11/01 1746) SpO2:  [94 %-99 %] 96 % (11/01 1746) Last BM Date : 10/02/24  Intake/Output from previous day: 10/31 0701 - 11/01 0700 In: 1969.9 [I.V.:1879.9; NG/GT:90] Out: 200 [Emesis/NG output:200] Intake/Output this shift: No intake/output data recorded.  Physical exam:  Soft, non-distended, non-tender abdomen, gastric content out from NGT  Lab Results: CBC  Recent Labs    10/01/24 0500 10/02/24 0536  WBC 12.1* 8.0  HGB 13.0 11.4*  HCT 39.5 35.3*  PLT 336 259   BMET Recent Labs    10/01/24 0500 10/02/24 0536  NA 142 139  K 4.0 3.6  CL 99 104  CO2 25 26  GLUCOSE 204* 107*  BUN 21 14  CREATININE 1.12* 1.08*  CALCIUM 9.9 8.4*   PT/INR No results for input(s): LABPROT, INR in the last 72 hours. ABG No results for input(s): PHART, HCO3 in the last 72 hours.  Invalid input(s): PCO2, PO2  Studies/Results: DG ABD ACUTE 2+V W 1V CHEST Result Date: 10/02/2024 CLINICAL DATA:  Small-bowel obstruction EXAM: DG ABDOMEN ACUTE WITH 1 VIEW CHEST COMPARISON:  CT abdomen and pelvis dated 10/01/2024 FINDINGS: Lines/tubes: Gastric/enteric tube tip projects over the stomach. Chest: Lungs are well inflated. No focal consolidations. Blunting of the bilateral costophrenic angles. No pneumothorax. Normal heart size. Abdomen: Mildly dilated right hemi abdominal small bowel. Moderate volume stool within the colon. No pneumatosis or free air. No abnormal calcification or mass effect. Bones: No acute osseous abnormality. Partially imaged right hip arthroplasty hardware appears intact. IMPRESSION: 1. Mildly dilated right hemi abdominal small bowel, which may represent ileus or reported  small bowel obstruction. 2. Moderate volume stool within the colon. 3. Blunting of the bilateral costophrenic angles, which may be secondary to well inflated lungs or represent trace pleural effusions. Electronically Signed   By: Limin  Xu M.D.   On: 10/02/2024 10:23   DG Abd Portable 1 View Result Date: 10/01/2024 EXAM: 1 VIEW XRAY OF THE ABDOMEN 10/01/2024 11:24:00 AM COMPARISON: None available. CLINICAL HISTORY: confirm NG placement confirm NG placement FINDINGS: LINES, TUBES AND DEVICES: Nasogastric tube tip is seen in proximal stomach. BOWEL: Small bowel dilatation is noted and left side of abdomen. SOFT TISSUES: No opaque urinary calculi. BONES: No acute osseous abnormality. IMPRESSION: 1. Small bowel dilatation in the left abdomen. 2. Nasogastric tube tip in the proximal stomach. Electronically signed by: Lynwood Seip MD 10/01/2024 12:01 PM EDT RP Workstation: [id number]   CT ABDOMEN PELVIS W CONTRAST Result Date: 10/01/2024 EXAM: CT ABDOMEN AND PELVIS WITH CONTRAST 10/01/2024 06:19:20 AM TECHNIQUE: CT of the abdomen and pelvis was performed with the administration of 100 mL of iohexol  (OMNIPAQUE ) 300 MG/ML solution. Multiplanar reformatted images are provided for review. Automated exposure control, iterative reconstruction, and/or weight-based adjustment of the mA/kV was utilized to reduce the radiation dose to as low as reasonably achievable. COMPARISON: CT with contrast 06/13/2021. CLINICAL HISTORY: FINDINGS: LOWER CHEST: Small hiatal hernia. Scattered linear scar-like opacities in both lung bases without infiltrates. The cardiac size is normal. There are multiple eventrations along the right hemidiaphragm. LIVER: The liver is unremarkable. There is a small amount of perihepatic ascites. GALLBLADDER AND BILE DUCTS: Gallbladder is unremarkable. No biliary ductal dilatation. SPLEEN: No acute abnormality.  There is a small amount of perisplenic ascites. PANCREAS: In the inferior body of the pancreas in  the midline, there is a 1.1 x 0.7 x 1 cm low density lesion measuring 17.5 Hounsfield units. This was seen previously and probably either a cyst or a main duct IPMN. This has never been characterized by MRI. Nonemergent follow-up MRI without and with contrast is recommended. Remainder of the pancreas is unremarkable. ADRENAL GLANDS: No acute abnormality. KIDNEYS, URETERS AND BLADDER: No stones in the kidneys or ureters. No hydronephrosis. No perinephric or periureteral stranding. The bladder is obscured by spray artifacts from right hip replacement and unable to be evaluated. GI AND BOWEL: There is fluid filling in the stomach and backup or reflux into the distal esophagus. The junction of the duodenum and jejunum is in the right mid abdomen in this patient consistent with a midgut malrotation. Beginning in the jejunum there is diffuse dilatation of the small bowel up to 3.5 cm down to the subcecal right lower quadrant, where there are feces-filled segments and a transition from dilated to decompressed caliber on axial image 51 of series 2. There is a sharply angulated segment in this location with the most likely obstructive etiology being adhesive disease. The downstream terminal ileal segments are collapsed consistent with a high-grade obstruction. There is no bowel pneumatosis or free air. There are some mild mesenteric congestive changes in the pelvis. PERITONEUM AND RETROPERITONEUM: There is a small amount of perisplenic and perihepatic ascites and scattered interloop ascites between dilated small bowel in the abdomen, with minimal ascites in the deep pelvis. No free air. VASCULATURE: Aorta is normal in caliber. There is patchy aortoiliac calcific plaque without aneurysm or dissection. LYMPH NODES: No lymphadenopathy. REPRODUCTIVE ORGANS: The uterus is surgically absent. No adnexal mass is seen. BONES AND SOFT TISSUES: Osteopenia. Right hip arthroplasty. Mild degenerative change lumbar spine. No acute osseous  abnormality. No focal soft tissue abnormality. IMPRESSION: 1. High-grade small bowel obstruction with transition in the subcecal right lower quadrant, likely adhesive etiology. No bowel pneumatosis or free air. Associated mild mesenteric congestion and small-volume ascites. 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 3. Indeterminate 1.1 x 0.7 x 1.0 cm low-attenuation lesion in the inferior pancreatic body, stable from prior. 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 Recommend nonemergent MRI with and without contrast for characterization. 4. Aortic atherosclerosis. Electronically signed by: Francis Quam MD 10/01/2024 07:33 AM EDT RP Workstation: [id number]    Anti-infectives: Anti-infectives (From admission, onward)    None       Assessment/Plan:  SBO Doing better, having bowel fx, gravity trial today and if less than 200ccs out after 30 minutes then can remove and start on cld  Jayson Endow, M.D. [facility]

## 2024-10-02 NOTE — Progress Notes (Signed)
 Notified by CCMD about patient's HR elevated in the 130-140.  VS checked  Pt had just used the restroom. MD Zhang notified and arrived to the room. New orders in, see MAR.

## 2024-10-02 NOTE — Hospital Course (Signed)
 Angela Buck is a 83 y.o. female with medical history significant of hypothyroidism, HLD, SBO presented with worsening of nauseous vomiting abdominal pain.  Symptoms started 3 days ago.  CT scan showed small bowel obstruction, with transition point in the right upper quadrant. Patient is seen by general surgery, NG suction was started on admission. Condition improving, patient has passed gas.  General surgery has clamped NGT tube in the morning of/11/1.

## 2024-10-03 DIAGNOSIS — I4891 Unspecified atrial fibrillation: Secondary | ICD-10-CM | POA: Diagnosis not present

## 2024-10-03 DIAGNOSIS — K8689 Other specified diseases of pancreas: Secondary | ICD-10-CM | POA: Diagnosis not present

## 2024-10-03 DIAGNOSIS — K56609 Unspecified intestinal obstruction, unspecified as to partial versus complete obstruction: Secondary | ICD-10-CM | POA: Diagnosis not present

## 2024-10-03 LAB — MAGNESIUM
Magnesium: 2.2 mg/dL (ref 1.7–2.4)
Magnesium: 1.8 mg/dL (ref 1.7–2.4)

## 2024-10-03 LAB — BASIC METABOLIC PANEL WITH GFR
Anion gap: 11 (ref 5–15)
BUN: 10 mg/dL (ref 8–23)
CO2: 23 mmol/L (ref 22–32)
Calcium: 8.5 mg/dL — ABNORMAL LOW (ref 8.9–10.3)
Chloride: 105 mmol/L (ref 98–111)
Creatinine, Ser: 1.03 mg/dL — ABNORMAL HIGH (ref 0.44–1.00)
GFR, Estimated: 55 mL/min — ABNORMAL LOW (ref >60)
Glucose, Bld: 89 mg/dL (ref 70–99)
Potassium: 4.5 mmol/L (ref 3.5–5.1)
Sodium: 139 mmol/L (ref 135–145)

## 2024-10-03 LAB — CEA: CEA: 1.5 ng/mL (ref 0.0–4.7)

## 2024-10-03 MED ORDER — SODIUM CHLORIDE 0.9 % IV BOLUS
500.0000 mL | Freq: Once | INTRAVENOUS | Status: AC
  Administered 2024-10-03: 500 mL via INTRAVENOUS

## 2024-10-03 MED ORDER — DIGOXIN 0.25 MG/ML IJ SOLN
0.2500 mg | Freq: Once | INTRAMUSCULAR | Status: AC
  Administered 2024-10-03: 0.25 mg via INTRAVENOUS
  Filled 2024-10-03: qty 2

## 2024-10-03 MED ORDER — POTASSIUM CHLORIDE 20 MEQ PO PACK
40.0000 meq | PACK | Freq: Once | ORAL | Status: AC
  Administered 2024-10-03: 40 meq via ORAL
  Filled 2024-10-03: qty 2

## 2024-10-03 MED ORDER — POTASSIUM CHLORIDE 10 MEQ/100ML IV SOLN
10.0000 meq | INTRAVENOUS | Status: AC
  Administered 2024-10-03 (×2): 10 meq via INTRAVENOUS
  Filled 2024-10-03 (×2): qty 100

## 2024-10-03 NOTE — Plan of Care (Signed)

## 2024-10-03 NOTE — Plan of Care (Signed)
   Problem: Education: Goal: Knowledge of General Education information will improve Description: Including pain rating scale, medication(s)/side effects and non-pharmacologic comfort measures Outcome: Progressing   Problem: Elimination: Goal: Will not experience complications related to bowel motility Outcome: Progressing   Problem: Pain Managment: Goal: General experience of comfort will improve and/or be controlled Outcome: Progressing   Problem: Safety: Goal: Ability to remain free from injury will improve Outcome: Progressing

## 2024-10-03 NOTE — Progress Notes (Signed)
 CC: SBO Subjective: Patient with tachycardia overnight.  She denies any symptoms from this.  She denies any chest pain or palpitations.  From abdominal perspective she says that she had a small bowel movement yesterday with a suppository.  She denies any nausea or vomiting.  She denies feeling bloated.  She tolerated a clear liquid diet without any increased nausea or vomiting.  Objective: Vital signs in last 24 hours: Temp:  [97.9 F (36.6 C)-99 F (37.2 C)] 98.3 F (36.8 C) (11/02 0330) Pulse Rate:  [62-125] 62 (11/02 0330) Resp:  [16-18] 17 (11/02 0330) BP: (107-165)/(50-100) 107/50 (11/02 0330) SpO2:  [95 %-99 %] 96 % (11/02 0330) Last BM Date : 10/02/24  Intake/Output from previous day: 11/01 0701 - 11/02 0700 In: 1185.4 [I.V.:935.7; IV Piggyback:249.7] Out: -  Intake/Output this shift: No intake/output data recorded.  Physical exam:  Abdomen is soft, nontender and nondistended  Lab Results: CBC  Recent Labs    10/01/24 0500 10/02/24 0536  WBC 12.1* 8.0  HGB 13.0 11.4*  HCT 39.5 35.3*  PLT 336 259   BMET Recent Labs    10/02/24 0536 10/03/24 0650  NA 139 139  K 3.6 4.5  CL 104 105  CO2 26 23  GLUCOSE 107* 89  BUN 14 10  CREATININE 1.08* 1.03*  CALCIUM 8.4* 8.5*   PT/INR No results for input(s): LABPROT, INR in the last 72 hours. ABG No results for input(s): PHART, HCO3 in the last 72 hours.  Invalid input(s): PCO2, PO2  Studies/Results: DG ABD ACUTE 2+V W 1V CHEST Result Date: 10/02/2024 CLINICAL DATA:  Small-bowel obstruction EXAM: DG ABDOMEN ACUTE WITH 1 VIEW CHEST COMPARISON:  CT abdomen and pelvis dated 10/01/2024 FINDINGS: Lines/tubes: Gastric/enteric tube tip projects over the stomach. Chest: Lungs are well inflated. No focal consolidations. Blunting of the bilateral costophrenic angles. No pneumothorax. Normal heart size. Abdomen: Mildly dilated right hemi abdominal small bowel. Moderate volume stool within the colon. No pneumatosis  or free air. No abnormal calcification or mass effect. Bones: No acute osseous abnormality. Partially imaged right hip arthroplasty hardware appears intact. IMPRESSION: 1. Mildly dilated right hemi abdominal small bowel, which may represent ileus or reported small bowel obstruction. 2. Moderate volume stool within the colon. 3. Blunting of the bilateral costophrenic angles, which may be secondary to well inflated lungs or represent trace pleural effusions. Electronically Signed   By: Limin  Xu M.D.   On: 10/02/2024 10:23   DG Abd Portable 1 View Result Date: 10/01/2024 EXAM: 1 VIEW XRAY OF THE ABDOMEN 10/01/2024 11:24:00 AM COMPARISON: None available. CLINICAL HISTORY: confirm NG placement confirm NG placement FINDINGS: LINES, TUBES AND DEVICES: Nasogastric tube tip is seen in proximal stomach. BOWEL: Small bowel dilatation is noted and left side of abdomen. SOFT TISSUES: No opaque urinary calculi. BONES: No acute osseous abnormality. IMPRESSION: 1. Small bowel dilatation in the left abdomen. 2. Nasogastric tube tip in the proximal stomach. Electronically signed by: Lynwood Seip MD 10/01/2024 12:01 PM EDT RP Workstation: [id number]    Anti-infectives: Anti-infectives (From admission, onward)    None       Assessment/Plan:  Patient with small bowel obstruction that seems to have resolved.  She tolerated a clear liquid diet last night.  I think we can advance her to a soft diet this morning.  If she tolerates this then likely can go home this afternoon from a surgical perspective.  Will defer to hospitalist team concerning any possible workup needed for the tachycardia.  I would also  recommend starting on a bowel regimen.  Jayson Endow, M.D. [facility]

## 2024-10-03 NOTE — TOC Initial Note (Signed)
 Transition of Care [facility]) - Initial/Assessment Note    Patient Details  Name: Angela Buck MRN: [id number] Date of Birth: 10-30-1941  [facility] CM/SW Contact:    Brayla Pat L Levelle Edelen, LCSW Phone Number: 10/03/2024, 10:54 AM  Clinical Narrative:                  [facility] consult received. PCP list added to AVS       Patient Goals and CMS Choice            Expected Discharge Plan and Services         Expected Discharge Date: 10/03/24                                    Prior Living Arrangements/Services                       Activities of Daily Living   ADL Screening (condition at time of admission) Independently performs ADLs?: Yes (appropriate for developmental age) Is the patient deaf or have difficulty hearing?: Yes Does the patient have difficulty seeing, even when wearing glasses/contacts?: No Does the patient have difficulty concentrating, remembering, or making decisions?: No  Permission Sought/Granted                  Emotional Assessment              Admission diagnosis:  Small bowel obstruction (HCC) [K56.609] SBO (small bowel obstruction) (HCC) [K56.609] Generalized abdominal pain [R10.84] Nausea and vomiting, unspecified vomiting type [R11.2] Patient Active Problem List   Diagnosis Date Noted   Pancreatic mass 10/02/2024   New onset atrial fibrillation (HCC) 10/02/2024   Small bowel obstruction (HCC) 10/01/2024   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   Chronic kidney disease, stage 3a (HCC) 06/13/2022   Insomnia 06/13/2022   Genital herpes 03/12/2022   Bilateral foot pain 03/12/2022   Feeling grief 01/12/2021   Hyperglycemia 04/20/2018   Plantar fasciitis 06/11/2017   Vitamin D  deficiency 04/07/2017   Neutropenia 10/07/2016   Constipation by delayed colonic transit 01/27/2015   Hyperlipidemia, mild 05/18/2014   Fatigue 03/16/2012   Thyroid  disease 03/16/2012   Colonic polyp 01/05/2012   Osteoporosis 01/05/2012    History of mumps    Skin cancer 12/02/2009   PCP:  Pcp, No Pharmacy:   [id number] [address], [place] - [address] [place] [address] Phone: [phone] Fax: [phone]     Social Drivers of Health (SDOH) Social History: SDOH Screenings   Food Insecurity: No Food Insecurity (10/01/2024)  Housing: Low Risk  (10/01/2024)  Transportation Needs: No Transportation Needs (10/01/2024)  Utilities: Not At Risk (10/01/2024)  Alcohol Screen: Low Risk  (06/21/2024)  Depression (PHQ2-9): Low Risk  (06/25/2024)  Financial Resource Strain: Low Risk  (06/21/2024)  Physical Activity: Sufficiently Active (06/21/2024)  Social Connections: Moderately Isolated (10/01/2024)  Stress: Stress Concern Present (06/21/2024)  Tobacco Use: Low Risk  (10/01/2024)   SDOH Interventions:     Readmission Risk Interventions     No data to display

## 2024-10-03 NOTE — Discharge Summary (Signed)
 Physician Discharge Summary   Patient: Angela Buck MRN: [id number] DOB: [birth year]  Admit date:     10/01/2024  Discharge date: 10/03/24  Discharge Physician: Murvin Mana   PCP: Pcp, No   Recommendations at discharge:   Follow-up with PCP in 1 week, TOC to arrange. Follow-up with GI in 2 weeks. Follow-up with cardiology in 1 week.  Discharge Diagnoses: Principal Problem:   Small bowel obstruction (HCC) Active Problems:   Chronic kidney disease, stage 3a (HCC)   Pancreatic mass   New onset atrial fibrillation (HCC)  Resolved Problems:   * No resolved hospital problems. *  Hospital Course: Angela Buck is a 83 y.o. female with medical history significant of hypothyroidism, HLD, SBO presented with worsening of nauseous vomiting abdominal pain.  Symptoms started 3 days ago.  CT scan showed small bowel obstruction, with transition point in the right upper quadrant. Patient is seen by general surgery, NG suction was started on admission. Condition improving, patient has passed gas.  General surgery has clamped NGT tube in the morning of 11/1.  Patient condition continued to improve, had normal bowel movement, tolerating diet.  Medically stable for discharge  Assessment and Plan: Small bowel obstruction.  Most likely secondary to adhesion. Patient had a laparoscopy 20 years ago. Last colonoscopy was 5 years ago with removal of 2 polyps. Received IV fluids and symptomatic treatment. X-ray and CT scan also showed a moderate stool burden in the colon, received Dulcolax suppository. Condition has improved, medically stable for discharge.   Chronic kidney disease stage 3a. Patient has slightly worsening renal function, but does not meet criteria for AKI.   Pancreatic mass. Incidental finding, CT scan reporting suspect cystic lesion. Advised patient to follow-up with PCP and GI for a none emergent MRI   New onset atrial fibrillation. Paroxysmal atrial fibrillation with  RVR. Patient developed atrial fibrillation 11/1, heart rate was 100-110, she was given beta-blocker.  She was totally asymptomatic.  It was converted to sinus spontaneously.  At this point, patient is medically stable for discharge.  I would not perform any additional study, but refer her to see Dr. Gollan in the office.          Consultants: General surgery. Procedures performed: None Disposition: Home Diet recommendation:  Discharge Diet Orders (From admission, onward)     Start     Ordered   10/03/24 0000  Diet - low sodium heart healthy        10/03/24 0947           Cardiac diet DISCHARGE MEDICATION: Allergies as of 10/03/2024       Reactions   Bee Venom Anaphylaxis   Penicillamine Other (See Comments)   Penicillins Hives   Has patient had a PCN reaction causing immediate rash, facial/tongue/throat swelling, SOB or lightheadedness with hypotension: No Has patient had a PCN reaction causing severe rash involving mucus membranes or skin necrosis: No Has patient had a PCN reaction that required hospitalization No Has patient had a PCN reaction occurring within the last 10 years: No If all of the above answers are NO, then may proceed with Cephalosporin use.   Shellfish Allergy         Medication List     STOP taking these medications    OVER THE COUNTER MEDICATION       TAKE these medications    COLON CARE PO Take 1 capsule by mouth at bedtime. Colon Max   latanoprost 0.005 % ophthalmic solution Commonly  known as: XALATAN Place 1 drop into both eyes at bedtime.   levothyroxine  75 MCG tablet Commonly known as: SYNTHROID  Take 1 tablet (75 mcg total) by mouth daily.   timolol 0.5 % ophthalmic solution Commonly known as: TIMOPTIC Place 1 drop into both eyes daily.   vitamin C 100 MG tablet Take 500 mg by mouth daily.   VITAMIN D2 + K1 PO Take 1 capsule by mouth daily.        Follow-up Information     Therisa Bi, MD Follow up in 2  week(s).   Specialty: Gastroenterology Contact information: [address] [place] [address] [phone]         Perla Evalene PARAS, MD Follow up in 1 week(s).   Specialty: Cardiology Contact information: [address] [place] [address] [place] [address] [phone]                Discharge Exam: Fredricka Weights   10/01/24 0452  Weight: 66.8 kg   General exam: Appears calm and comfortable  Respiratory system: Clear to auscultation. Respiratory effort normal. Cardiovascular system: S1 & S2 heard, RRR. No JVD, murmurs, rubs, gallops or clicks. No pedal edema. Gastrointestinal system: Abdomen is nondistended, soft and nontender. No organomegaly or masses felt. Normal bowel sounds heard. Central nervous system: Alert and oriented. No focal neurological deficits. Extremities: Symmetric 5 x 5 power. Skin: No rashes, lesions or ulcers Psychiatry: Judgement and insight appear normal. Mood & affect appropriate.    Condition at discharge: good  The results of significant diagnostics from this hospitalization (including imaging, microbiology, ancillary and laboratory) are listed below for reference.   Imaging Studies: DG ABD ACUTE 2+V W 1V CHEST Result Date: 10/02/2024 CLINICAL DATA:  Small-bowel obstruction EXAM: DG ABDOMEN ACUTE WITH 1 VIEW CHEST COMPARISON:  CT abdomen and pelvis dated 10/01/2024 FINDINGS: Lines/tubes: Gastric/enteric tube tip projects over the stomach. Chest: Lungs are well inflated. No focal consolidations. Blunting of the bilateral costophrenic angles. No pneumothorax. Normal heart size. Abdomen: Mildly dilated right hemi abdominal small bowel. Moderate volume stool within the colon. No pneumatosis or free air. No abnormal calcification or mass effect. Bones: No acute osseous abnormality. Partially imaged right hip arthroplasty hardware appears intact. IMPRESSION: 1. Mildly dilated right hemi abdominal small bowel, which may represent ileus or  reported small bowel obstruction. 2. Moderate volume stool within the colon. 3. Blunting of the bilateral costophrenic angles, which may be secondary to well inflated lungs or represent trace pleural effusions. Electronically Signed   By: Limin  Xu M.D.   On: 10/02/2024 10:23   DG Abd Portable 1 View Result Date: 10/01/2024 EXAM: 1 VIEW XRAY OF THE ABDOMEN 10/01/2024 11:24:00 AM COMPARISON: None available. CLINICAL HISTORY: confirm NG placement confirm NG placement FINDINGS: LINES, TUBES AND DEVICES: Nasogastric tube tip is seen in proximal stomach. BOWEL: Small bowel dilatation is noted and left side of abdomen. SOFT TISSUES: No opaque urinary calculi. BONES: No acute osseous abnormality. IMPRESSION: 1. Small bowel dilatation in the left abdomen. 2. Nasogastric tube tip in the proximal stomach. Electronically signed by: Lynwood Seip MD 10/01/2024 12:01 PM EDT RP Workstation: [id number]   CT ABDOMEN PELVIS W CONTRAST Result Date: 10/01/2024 EXAM: CT ABDOMEN AND PELVIS WITH CONTRAST 10/01/2024 06:19:20 AM TECHNIQUE: CT of the abdomen and pelvis was performed with the administration of 100 mL of iohexol  (OMNIPAQUE ) 300 MG/ML solution. Multiplanar reformatted images are provided for review. Automated exposure control, iterative reconstruction, and/or weight-based adjustment of the mA/kV was utilized to reduce  the radiation dose to as low as reasonably achievable. COMPARISON: CT with contrast 06/13/2021. CLINICAL HISTORY: FINDINGS: LOWER CHEST: Small hiatal hernia. Scattered linear scar-like opacities in both lung bases without infiltrates. The cardiac size is normal. There are multiple eventrations along the right hemidiaphragm. LIVER: The liver is unremarkable. There is a small amount of perihepatic ascites. GALLBLADDER AND BILE DUCTS: Gallbladder is unremarkable. No biliary ductal dilatation. SPLEEN: No acute abnormality. There is a small amount of perisplenic ascites. PANCREAS: In the inferior body of the  pancreas in the midline, there is a 1.1 x 0.7 x 1 cm low density lesion measuring 17.5 Hounsfield units. This was seen previously and probably either a cyst or a main duct IPMN. This has never been characterized by MRI. Nonemergent follow-up MRI without and with contrast is recommended. Remainder of the pancreas is unremarkable. ADRENAL GLANDS: No acute abnormality. KIDNEYS, URETERS AND BLADDER: No stones in the kidneys or ureters. No hydronephrosis. No perinephric or periureteral stranding. The bladder is obscured by spray artifacts from right hip replacement and unable to be evaluated. GI AND BOWEL: There is fluid filling in the stomach and backup or reflux into the distal esophagus. The junction of the duodenum and jejunum is in the right mid abdomen in this patient consistent with a midgut malrotation. Beginning in the jejunum there is diffuse dilatation of the small bowel up to 3.5 cm down to the subcecal right lower quadrant, where there are feces-filled segments and a transition from dilated to decompressed caliber on axial image 51 of series 2. There is a sharply angulated segment in this location with the most likely obstructive etiology being adhesive disease. The downstream terminal ileal segments are collapsed consistent with a high-grade obstruction. There is no bowel pneumatosis or free air. There are some mild mesenteric congestive changes in the pelvis. PERITONEUM AND RETROPERITONEUM: There is a small amount of perisplenic and perihepatic ascites and scattered interloop ascites between dilated small bowel in the abdomen, with minimal ascites in the deep pelvis. No free air. VASCULATURE: Aorta is normal in caliber. There is patchy aortoiliac calcific plaque without aneurysm or dissection. LYMPH NODES: No lymphadenopathy. REPRODUCTIVE ORGANS: The uterus is surgically absent. No adnexal mass is seen. BONES AND SOFT TISSUES: Osteopenia. Right hip arthroplasty. Mild degenerative change lumbar spine. No  acute osseous abnormality. No focal soft tissue abnormality. IMPRESSION: 1. High-grade small bowel obstruction with transition in the subcecal right lower quadrant, likely adhesive etiology. No bowel pneumatosis or free air. Associated mild mesenteric congestion and small-volume ascites. 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 3. Indeterminate 1.1 x 0.7 x 1.0 cm low-attenuation lesion in the inferior pancreatic body, stable from prior. 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 Recommend nonemergent MRI with and without contrast for characterization. 4. Aortic atherosclerosis. Electronically signed by: Francis Quam MD 10/01/2024 07:33 AM EDT RP Workstation: [id number]    Microbiology: No results found for this or any previous visit.  Labs: CBC: Recent Labs  Lab 10/01/24 0500 10/02/24 0536  WBC 12.1* 8.0  NEUTROABS 9.8*  --   HGB 13.0 11.4*  HCT 39.5 35.3*  MCV 90.2 92.9  PLT 336 259   Basic Metabolic Panel: Recent Labs  Lab 10/01/24 0500 10/02/24 0536 10/03/24 0650  NA 142 139 139  K 4.0 3.6 4.5  CL 99 104 105  CO2 25 26 23   GLUCOSE 204* 107* 89  BUN 21 14 10   CREATININE 1.12* 1.08* 1.03*  CALCIUM  9.9 8.4* 8.5*  MG  --  2.2 1.8   Liver Function Tests: Recent Labs  Lab 10/01/24 0500  AST 31  ALT 15  ALKPHOS 75  BILITOT 0.9  PROT 7.4  ALBUMIN 3.7   CBG: No results for input(s): GLUCAP in the last 168 hours.  Discharge time spent: 35 minutes.  Signed: Murvin Mana, MD [facility] 10/03/2024

## 2024-10-04 LAB — CANCER ANTIGEN 27.29: CA 27.29: 11.9 U/mL (ref 0.0–38.6)

## 2024-10-05 ENCOUNTER — Ambulatory Visit: Payer: Self-pay | Admitting: Nurse Practitioner

## 2024-10-06 ENCOUNTER — Non-Acute Institutional Stay: Admitting: Orthopedic Surgery

## 2024-10-06 ENCOUNTER — Encounter: Payer: Self-pay | Admitting: Orthopedic Surgery

## 2024-10-06 VITALS — BP 118/68 | HR 69 | Temp 96.9°F | Ht 64.0 in | Wt 146.0 lb

## 2024-10-06 DIAGNOSIS — K862 Cyst of pancreas: Secondary | ICD-10-CM | POA: Diagnosis not present

## 2024-10-06 DIAGNOSIS — K56609 Unspecified intestinal obstruction, unspecified as to partial versus complete obstruction: Secondary | ICD-10-CM | POA: Diagnosis not present

## 2024-10-06 DIAGNOSIS — M810 Age-related osteoporosis without current pathological fracture: Secondary | ICD-10-CM | POA: Diagnosis not present

## 2024-10-06 DIAGNOSIS — I4891 Unspecified atrial fibrillation: Secondary | ICD-10-CM

## 2024-10-06 NOTE — Patient Instructions (Addendum)
 Recommend Caltrate for bone health   Ask her about MRI regarding pancreatic cyst  Recommend Miralax if you have not had a bowel movement in 2 days

## 2024-10-06 NOTE — Progress Notes (Unsigned)
 Location:  Other Nursing Home Room Number: [facility] Place of Service:  Clinic (12) Provider:  Greig FORBES Cluster, NP   Pcp, No  Patient Care Team: Pcp, No as PCP - General Domenica Harlene LABOR, MD (Family Medicine) Robinson Pao, MD as Consulting Physician (Dermatology) Vernell Kindle as Referring Physician (Ophthalmology)  Extended Emergency Contact Information Primary Emergency Contact: Telicia, [place] Mobile Phone: [phone] Relation: Sister Interpreter needed? No Secondary Emergency Contact: Coward,Croom Mobile Phone: [phone] Relation: Friend  Code Status:   Goals of care: Advanced Directive information    10/01/2024   11:54 AM  Advanced Directives  Does Patient Have a Medical Advance Directive? Yes  Type of Advance Directive Healthcare Power of Attorney  Does patient want to make changes to medical advance directive? No - Patient declined     Chief Complaint  Patient presents with   Hospitalization Follow-up    Hospital Follow up for Small Bowel Obstruction. D/C 10/03/2024. Wants to discuss Bone Density Results.     HPI:  Pt is a 83 y.o. female seen today for medical management of chronic diseases.     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   Leukopenia 05/18/2014   Mass of axilla 01/05/2012   Measles as a child   Medicare annual wellness visit, subsequent 01/05/2012   Sees Dr Kristie of Gastroenterology    Neutropenia 10/07/2016   Osteopenia 01/05/2012   Other and unspecified hyperlipidemia 05/18/2014   Pain of left heel 04/07/2017   Preventative health care 01/05/2012   Preventative health care 10/07/2016   Skin cancer 12/02/2009   Thyroid  disease 03/16/2012   right    Vitamin D  deficiency 04/07/2017   Past Surgical History:  Procedure  Laterality Date   biopsy of jawline  2011   CATARACT EXTRACTION, BILATERAL     b/l   laproscopy  1970   abdominal, endometriosis, with D&C   right hip replacement  2003   TONSILLECTOMY AND ADENOIDECTOMY  1949    Allergies  Allergen Reactions   Bee Venom Anaphylaxis   Penicillamine Other (See Comments)   Penicillins Hives    Has patient had a PCN reaction causing immediate rash, facial/tongue/throat swelling, SOB or lightheadedness with hypotension: No Has patient had a PCN reaction causing severe rash involving mucus membranes or skin necrosis: No Has patient had a PCN reaction that required hospitalization No Has patient had a PCN reaction occurring within the last 10 years: No If all of the above answers are NO, then may proceed with Cephalosporin use.   Shellfish Allergy     Outpatient Encounter Medications as of 10/06/2024  Medication Sig   Ascorbic Acid (VITAMIN C) 100 MG tablet Take 500 mg by mouth daily.   latanoprost (XALATAN) 0.005 % ophthalmic solution Place 1 drop into both eyes at bedtime.   levothyroxine  (SYNTHROID ) 75 MCG tablet Take 1 tablet (75 mcg total) by mouth daily.   Misc Natural Products (COLON CARE PO) Take 1 capsule by mouth at bedtime. Colon Max   Red Yeast Rice 600 MG CAPS Take 1 capsule by mouth daily.   timolol (TIMOPTIC) 0.5 % ophthalmic solution Place 1 drop into both eyes daily.   Vitamin D -Vitamin K (VITAMIN D2 + K1 PO)  Take 1 capsule by mouth daily.   No facility-administered encounter medications on file as of 10/06/2024.    Review of Systems  Immunization History  Administered Date(s) Administered   Fluad Quad(high Dose 65+) 10/14/2022   Influenza-Unspecified 10/14/2022   Moderna Sars-Covid-2 Vaccination 12/20/2019, 01/17/2020, 10/13/2020, 04/19/2021   Pfizer Covid-19 Vaccine Bivalent Booster 106yrs & up 08/23/2021, 04/30/2022   Tdap 07/24/2021   Pertinent  Health Maintenance Due  Topic Date Due   Influenza Vaccine  07/02/2024   DEXA  SCAN  Completed   Mammogram  Discontinued   Colonoscopy  Discontinued      07/01/2023   10:36 AM 10/07/2023    8:42 AM 06/10/2024    9:55 AM 06/25/2024    9:21 AM 10/06/2024    2:26 PM  Fall Risk  Falls in the past year? 1 1 0 0 0  Was there an injury with Fall? 1 1 0 0 0  Fall Risk Category Calculator 2 3 0 0 0  Patient at Risk for Falls Due to   No Fall Risks No Fall Risks No Fall Risks  Fall risk Follow up   Falls evaluation completed Falls evaluation completed Falls evaluation completed   Functional Status Survey:    Vitals:   10/06/24 1420  BP: 118/68  Pulse: 69  Temp: (!) 96.9 F (36.1 C)  SpO2: 96%  Weight: 146 lb (66.2 kg)  Height: 5' 4 (1.626 m)   Body mass index is 25.06 kg/m. Physical Exam  Labs reviewed: Recent Labs    06/28/24 0808 10/01/24 0500 10/02/24 0536 10/03/24 0650  NA 138 142 139 139  K 4.2 4.0 3.6 4.5  CL 104 99 104 105  CO2 28 25 26 23   GLUCOSE 78 204* 107* 89  BUN 21 21 14 10   CREATININE 0.92 1.12* 1.08* 1.03*  CALCIUM 9.2  9.2 9.9 8.4* 8.5*  MG  --   --  2.2 1.8  PHOS 3.5  --   --   --    Recent Labs    12/18/23 0735 06/28/24 0808 10/01/24 0500  AST 22 18 31   ALT 13 19 15   ALKPHOS  --   --  75  BILITOT 0.5 0.5 0.9  PROT 6.3 6.2 7.4  ALBUMIN  --   --  3.7   Recent Labs    12/18/23 0735 10/01/24 0500 10/02/24 0536  WBC 4.2 12.1* 8.0  NEUTROABS 2,155 9.8*  --   HGB 12.3 13.0 11.4*  HCT 38.9 39.5 35.3*  MCV 94.2 90.2 92.9  PLT 312 336 259   Lab Results  Component Value Date   TSH 0.61 06/28/2024   Lab Results  Component Value Date   HGBA1C 5.8 (H) 06/28/2024   Lab Results  Component Value Date   CHOL 211 (H) 06/28/2024   HDL 61 06/28/2024   LDLCALC 131 (H) 06/28/2024   TRIG 93 06/28/2024   CHOLHDL 3.5 06/28/2024    Significant Diagnostic Results in last 30 days:  DG ABD ACUTE 2+V W 1V CHEST Result Date: 10/02/2024 CLINICAL DATA:  Small-bowel obstruction EXAM: DG ABDOMEN ACUTE WITH 1 VIEW CHEST  COMPARISON:  CT abdomen and pelvis dated 10/01/2024 FINDINGS: Lines/tubes: Gastric/enteric tube tip projects over the stomach. Chest: Lungs are well inflated. No focal consolidations. Blunting of the bilateral costophrenic angles. No pneumothorax. Normal heart size. Abdomen: Mildly dilated right hemi abdominal small bowel. Moderate volume stool within the colon. No pneumatosis or free air. No abnormal calcification or mass effect. Bones: No  acute osseous abnormality. Partially imaged right hip arthroplasty hardware appears intact. IMPRESSION: 1. Mildly dilated right hemi abdominal small bowel, which may represent ileus or reported small bowel obstruction. 2. Moderate volume stool within the colon. 3. Blunting of the bilateral costophrenic angles, which may be secondary to well inflated lungs or represent trace pleural effusions. Electronically Signed   By: Limin  Xu M.D.   On: 10/02/2024 10:23   DG Abd Portable 1 View Result Date: 10/01/2024 EXAM: 1 VIEW XRAY OF THE ABDOMEN 10/01/2024 11:24:00 AM COMPARISON: None available. CLINICAL HISTORY: confirm NG placement confirm NG placement FINDINGS: LINES, TUBES AND DEVICES: Nasogastric tube tip is seen in proximal stomach. BOWEL: Small bowel dilatation is noted and left side of abdomen. SOFT TISSUES: No opaque urinary calculi. BONES: No acute osseous abnormality. IMPRESSION: 1. Small bowel dilatation in the left abdomen. 2. Nasogastric tube tip in the proximal stomach. Electronically signed by: Lynwood Seip MD 10/01/2024 12:01 PM EDT RP Workstation: [id number]   CT ABDOMEN PELVIS W CONTRAST Result Date: 10/01/2024 EXAM: CT ABDOMEN AND PELVIS WITH CONTRAST 10/01/2024 06:19:20 AM TECHNIQUE: CT of the abdomen and pelvis was performed with the administration of 100 mL of iohexol  (OMNIPAQUE ) 300 MG/ML solution. Multiplanar reformatted images are provided for review. Automated exposure control, iterative reconstruction, and/or weight-based adjustment of the mA/kV was  utilized to reduce the radiation dose to as low as reasonably achievable. COMPARISON: CT with contrast 06/13/2021. CLINICAL HISTORY: FINDINGS: LOWER CHEST: Small hiatal hernia. Scattered linear scar-like opacities in both lung bases without infiltrates. The cardiac size is normal. There are multiple eventrations along the right hemidiaphragm. LIVER: The liver is unremarkable. There is a small amount of perihepatic ascites. GALLBLADDER AND BILE DUCTS: Gallbladder is unremarkable. No biliary ductal dilatation. SPLEEN: No acute abnormality. There is a small amount of perisplenic ascites. PANCREAS: In the inferior body of the pancreas in the midline, there is a 1.1 x 0.7 x 1 cm low density lesion measuring 17.5 Hounsfield units. This was seen previously and probably either a cyst or a main duct IPMN. This has never been characterized by MRI. Nonemergent follow-up MRI without and with contrast is recommended. Remainder of the pancreas is unremarkable. ADRENAL GLANDS: No acute abnormality. KIDNEYS, URETERS AND BLADDER: No stones in the kidneys or ureters. No hydronephrosis. No perinephric or periureteral stranding. The bladder is obscured by spray artifacts from right hip replacement and unable to be evaluated. GI AND BOWEL: There is fluid filling in the stomach and backup or reflux into the distal esophagus. The junction of the duodenum and jejunum is in the right mid abdomen in this patient consistent with a midgut malrotation. Beginning in the jejunum there is diffuse dilatation of the small bowel up to 3.5 cm down to the subcecal right lower quadrant, where there are feces-filled segments and a transition from dilated to decompressed caliber on axial image 51 of series 2. There is a sharply angulated segment in this location with the most likely obstructive etiology being adhesive disease. The downstream terminal ileal segments are collapsed consistent with a high-grade obstruction. There is no bowel pneumatosis or  free air. There are some mild mesenteric congestive changes in the pelvis. PERITONEUM AND RETROPERITONEUM: There is a small amount of perisplenic and perihepatic ascites and scattered interloop ascites between dilated small bowel in the abdomen, with minimal ascites in the deep pelvis. No free air. VASCULATURE: Aorta is normal in caliber. There is patchy aortoiliac calcific plaque without aneurysm or dissection. LYMPH NODES: No lymphadenopathy. REPRODUCTIVE ORGANS:  The uterus is surgically absent. No adnexal mass is seen. BONES AND SOFT TISSUES: Osteopenia. Right hip arthroplasty. Mild degenerative change lumbar spine. No acute osseous abnormality. No focal soft tissue abnormality. IMPRESSION: 1. High-grade small bowel obstruction with transition in the subcecal right lower quadrant, likely adhesive etiology. No bowel pneumatosis or free air. Associated mild mesenteric congestion and small-volume ascites. 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 3. Indeterminate 1.1 x 0.7 x 1.0 cm low-attenuation lesion in the inferior pancreatic body, stable from prior. 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 Recommend nonemergent MRI with and without contrast for characterization. 4. Aortic atherosclerosis. Electronically signed by: Francis Quam MD 10/01/2024 07:33 AM EDT RP Workstation: [id number]    Assessment/Plan 1. Small bowel obstruction (HCC) (Primary) ***    Family/ staff Communication: ***  Labs/tests ordered:  ***

## 2024-10-07 ENCOUNTER — Other Ambulatory Visit (HOSPITAL_COMMUNITY): Payer: Self-pay | Admitting: Gastroenterology

## 2024-10-07 DIAGNOSIS — K869 Disease of pancreas, unspecified: Secondary | ICD-10-CM

## 2024-10-07 DIAGNOSIS — K56609 Unspecified intestinal obstruction, unspecified as to partial versus complete obstruction: Secondary | ICD-10-CM | POA: Diagnosis not present

## 2024-10-07 DIAGNOSIS — R935 Abnormal findings on diagnostic imaging of other abdominal regions, including retroperitoneum: Secondary | ICD-10-CM

## 2024-10-07 DIAGNOSIS — K5900 Constipation, unspecified: Secondary | ICD-10-CM | POA: Diagnosis not present

## 2024-10-07 DIAGNOSIS — R933 Abnormal findings on diagnostic imaging of other parts of digestive tract: Secondary | ICD-10-CM | POA: Diagnosis not present

## 2024-10-07 NOTE — Progress Notes (Unsigned)
 New Patient Evaluation and Consultation  Referring Provider: Abdul Fine, MD PCP: Laurence Locus, DO Date of Service: 10/08/2024  SUBJECTIVE Chief Complaint: No chief complaint on file.  History of Present Illness: Angela Buck is a 83 y.o. {ED SANE [phone] female seen in consultation at the request of Dr Abdul for evaluation of stress urinary incontinence.    Admitted for SBO attributed to adhesions 10/31-11/2/25 due to N/V, abdominal pain treated by NG decompression. Discharged to nursing home.   ***Review of records significant for: ***Stage 3a CKD, constipation, HSV, A fib, h/o SBO, pancreatic mass  Urinary Symptoms: {urine leakage?:24754} Leaks *** time(s) per {days/wks/mos/yrs:310907}.  Pad use: {NUMBERS 1-10:18281} {pad option:24752} per day.   Patient {ACTION; IS/IS WNU:78978602} bothered by UI symptoms.  Day time voids ***.  Nocturia: *** times per night to void with insomnia Voiding dysfunction:  {empties:24755} bladder well.  Patient {DOES NOT does:27190::does not} use a catheter to empty bladder.  When urinating, patient feels {urine symptoms:24756} Drinks: *** per day  UTIs: {NUMBERS 1-10:18281} UTI's in the last year.   {ACTIONS;DENIES/REPORTS:21021675::Denies} history of {urologic concerns:24757} No results found for the last 90 days.   Pelvic Organ Prolapse Symptoms:                  Patient {denies/ admits to:24761} a feeling of a bulge the vaginal area. It has been present for {NUMBER 1-10:22536} {days/wks/mos/yrs:310907}.  Patient {denies/ admits to:24761} seeing a bulge.  This bulge {ACTION; IS/IS WNU:78978602} bothersome.  Bowel Symptom: Bowel movements: *** time(s) per {Time; day/week/month:13537} Stool consistency: {stool consistency:24758} Straining: {yes/no:19897}.  Splinting: {yes/no:19897}.  Incomplete evacuation: {yes/no:19897}.  Patient {denies/ admits to:24761} accidental bowel leakage / fecal incontinence  Occurs: *** time(s)  per {Time; day/week/month:13537}  Consistency with leakage: {stool consistency:24758} Bowel regimen: {bowel regimen:24759} Last colonoscopy: Date ***, Results *** [facility] Colonoscopy          Completed or No Longer Recommended     Colonoscopy  Discontinued    12/05/2010  [facility] COLONOSCOPY   Only the first 1 history entries have been loaded, but more history exists.                Sexual Function Sexually active: {yes/no:19897}.  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   Leukopenia 05/18/2014   Mass of axilla 01/05/2012   Measles as a child   Medicare annual wellness visit, subsequent 01/05/2012   Sees Dr Kristie of Gastroenterology    Neutropenia 10/07/2016   Osteopenia 01/05/2012   Other and unspecified hyperlipidemia 05/18/2014   Pain of left heel 04/07/2017   Preventative health care 01/05/2012   Preventative health care 10/07/2016   Skin cancer 12/02/2009   Thyroid  disease 03/16/2012   right    Vitamin D  deficiency 04/07/2017     Past Surgical History:   Past Surgical History:  Procedure Laterality Date   biopsy of jawline  2011   CATARACT EXTRACTION, BILATERAL     b/l   laproscopy  1970   abdominal, endometriosis,  with D&C   right hip replacement  2003   TONSILLECTOMY AND ADENOIDECTOMY  1949     Past OB/GYN History: OB History  Gravida Para Term Preterm AB Living  1    1   SAB IAB Ectopic Multiple Live Births  1        # Outcome Date GA Lbr Len/2nd Weight Sex Type Anes PTL Lv  1 SAB              Vaginal deliveries: ***,  Forceps/ Vacuum deliveries: ***, Cesarean section: *** Menopausal: {menopausal:24763} Contraception: ***. Last pap smear was ***.  Any history of abnormal pap smears: {yes/no:19897}. No results found for: DIAGPAP, HPVHIGH, ADEQPAP  Medications: Patient has a current medication list which includes the following prescription(s): vitamin c, latanoprost, levothyroxine , misc natural products, red yeast rice, timolol, and vitamin d -vitamin k.   Allergies: Patient is allergic to bee venom, penicillamine, penicillins, and shellfish allergy.   Social History:  Social History   Tobacco Use   Smoking status: Never   Smokeless tobacco: Never  Vaping Use   Vaping status: Never Used  Substance Use Topics   Alcohol use: Yes    Comment: occasional wine   Drug use: No    Relationship status: {relationship status:24764} Patient lives with ***.   Patient {ACTION; IS/IS WNU:78978602} employed ***. Regular exercise: {Yes/No:304960894} History of abuse: {Yes/No:304960894}  Family History:   Family History  Problem Relation Age of Onset   Arthritis Mother    Hypertension Mother    Dementia Mother    Heart attack Mother    Glaucoma Mother    Heart disease Mother        MI, stent in 2004   Emphysema Father        smoker   Hypertension Father    Glaucoma Father    COPD Father    Osteoporosis Sister        Per [facility]  New Patient packet   Ulcers Maternal Grandmother    Other Paternal Grandfather        enlarged heart     Review of Systems: ROS   OBJECTIVE Physical Exam: There were no vitals filed for this visit.  Physical Exam   GU / Detailed Urogynecologic Evaluation:  Pelvic Exam: Normal external female genitalia; Bartholin's and Skene's glands normal in appearance; urethral meatus normal in appearance, no urethral masses or discharge.   CST: {gen negative/positive:315881}  Reflexes: bulbocavernosis {DESC; PRESENT/NOT PRESENT:21021351},  anocutaneous {DESC; PRESENT/NOT PRESENT:21021351} ***bilaterally.  Speculum exam reveals normal vaginal mucosa {With/Without:20273} atrophy. Cervix {exam; gyn cervix:30847}. Uterus {exam; pelvic uterus:30849}. Adnexa {exam; adnexa:12223}.    s/p hysterectomy: Speculum exam reveals normal vaginal mucosa {With/Without:20273}  atrophy and normal vaginal cuff.  Adnexa {exam; adnexa:12223}.    With apex supported, anterior compartment defect was {reduced:24765}  Pelvic floor strength {Roman # I-V:19040}/V, puborectalis {Roman # I-V:19040}/V external anal sphincter {Roman # I-V:19040}/V  Pelvic floor musculature: Right levator {Tender/Non-tender:20250}, Right obturator {Tender/Non-tender:20250}, Left levator {Tender/Non-tender:20250}, Left obturator {Tender/Non-tender:20250}  POP-Q:   POP-Q                                               Aa  Ba                                                 C                                                Gh                                               Pb                                               tvl                                                Ap                                               Bp                                                 D      Rectal Exam:  Normal sphincter tone, {rectocele:24766} distal rectocele, enterocoele {DESC; PRESENT/NOT PRESENT:21021351}, no rectal masses, {sign of:24767} dyssynergia when asking the patient to bear down.  Post-Void Residual (PVR) by Bladder Scan: In order to evaluate bladder emptying, we discussed obtaining a postvoid residual and patient agreed to this procedure.  Procedure: The ultrasound unit was placed on the patient's abdomen in the suprapubic region after the patient had voided.      Laboratory Results: Lab Results  Component Value Date   BILIRUBINUR NEGATIVE 06/12/2021   PROTEINUR NEGATIVE 06/12/2021   LEUKOCYTESUR SMALL (A)  06/12/2021    Lab Results  Component Value Date   CREATININE 1.03 (H) 10/03/2024   CREATININE 1.08 (H) 10/02/2024   CREATININE 1.12 (H) 10/01/2024    Lab Results  Component Value Date   HGBA1C 5.8 (H) 06/28/2024    Lab Results  Component Value Date   HGB 11.4 (L) 10/02/2024     ASSESSMENT AND PLAN Angela Buck is a 83 y.o. with: No diagnosis found.  There are no diagnoses linked to this encounter.   Angela ONEIDA Gillis, MD

## 2024-10-08 ENCOUNTER — Ambulatory Visit: Admitting: Obstetrics

## 2024-10-08 ENCOUNTER — Other Ambulatory Visit (HOSPITAL_COMMUNITY)
Admission: RE | Admit: 2024-10-08 | Discharge: 2024-10-08 | Disposition: A | Discharge status: Home / Self Care | Source: Other Acute Inpatient Hospital | Attending: Obstetrics | Admitting: Obstetrics

## 2024-10-08 ENCOUNTER — Encounter: Payer: Self-pay | Admitting: Obstetrics

## 2024-10-08 VITALS — BP 135/67 | HR 58 | Ht 62.8 in | Wt 145.2 lb

## 2024-10-08 DIAGNOSIS — R351 Nocturia: Secondary | ICD-10-CM

## 2024-10-08 DIAGNOSIS — N952 Postmenopausal atrophic vaginitis: Secondary | ICD-10-CM

## 2024-10-08 DIAGNOSIS — N393 Stress incontinence (female) (male): Secondary | ICD-10-CM | POA: Diagnosis not present

## 2024-10-08 DIAGNOSIS — R8290 Unspecified abnormal findings in urine: Secondary | ICD-10-CM | POA: Insufficient documentation

## 2024-10-08 DIAGNOSIS — K5901 Slow transit constipation: Secondary | ICD-10-CM | POA: Diagnosis not present

## 2024-10-08 DIAGNOSIS — R319 Hematuria, unspecified: Secondary | ICD-10-CM | POA: Insufficient documentation

## 2024-10-08 DIAGNOSIS — M6289 Other specified disorders of muscle: Secondary | ICD-10-CM | POA: Diagnosis not present

## 2024-10-08 LAB — URINALYSIS, COMPLETE (UACMP) WITH MICROSCOPIC
Bacteria, UA: NONE SEEN
Bilirubin Urine: NEGATIVE
Glucose, UA: NEGATIVE mg/dL
Hgb urine dipstick: NEGATIVE
Ketones, ur: NEGATIVE mg/dL
Nitrite: NEGATIVE
Protein, ur: NEGATIVE mg/dL
Specific Gravity, Urine: 1.01 (ref 1.005–1.030)
pH: 6 (ref 5.0–8.0)

## 2024-10-08 LAB — URINALYSIS, ROUTINE W REFLEX MICROSCOPIC
Bacteria, UA: NONE SEEN
Bilirubin Urine: NEGATIVE
Glucose, UA: NEGATIVE mg/dL
Hgb urine dipstick: NEGATIVE
Ketones, ur: NEGATIVE mg/dL
Nitrite: NEGATIVE
Protein, ur: NEGATIVE mg/dL
Specific Gravity, Urine: 1.01 (ref 1.005–1.030)
pH: 5 (ref 5.0–8.0)

## 2024-10-08 NOTE — Assessment & Plan Note (Addendum)
-   prior use of colonics and taking colon max (mag ox) 1 tab/night for 10 years - recent hospitalization from 10/31-11/2/25 for SBO requiring NG tube placement - For constipation, we reviewed the importance of a better bowel regimen.  We also discussed the importance of avoiding chronic straining, as it can exacerbate her pelvic floor symptoms; we discussed treating constipation and straining prior to surgery, as postoperative straining can lead to damage to the repair and recurrence of symptoms. We discussed initiating therapy with increasing fluid intake, fiber supplementation, stool softeners, and laxatives such as miralax. - reviewed titration of fiber supplementation to optimize stool consistency - encouraged to consider referral to pelvic floor PT

## 2024-10-08 NOTE — Assessment & Plan Note (Signed)
-   The origin of pelvic floor muscle spasm can be multifactorial, including primary, reactive to a different pain source, trauma, or even part of a centralized pain syndrome.Treatment options include pelvic floor physical therapy, local (vaginal) or oral  muscle relaxants, pelvic muscle trigger point injections or centrally acting pain medications.   - discussed association with constipation, encouraged to consider pelvic floor PT

## 2024-10-08 NOTE — Assessment & Plan Note (Addendum)
-   avoid fluid intake 3 hours before bedtime - elevated your feet during the day or use compression socks to reduce lower extremity swelling - due to snoring, consider referral to neurology to rule out sleep apnea

## 2024-10-08 NOTE — Assessment & Plan Note (Addendum)
-   minimal bother due to infrequent symptoms - For treatment of stress urinary incontinence,  non-surgical options include expectant management, weight loss, physical therapy, as well as a pessary.  Surgical options include a midurethral sling, Burch urethropexy, and transurethral injection of a bulking agent. - desires expectant management at this time - encouraged to consider pelvic floor PT and low dose vaginal estrogen

## 2024-10-08 NOTE — Addendum Note (Signed)
 Addended by: KRYSTAL ANDREE GAILS on: 10/08/2024 04:02 PM   Modules accepted: Orders

## 2024-10-08 NOTE — Assessment & Plan Note (Signed)
-   For symptomatic vaginal atrophy options include lubrication with a water-based lubricant, personal hygiene measures and barrier protection against wetness, and estrogen replacement in the form of vaginal cream, vaginal tablets, or a time-released vaginal ring.   - encouraged to consider starting low dose vaginal estrogen, pt declines to postpone until after GI workup

## 2024-10-08 NOTE — Patient Instructions (Addendum)
 For night time frequency: - avoid fluid intake 3 hours before bedtime - elevated your feet during the day or use compression socks to reduce lower extremity swelling - due to snoring, consider referral to neurology to rule out sleep apnea  Constipation: Our goal is to achieve formed bowel movements daily or every-other-day.  You may need to try different combinations of the following options to find what works best for you - everybody's body works differently so feel free to adjust the dosages as needed.  Some options to help maintain bowel health include:  Dietary changes (more leafy greens, vegetables and fruits; less processed foods) Fiber supplementation (Benefiber, FiberCon, Metamucil or Psyllium). Start slow and increase gradually to full dose. Over-the-counter agents such as: stool softeners (Docusate or Colace) and/or laxatives (Miralax, milk of magnesia)  Power Pudding is a natural mixture that may help your constipation.  To make blend 1 cup applesauce, 1 cup wheat bran, and 3/4 cup prune juice, refrigerate and then take 1 tablespoon daily with a large glass of water as needed.   Women should try to eat at least 21 to 25 grams of fiber a day, while men should aim for 30 to 38 grams a day. You can add fiber to your diet with food or a fiber supplement such as psyllium (metamucil), benefiber, or fibercon.   Here's a look at how much dietary fiber is found in some common foods. When buying packaged foods, check the Nutrition Facts label for fiber content. It can vary among brands.  Fruits Serving size Total fiber (grams)*  Raspberries 1 cup 8.0  Pear 1 medium 5.5  Apple, with skin 1 medium 4.5  Banana 1 medium 3.0  Orange 1 medium 3.0  Strawberries 1 cup 3.0   Vegetables Serving size Total fiber (grams)*  Green peas, boiled 1 cup 9.0  Broccoli, boiled 1 cup chopped 5.0  Turnip greens, boiled 1 cup 5.0  Brussels sprouts, boiled 1 cup 4.0  Potato, with skin, baked 1 medium 4.0   Sweet corn, boiled 1 cup 3.5  Cauliflower, raw 1 cup chopped 2.0  Carrot, raw 1 medium 1.5   Grains Serving size Total fiber (grams)*  Spaghetti, whole-wheat, cooked 1 cup 6.0  Barley, pearled, cooked 1 cup 6.0  Bran flakes 3/4 cup 5.5  Quinoa, cooked 1 cup 5.0  Oat bran muffin 1 medium 5.0  Oatmeal, instant, cooked 1 cup 5.0  Popcorn, air-popped 3 cups 3.5  Brown rice, cooked 1 cup 3.5  Bread, whole-wheat 1 slice 2.0  Bread, rye 1 slice 2.0   Legumes, nuts and seeds Serving size Total fiber (grams)*  Split peas, boiled 1 cup 16.0  Lentils, boiled 1 cup 15.5  Black beans, boiled 1 cup 15.0  Baked beans, canned 1 cup 10.0  Chia seeds 1 ounce 10.0  Almonds 1 ounce (23 nuts) 3.5  Pistachios 1 ounce (49 nuts) 3.0  Sunflower kernels 1 ounce 3.0  *Rounded to nearest 0.5 gram. Source: [employer] for Standard Reference, Legacy Release    The origin of pelvic floor muscle spasm can be multifactorial, including primary, reactive to a different pain source, trauma, or even part of a centralized pain syndrome.Treatment options include pelvic floor physical therapy, local (vaginal) or oral  muscle relaxants, pelvic muscle trigger point injections or centrally acting pain medications.     For treatment of stress urinary incontinence, which is leakage with physical activity/movement/strainging/coughing, we discussed expectant management versus nonsurgical options versus surgery. Nonsurgical options include  weight loss, physical therapy, as well as a pessary.  Surgical options include a midurethral sling, which is a synthetic mesh sling that acts like a hammock under the urethra to prevent leakage of urine, a Burch urethropexy, and transurethral injection of a bulking agent.

## 2024-10-09 LAB — URINE CULTURE: Culture: NO GROWTH

## 2024-10-11 ENCOUNTER — Ambulatory Visit: Payer: Self-pay | Admitting: Obstetrics

## 2024-10-17 NOTE — Progress Notes (Unsigned)
 Cardiology Office Note  Date:  10/18/2024   ID:  Angela Buck 1941/04/01, MRN [id number]  PCP:  Laurence Locus, DO   Chief Complaint  Patient presents with   New Patient (Initial Visit)    Ref by Dr. Laurita from [facility] hospital for new A-Fib. Patient denies chest pain or shortness of breath nor A-Fib spells that she can feel.     HPI:  Angela Buck is a 83 y.o. 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 Laurita for atrial fibrillation  In the hospital November 2025 with small bowel obstruction, nausea vomiting abdominal pain CT scan confirming small bowel obstruction Seen by general surgery, started on NG tube to suction, symptoms improved with medical management  Noted to have new onset atrial fibrillation October 02, 2024 Started on beta-blocker, asymptomatic Converted spontaneously to normal sinus rhythm  No prior echocardiogram available No EKG from recent hospital admission available for review CV strips recorded in the computer from October 02, 2024 detailing atrial fibrillation  Active, bike, pickle ball, walking. Exercising weekly Denies significant tachycardia or palpitations concerning for A-fib/arrhythmia  EKG personally reviewed by myself  on todays visit EKG Interpretation Date/Time:  Monday October 18 2024 08:46:20 EST Ventricular Rate:  62 PR Interval:  146 QRS Duration:  70 QT Interval:  438 QTC Calculation: 444 R Axis:   67  Text Interpretation: Sinus rhythm with Premature atrial complexes When compared with ECG of 12-Jun-2021 22:43, Premature atrial complexes are now Present Confirmed by Perla Lye [phone]) on 10/18/2024 9:22:58 AM     PMH:   has a past medical history of Abdominal pain (03/16/2012), Cancer (HCC) (12-02-09), Chicken pox (as a child), Colonic polyp (01/05/2012), Elevated BP (01/05/2012), Fatigue (03/16/2012), History of mumps, HSV infection (04/20/2018), Leukopenia (05/18/2014), Mass of axilla (01/05/2012), Measles (as a child), Medicare annual wellness visit, subsequent (01/05/2012), Neutropenia (10/07/2016), Osteopenia (01/05/2012), Other and unspecified hyperlipidemia (05/18/2014), Pain of left heel (04/07/2017), Preventative health care (01/05/2012), Preventative health care (10/07/2016), Skin cancer (12/02/2009), Thyroid  disease (03/16/2012), and Vitamin D  deficiency (04/07/2017).   PSH:    Past Surgical History:  Procedure Laterality Date   biopsy of jawline  2011   CATARACT EXTRACTION, BILATERAL     b/l   laproscopy  1970   abdominal, endometriosis, with D&C   right hip replacement  2003   TONSILLECTOMY AND ADENOIDECTOMY  1949    Current Outpatient Medications  Medication Sig Dispense Refill   Ascorbic Acid (VITAMIN C) 100 MG tablet Take 500 mg by mouth daily.     Calcium 250 MG CAPS Take by mouth daily.     latanoprost (XALATAN) 0.005 % ophthalmic solution Place 1 drop  into both eyes at bedtime.     levothyroxine  (SYNTHROID ) 75 MCG tablet Take 1 tablet (75 mcg total) by mouth daily. 90 tablet 3   Misc Natural Products (COLON CARE PO) Take 1 capsule by mouth at bedtime. Colon Max     Red Yeast Rice 600 MG CAPS Take 1 capsule by mouth daily.     timolol (TIMOPTIC) 0.5 % ophthalmic solution Place 1 drop into  both eyes daily.     Vitamin D -Vitamin K (VITAMIN D2 + K1 PO) Take 1 capsule by mouth daily.     No current facility-administered medications for this visit.    Allergies:   Bee venom, Penicillamine, Penicillins, and Shellfish allergy   Social History:  The patient  reports that she has never smoked. She has never used smokeless tobacco. She reports current alcohol use. She reports that she does not use drugs.   Family History:   family history includes Arthritis in her mother; COPD in her father; Colon cancer in her paternal uncle; Dementia in her mother; Emphysema in her father; Glaucoma in her father and mother; Heart attack in her mother; Heart disease in her mother; Hypertension in her father and mother; Osteoporosis in her sister; Other in her paternal grandfather; Ulcers in her maternal grandmother.    Review of Systems: Review of Systems  Constitutional: Negative.   HENT: Negative.    Respiratory: Negative.    Cardiovascular: Negative.   Gastrointestinal: Negative.   Musculoskeletal: Negative.   Neurological: Negative.   Psychiatric/Behavioral: Negative.    All other systems reviewed and are negative.   PHYSICAL EXAM: VS:  BP 130/64 (BP Location: Right Arm, Patient Position: Sitting, Cuff Size: Normal)   Pulse 62   Ht 5' 3.5 (1.613 m)   Wt 146 lb 2 oz (66.3 kg)   SpO2 98%   BMI 25.48 kg/m  , BMI Body mass index is 25.48 kg/m. GEN: Well nourished, well developed, in no acute distress HEENT: normal Neck: no JVD, carotid bruits, or masses Cardiac: RRR; no murmurs, rubs, or gallops,no edema  Respiratory:  clear to auscultation bilaterally, normal work of breathing GI: soft, nontender, nondistended, + BS MS: no deformity or atrophy Skin: warm and dry, no rash Neuro:  Strength and sensation are intact Psych: euthymic mood, full affect   Recent Labs: 06/28/2024: TSH 0.61 10/01/2024: ALT 15 10/02/2024: Hemoglobin 11.4; Platelets 259 10/03/2024: BUN 10; Creatinine, Ser  1.03; Magnesium 1.8; Potassium 4.5; Sodium 139    Lipid Panel Lab Results  Component Value Date   CHOL 211 (H) 06/28/2024   HDL 61 06/28/2024   LDLCALC 131 (H) 06/28/2024   TRIG 93 06/28/2024      Wt Readings from Last 3 Encounters:  10/18/24 146 lb 2 oz (66.3 kg)  10/08/24 145 lb 3.2 oz (65.9 kg)  10/06/24 146 lb (66.2 kg)     ASSESSMENT AND PLAN:  Problem List Items Addressed This Visit       Cardiology Problems   New onset atrial fibrillation (HCC) - Primary   Relevant Orders   EKG 12-Lead (Completed)   Hyperlipidemia, mild     Other   Small bowel obstruction (HCC)   Chronic kidney disease, stage 3a (HCC)   Paroxysmal atrial fibrillation Noted while in the hospital seen on CD rhythm strips Converted to normal sinus rhythm on metoprolol Denies symptoms of tachycardia or palpitations Recommended Zio monitor for 2 weeks to rule out frequent paroxysmal A-fib -Discussed pulse oximeter monitoring at home, Kardia mobile device is,  Apple watch is for further extended heart rate monitoring - For paroxysmal tachycardia, may need propranolol/metoprolol as needed  Hyperlipidemia On red yeast rice Total cholesterol 211 On CT scan patchy aortoiliac calcified plaque We consider statin for more aggressive lipid management will defer to primary care  Chronic kidney disease GFR 55  Small bowel obstruction Recent hospitalization, resolved without surgical intervention    Signed, Velinda Lunger, M.D., Ph.D. [facility] [place], [place] [phone]

## 2024-10-18 ENCOUNTER — Ambulatory Visit

## 2024-10-18 ENCOUNTER — Ambulatory Visit: Attending: Cardiovascular Disease | Admitting: Cardiovascular Disease

## 2024-10-18 ENCOUNTER — Encounter: Payer: Self-pay | Admitting: Cardiovascular Disease

## 2024-10-18 VITALS — BP 130/64 | HR 62 | Ht 63.5 in | Wt 146.1 lb

## 2024-10-18 DIAGNOSIS — I4891 Unspecified atrial fibrillation: Secondary | ICD-10-CM

## 2024-10-18 DIAGNOSIS — E785 Hyperlipidemia, unspecified: Secondary | ICD-10-CM

## 2024-10-18 DIAGNOSIS — K56609 Unspecified intestinal obstruction, unspecified as to partial versus complete obstruction: Secondary | ICD-10-CM

## 2024-10-18 DIAGNOSIS — N1831 Chronic kidney disease, stage 3a: Secondary | ICD-10-CM

## 2024-10-18 NOTE — Patient Instructions (Addendum)
Medication Instructions:  No changes  If you need a refill on your cardiac medications before your next appointment, please call your pharmacy.   Lab work: No new labs needed  Testing/Procedures: Your physician has recommended that you wear a Zio monitor.   This monitor is a medical device that records the heart's electrical activity. Doctors most often use these monitors to diagnose arrhythmias. Arrhythmias are problems with the speed or rhythm of the heartbeat. The monitor is a small device applied to your chest. You can wear one while you do your normal daily activities. While wearing this monitor if you have any symptoms to push the button and record what you felt. Once you have worn this monitor for the period of time provider prescribed (Usually 14 days), you will return the monitor device in the postage paid box. Once it is returned they will download the data collected and provide [place] with a report which the provider will then review and we will call you with those results. Important tips:  Avoid showering during the first 24 hours of wearing the monitor. Avoid excessive sweating to help maximize wear time. Do not submerge the device, no hot tubs, and no swimming pools. Keep any lotions or oils away from the patch. After 24 hours you may shower with the patch on. Take brief showers with your back facing the shower head.  Do not remove patch once it has been placed because that will interrupt data and decrease adhesive wear time. Push the button when you have any symptoms and write down what you were feeling. Once you have completed wearing your monitor, remove and place into box which has postage paid and place in your outgoing mailbox.  If for some reason you have misplaced your box then call our office and we can provide another box and/or mail it off for you.  Follow-Up: At [facility], you and your health needs are our priority.  As part of our continuing mission to provide you  with exceptional heart care, we have created designated Provider Care Teams.  These Care Teams include your primary Cardiologist (physician) and Advanced Practice Providers (APPs -  Physician Assistants and Nurse Practitioners) who all work together to provide you with the care you need, when you need it.  You will need a follow up appointment in 12 months  Providers on your designated Care Team:   Murray Hodgkins, NP Christell Faith, PA-C Cadence Kathlen Mody, [place]  COVID-19 Vaccine Information can be found at: [email or web address] For questions related to vaccine distribution or appointments, please email [email or web address] or call [phone].

## 2024-10-19 ENCOUNTER — Other Ambulatory Visit (HOSPITAL_COMMUNITY): Payer: Self-pay | Admitting: Gastroenterology

## 2024-10-19 ENCOUNTER — Ambulatory Visit
Admission: RE | Admit: 2024-10-19 | Discharge: 2024-10-19 | Disposition: A | Discharge status: Home / Self Care | Source: Ambulatory Visit | Attending: Gastroenterology | Admitting: Gastroenterology

## 2024-10-19 DIAGNOSIS — R935 Abnormal findings on diagnostic imaging of other abdominal regions, including retroperitoneum: Secondary | ICD-10-CM

## 2024-10-19 DIAGNOSIS — K869 Disease of pancreas, unspecified: Secondary | ICD-10-CM | POA: Diagnosis not present

## 2024-10-19 MED ORDER — GADOBUTROL 1 MMOL/ML IV SOLN
6.0000 mL | Freq: Once | INTRAVENOUS | Status: AC | PRN
  Administered 2024-10-19: 6 mL via INTRAVENOUS

## 2024-10-21 ENCOUNTER — Ambulatory Visit
Admission: RE | Admit: 2024-10-21 | Discharge: 2024-10-21 | Disposition: A | Discharge status: Home / Self Care | Source: Ambulatory Visit | Attending: Gastroenterology | Admitting: Gastroenterology

## 2024-10-21 DIAGNOSIS — K56609 Unspecified intestinal obstruction, unspecified as to partial versus complete obstruction: Secondary | ICD-10-CM | POA: Diagnosis not present

## 2024-10-21 DIAGNOSIS — R935 Abnormal findings on diagnostic imaging of other abdominal regions, including retroperitoneum: Secondary | ICD-10-CM | POA: Insufficient documentation

## 2024-10-21 DIAGNOSIS — K439 Ventral hernia without obstruction or gangrene: Secondary | ICD-10-CM | POA: Diagnosis not present

## 2024-10-21 MED ORDER — IOHEXOL 300 MG/ML  SOLN
100.0000 mL | Freq: Once | INTRAMUSCULAR | Status: AC | PRN
Start: 2024-10-21 — End: 2024-10-21
  Administered 2024-10-21: 100 mL via INTRAVENOUS

## 2024-10-22 ENCOUNTER — Encounter: Payer: Self-pay | Admitting: Internal Medicine

## 2024-10-22 ENCOUNTER — Non-Acute Institutional Stay: Admitting: Internal Medicine

## 2024-10-22 VITALS — BP 136/72 | HR 63 | Temp 96.1°F | Ht 63.5 in | Wt 144.2 lb

## 2024-10-22 DIAGNOSIS — N1831 Chronic kidney disease, stage 3a: Secondary | ICD-10-CM | POA: Diagnosis not present

## 2024-10-22 DIAGNOSIS — K5901 Slow transit constipation: Secondary | ICD-10-CM | POA: Diagnosis not present

## 2024-10-22 DIAGNOSIS — Z0000 Encounter for general adult medical examination without abnormal findings: Secondary | ICD-10-CM | POA: Diagnosis not present

## 2024-10-22 DIAGNOSIS — E039 Hypothyroidism, unspecified: Secondary | ICD-10-CM

## 2024-10-22 DIAGNOSIS — Z8719 Personal history of other diseases of the digestive system: Secondary | ICD-10-CM | POA: Diagnosis not present

## 2024-10-22 DIAGNOSIS — E785 Hyperlipidemia, unspecified: Secondary | ICD-10-CM | POA: Diagnosis not present

## 2024-10-22 DIAGNOSIS — I480 Paroxysmal atrial fibrillation: Secondary | ICD-10-CM

## 2024-10-22 DIAGNOSIS — K8689 Other specified diseases of pancreas: Secondary | ICD-10-CM | POA: Diagnosis not present

## 2024-10-22 DIAGNOSIS — N393 Stress incontinence (female) (male): Secondary | ICD-10-CM | POA: Diagnosis not present

## 2024-10-22 MED ORDER — LEVOTHYROXINE SODIUM 75 MCG PO TABS: 75.0000 ug | ORAL_TABLET | Freq: Every day | ORAL | 3 refills | Status: AC

## 2024-10-22 NOTE — Assessment & Plan Note (Addendum)
 Recent episode of atrial fibrillation during hospitalization for bowel obstruction. EKG currently normal. Heart monitor recommended as a precaution. - Wear heart monitor as recommended by cardiologist.

## 2024-10-22 NOTE — Assessment & Plan Note (Signed)
 Last BMP 10-03-2024 showed BUN 10, Scr 1.03, eGFR 55

## 2024-10-22 NOTE — Assessment & Plan Note (Addendum)
 Recurrent small bowel obstruction Episodes of cramping, distension, and vomiting. Recent CT and enterography show no current obstruction or tumors. Previous episodes likely due to adhesions from past abdominal surgery. No current abdominal discomfort. - Avoid solid foods during episodes and sip on liquids. - Seek emergency care if experiencing severe stabbing pain or if symptoms persist beyond 24 hours.

## 2024-10-22 NOTE — Assessment & Plan Note (Addendum)
 I did not see any pancreatic mass my prelim interpretation of her MRI but need to wait for her final report. GI provider that ordered it will discuss her final results with her as well as the CT enterography

## 2024-10-22 NOTE — Assessment & Plan Note (Addendum)
 Continue with adequate hydration. Use fiber and laxatives to avoid straining with defecation.

## 2024-10-22 NOTE — Assessment & Plan Note (Addendum)
 Well-managed on levothyroxine  75 mcg daily. Recent thyroid  function tests were normal. - Continue current dose of levothyroxine  75 mcg daily.  Orders:   levothyroxine  (SYNTHROID ) 75 MCG tablet; Take 1 tablet (75 mcg total) by mouth daily.

## 2024-10-22 NOTE — Progress Notes (Signed)
 [facility] Outpatient Progress Note      Careteam: Patient Care Team: Laurence Locus, DO as PCP - General (Internal Medicine) Domenica Harlene LABOR, MD (Family Medicine) Robinson Pao, MD as Consulting Physician (Dermatology) Vernell Kindle as Referring Physician (Ophthalmology) PLACE OF SERVICE: [facility]   Advanced Directive information     Allergies  Allergen Reactions   Bee Venom Anaphylaxis and Other (See Comments)   Penicillamine Other (See Comments)   Penicillins Hives    Has patient had a PCN reaction causing immediate rash, facial/tongue/throat swelling, SOB or lightheadedness with hypotension: No Has patient had a PCN reaction causing severe rash involving mucus membranes or skin necrosis: No Has patient had a PCN reaction that required hospitalization No Has patient had a PCN reaction occurring within the last 10 years: No If all of the above answers are NO, then may proceed with Cephalosporin use.   Shellfish Allergy      Chief Complaint  Patient presents with   Sore Throat    Complains of sore throat. Wants to discuss Cardiology appointment and MRI and CT Scan done by Dr. Kristie.      HPI: Patient is a 83 y.o. female seen in today in [facility] outpatient clinic.  Discussed the use of AI scribe software for clinical note transcription with the patient, who gave verbal consent to proceed.  History of Present Illness   Angela Buck is an 83 year old female who presents with recurrent episodes of bowel obstruction.  Recurrent bowel obstruction - Five episodes over the past three years, most recently on Halloween - Each episode characterized by cramping, abdominal distension after eating, followed by vomiting - First episode during COVID pandemic required emergency room evaluation; imaging showed no abnormalities - Subsequent episodes managed at home until the most recent severe episode - Recent episode involved severe cramping, dry heaves, and  near-syncope - No recollection of ambulance transport or initial emergency room evaluation during recent episode - CT scan during recent episode revealed small bowel obstruction - NG tube placement provided symptom relief; no surgical intervention required - Discharged after several days in the hospital - No current abdominal discomfort  Cardiac arrhythmia - Episode of atrial fibrillation occurred during recent hospitalization - No prior history of cardiac arrhythmia or significant heart disease - Currently not wearing a heart monitor due to recent imaging studies - Plans to resume heart monitoring as a precaution - No palpitations, chest pain, or shortness of breath prior to recent episode  Gastrointestinal symptoms - Occasional sore throat attributed to recent cold - No current gastrointestinal complaints aside from resolved bowel obstruction  Surgical history - Laparoscopy for fertility studies at age 51 - History of hip replacement  Endocrine management - Levothyroxine  therapy for thyroid  management  Ophthalmologic management - Uses eye drops  Physical activity - Practices yoga regularly - Mindful of maintaining physical activity      Her previous female spouse is now deceased.  Patient living alone in a villa.  She is quite happy has lots of friends.   Review of Systems: Review of Systems  Constitutional: Negative.   HENT: Negative.    Eyes: Negative.   Respiratory: Negative.    Cardiovascular: Negative.   Gastrointestinal:        Recent SBO admission in October 2025.  Can feel it when she starts having obstructive symptoms.  Genitourinary:        Occ urinary incontinence when sneezing.  Musculoskeletal: Negative.   Skin: Negative.  Neurological: Negative.   Endo/Heme/Allergies: Negative.   Psychiatric/Behavioral: Negative.    All other systems reviewed and are negative.    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   Leukopenia 05/18/2014   Mass of axilla 01/05/2012   Measles as a child   Medicare annual wellness visit, subsequent 01/05/2012   Sees Dr Kristie of Gastroenterology    Neutropenia 10/07/2016   Osteopenia 01/05/2012   Other and unspecified hyperlipidemia 05/18/2014   Pain of left heel 04/07/2017   Preventative health care 01/05/2012   Preventative health care 10/07/2016   Skin cancer 12/02/2009   Small bowel obstruction (HCC) 10/01/2024   Thyroid  disease 03/16/2012   right    Vitamin D  deficiency 04/07/2017   Past Surgical History:  Procedure Laterality Date   biopsy of jawline  2011   CATARACT EXTRACTION, BILATERAL     b/l   laproscopy  1970   abdominal, endometriosis, with D&C   right hip replacement  2003   TONSILLECTOMY AND ADENOIDECTOMY  1949   Social History:  reports that she has never smoked. She has never used smokeless tobacco. She reports current alcohol use. She reports that she does not use drugs.   Family History  Problem Relation Age of Onset   Arthritis Mother    Hypertension Mother    Dementia Mother    Heart attack Mother    Glaucoma Mother    Heart disease Mother        MI, stent in 2004   Emphysema Father        smoker   Hypertension Father    Glaucoma Father    COPD Father    Osteoporosis Sister        Per [facility]  New Patient packet   Ulcers Maternal Grandmother    Other Paternal Grandfather        enlarged heart   Colon cancer Paternal Uncle    Bladder Cancer Neg Hx    Renal cancer Neg Hx    Uterine cancer Neg Hx      Medications: Patient's Medications  New Prescriptions   No medications on file  Previous Medications   ASCORBIC ACID (VITAMIN C) 100 MG TABLET    Take 500 mg by mouth daily.   CALCIUM 500 MG TABLET     Take 1,000 mg by mouth daily.   LATANOPROST  (XALATAN ) 0.005 % OPHTHALMIC SOLUTION    Place 1 drop into both eyes at bedtime.   LEVOTHYROXINE  (SYNTHROID ) 75 MCG TABLET    Take 1 tablet (75 mcg total) by mouth daily.   MISC NATURAL PRODUCTS (COLON CARE PO)    Take 1 capsule by mouth at bedtime. Colon Max   RED YEAST RICE 600 MG CAPS    Take 1 capsule by mouth daily.   TIMOLOL  (TIMOPTIC ) 0.5 % OPHTHALMIC SOLUTION    Place 1 drop into both eyes daily.   VITAMIN D -VITAMIN K (VITAMIN D2 + K1 PO)    Take 1 capsule by mouth daily.  Modified Medications   No medications on file  Discontinued Medications   No medications on file     Physical Exam:   Vitals:   10/22/24 1017  BP: 136/72  Pulse: 63  Temp: (!) 96.1 F (35.6 C)  SpO2: 99%  Weight: 144 lb 3.2 oz (65.4 kg)  Height: 5' 3.5 (1.613 m)   Body mass index is 25.14 kg/m. Wt Readings from Last 3 Encounters:  10/22/24 144 lb 3.2 oz (65.4 kg)  10/18/24 146 lb 2 oz (66.3 kg)  10/08/24 145 lb 3.2 oz (65.9 kg)     Physical Exam Vitals and nursing note reviewed.  Constitutional:      General: She is not in acute distress.    Appearance: She is not toxic-appearing or diaphoretic.     Comments: Looks much younger than stated age of 83 yo  HENT:     Head: Normocephalic and atraumatic.     Nose: Nose normal.  Eyes:     General: No scleral icterus. Cardiovascular:     Rate and Rhythm: Normal rate and regular rhythm.  Pulmonary:     Effort: Pulmonary effort is normal. No respiratory distress.     Breath sounds: Normal breath sounds.  Abdominal:     General: Bowel sounds are normal. There is no distension.     Palpations: Abdomen is soft.  Musculoskeletal:     Right lower leg: No edema.     Left lower leg: No edema.  Skin:    General: Skin is warm and dry.     Capillary Refill: Capillary refill takes less than 2 seconds.  Neurological:     General: No focal deficit present.     Mental Status: She is alert and oriented to  person, place, and time.    Labs reviewed: Basic Metabolic Panel: Recent Labs    02/02/24 0755 03/18/24 0740 06/28/24 0808 10/01/24 0500 10/02/24 0536 10/03/24 0650  NA 136  --  138 142 139 139  K 4.5  --  4.2 4.0 3.6 4.5  CL 102  --  104 99 104 105  CO2 31  --  28 25 26 23   GLUCOSE 85  --  78 204* 107* 89  BUN 14  --  21 21 14 10   CREATININE 0.97*  --  0.92 1.12* 1.08* 1.03*  CALCIUM 9.1  --  9.2  9.2 9.9 8.4* 8.5*  MG  --   --   --   --  2.2 1.8  PHOS  --   --  3.5  --   --   --   TSH 11.76* 6.65* 0.61  --   --   --    Liver Function Tests: Recent Labs    12/18/23 0735 06/28/24 0808 10/01/24 0500  AST 22 18 31   ALT 13 19 15   ALKPHOS  --   --  75  BILITOT 0.5 0.5 0.9  PROT 6.3 6.2 7.4  ALBUMIN  --   --  3.7   Recent Labs    10/01/24 0500  LIPASE 41    CBC: Recent Labs    12/18/23 0735 10/01/24 0500 10/02/24 0536  WBC 4.2 12.1* 8.0  NEUTROABS 2,155 9.8*  --   HGB 12.3 13.0 11.4*  HCT 38.9 39.5 35.3*  MCV 94.2 90.2 92.9  PLT 312 336 259   Lipid Panel: Recent Labs    06/28/24 0808  CHOL 211*  HDL 61  LDLCALC 131*  TRIG 93  CHOLHDL 3.5   TSH: Recent Labs    02/02/24 0755 03/18/24 0740 06/28/24 0808  TSH 11.76* 6.65* 0.61   A1C: Lab Results  Component Value Date   HGBA1C 5.8 (H) 06/28/2024  Assessment & Plan History of small bowel obstruction Recurrent small bowel obstruction Episodes of cramping, distension, and vomiting. Recent CT and enterography show no current obstruction or tumors. Previous episodes likely due to adhesions from past abdominal surgery. No current abdominal discomfort. - Avoid solid foods during episodes and sip on liquids. - Seek emergency care if experiencing severe stabbing pain or if symptoms persist beyond 24 hours.     Constipation by delayed colonic transit Continue with adequate hydration. Use fiber and laxatives to avoid straining with defecation.     Hyperlipidemia, mild Continue with red  yeast rice extract. She will let me know if she wants to use statin therapy.     PAF (paroxysmal atrial fibrillation) (HCC) Recent episode of atrial fibrillation during hospitalization for bowel obstruction. EKG currently normal. Heart monitor recommended as a precaution. - Wear heart monitor as recommended by cardiologist.     Pancreatic mass I did not see any pancreatic mass my prelim interpretation of her MRI but need to wait for her final report. GI provider that ordered it will discuss her final results with her as well as the CT enterography     Chronic kidney disease, stage 3a (HCC) - baseline BUN 10, Scr 1.03 Last BMP 10-03-2024 showed BUN 10, Scr 1.03, eGFR 55    Acquired hypothyroidism Well-managed on levothyroxine  75 mcg daily. Recent thyroid  function tests were normal. - Continue current dose of levothyroxine  75 mcg daily.  Orders:   levothyroxine  (SYNTHROID ) 75 MCG tablet; Take 1 tablet (75 mcg total) by mouth daily.  Encounter for general health examination Well-managed on levothyroxine  75 mcg daily. Recent thyroid  function tests were normal. - Continue current dose of levothyroxine  75 mcg daily.     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 Discussed potential use of vaginal estrogen or estrogen ring to improve urethral tissue and reduce incontinence. - Consider vaginal estrogen or estrogen ring for urethral tissue improvement.       Next appt: 1 year  Camellia Door, DO [facility] [phone]

## 2024-10-22 NOTE — Assessment & Plan Note (Addendum)
 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 Discussed potential use of vaginal estrogen or estrogen ring to improve urethral tissue and reduce incontinence. - Consider vaginal estrogen or estrogen ring for urethral tissue improvement.

## 2024-10-22 NOTE — Assessment & Plan Note (Addendum)
 Well-managed on levothyroxine  75 mcg daily. Recent thyroid  function tests were normal. - Continue current dose of levothyroxine  75 mcg daily.

## 2024-10-22 NOTE — Assessment & Plan Note (Addendum)
 Continue with red yeast rice extract. She will let me know if she wants to use statin therapy.

## 2024-11-08 ENCOUNTER — Ambulatory Visit: Payer: Self-pay

## 2024-11-08 DIAGNOSIS — H26491 Other secondary cataract, right eye: Secondary | ICD-10-CM | POA: Diagnosis not present

## 2024-11-08 DIAGNOSIS — G43101 Migraine with aura, not intractable, with status migrainosus: Secondary | ICD-10-CM | POA: Diagnosis not present

## 2024-11-08 DIAGNOSIS — H401131 Primary open-angle glaucoma, bilateral, mild stage: Secondary | ICD-10-CM | POA: Diagnosis not present

## 2024-11-08 DIAGNOSIS — H353131 Nonexudative age-related macular degeneration, bilateral, early dry stage: Secondary | ICD-10-CM | POA: Diagnosis not present

## 2024-11-08 NOTE — Telephone Encounter (Signed)
 FYI Only or Action Required?: FYI only for provider: appointment scheduled on 11/10/24.  Patient was last seen in primary care on 10/22/2024 by Laurence Locus, DO.  Called Nurse Triage reporting Visual Field Change.  Symptoms began several days ago.  Interventions attempted: Other: Saw eye doctor today.  Symptoms are: unchanged.  Triage Disposition: See PCP When Office is Open (Within 3 Days)  Patient/caregiver understands and will follow disposition?: Yes   Copied from CRM #[id number]. Topic: Clinical - Red Word Triage >> Nov 08, 2024 12:51 PM Alfonso ORN wrote: Red Word that prompted transfer to Nurse Triage: seeing wavy lines in peripheral vision in both eyes , it do not happen at the same time    ----------------------------------------------------------------------- From previous Reason for Contact - Scheduling: Patient/patient representative is calling to schedule an appointment. Refer to attachments for appointment information. Additional Information  Commented on: All Negative - Home Care    Mild intermittent wavy distortion in peripheral vision. No other neuro symptoms. Advised to see HCP in next 3 days per RN judgement.  Answer Assessment - Initial Assessment Questions Pt reports onset mild intermittent wavy distortion of vision in peripheral fields of vision this weekend. Alternates between left and right. Saw eye doctor today and reports nothing wrong with vision during exam. Advised by eye doctor to reach out to PCP to f/u on possible neuro issue. Also reports random intermittent sharp pain in occipital lobe area lasting up to a minute for past few months. Resolves when rubbing the area. Having some blurry vision now after having eyes dilated at eye doctors office. Denies numbness, tingling, weakness, increased difficulty walking or changes to speech or vision otherwise. Speaking in clear full coherent continuous sentences. Scheduled appt with different provider at home office on  Wednesday d/t no PCP availability within timeframe. Advised UC or ED for worsening symptoms.   1. DESCRIPTION: How has your vision changed? (e.g., complete vision loss, blurred vision, double vision, floaters, etc.)     Wavy distortion on periphery of vision on both sides.  2. LOCATION: One or both eyes? If one, ask: Which eye?     Bounces between both sides  3. SEVERITY: Can you see anything? If Yes, ask: What can you see? (e.g., fine print)     Mild  4. ONSET: When did this begin? Did it start suddenly or has this been gradual?     This weekend  5. PATTERN: Does this come and go, or has it been constant since it started?     Intermittent  6. PAIN: Is there any pain in your eye(s)?  (Scale 1-10; or mild, moderate, severe)     Denies  7. CONTACTS-GLASSES: Do you wear contacts or glasses?     Glasses  8. CAUSE: What do you think is causing this visual problem?     Unsure  9. OTHER SYMPTOMS: Do you have any other symptoms? (e.g., confusion, headache, arm or leg weakness, speech problems)     Random sharp pain last a minute in occipital lobe, resolves with rubbing.  Protocols used: Vision Loss or Change-A-AH

## 2024-11-08 NOTE — Telephone Encounter (Signed)
Appointment scheduled for 12/10.

## 2024-11-10 ENCOUNTER — Non-Acute Institutional Stay: Admitting: Orthopedic Surgery

## 2024-11-10 ENCOUNTER — Encounter: Payer: Self-pay | Admitting: Orthopedic Surgery

## 2024-11-10 VITALS — BP 132/68 | HR 67 | Temp 97.6°F | Ht 63.5 in | Wt 146.6 lb

## 2024-11-10 DIAGNOSIS — Z8719 Personal history of other diseases of the digestive system: Secondary | ICD-10-CM

## 2024-11-10 DIAGNOSIS — H53453 Other localized visual field defect, bilateral: Secondary | ICD-10-CM | POA: Diagnosis not present

## 2024-11-10 DIAGNOSIS — H40053 Ocular hypertension, bilateral: Secondary | ICD-10-CM

## 2024-11-10 NOTE — Progress Notes (Signed)
 Location:  Other Nursing Home Room Number: [facility] Place of Service:  Clinic (12) Provider:  Greig FORBES Cluster, NP   Laurence Locus, DO  Patient Care Team: Laurence Locus, DO as PCP - General (Internal Medicine) Domenica Harlene LABOR, MD (Family Medicine) Robinson Pao, MD as Consulting Physician (Dermatology) Vernell Kindle as Referring Physician (Ophthalmology)  Extended Emergency Contact Information Primary Emergency Contact: Doyle, Tegethoff Mobile Phone: [phone] Relation: Sister Interpreter needed? No Secondary Emergency Contact: Coward,Croom Mobile Phone: [phone] Relation: Friend  Code Status:  Full code Goals of care: Advanced Directive information    10/01/2024   11:54 AM  Advanced Directives  Does Patient Have a Medical Advance Directive? Yes  Type of Advance Directive Healthcare Power of Attorney  Does patient want to make changes to medical advance directive? No - Patient declined     Chief Complaint  Patient presents with   Vision Changes    Vision Changes. Complains of ripples in the Peripheral field. Started Friday. Saw Eye Dr. 12/8 and was advised to follow up with PCP for possible Neuro referral.     HPI:  Pt is a 83 y.o. female seen today for acute visit due to changes in vision.   Discussed the use of AI scribe software for clinical note transcription with the patient, who gave verbal consent to proceed.  History of Present Illness   Angela Buck is an 83 year old female who presents with visual disturbances.  She began experiencing visual disturbances last Friday while reading emails, characterized by an inability to focus with her right eye and described as 'wavy' and 'rippling' vision. This episode resolved within half an hour without associated headache or other symptoms. On Sunday, she experienced a similar episode with her left eye while watching a church service on TV, again without headache, and the symptoms resolved on their  own.  She has a family history of her father having a detached retina, which led to loss of eyesight due to delayed treatment. This history prompted her to seek immediate evaluation from her ophthalmologist. She has been diagnosed with early-stage macular degeneration and has a cataract in the right eye, which is being monitored. Her intraocular pressures were noted to be 21 in the right eye and 17 in the left eye. Her ophthalmologist recommended she have neurology evaluation.   She mentions occasional sharp pain in the occipital lobe area over the past couple of years, triggered by slight head movements, which resolves after rubbing the area. No history of head trauma, concussions, or migraines. She reports having had only two headaches in her life and none associated with the current visual disturbances.        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   Leukopenia 05/18/2014   Mass of axilla 01/05/2012   Measles as a child   Medicare annual wellness visit, subsequent 01/05/2012   Sees Dr Kristie of Gastroenterology    Neutropenia 10/07/2016   Osteopenia 01/05/2012   Other and unspecified hyperlipidemia 05/18/2014   Pain of left heel 04/07/2017   Preventative health care 01/05/2012   Preventative health care 10/07/2016   Skin cancer 12/02/2009   Small bowel obstruction (HCC) 10/01/2024  Thyroid  disease 03/16/2012   right    Vitamin D  deficiency 04/07/2017   Past Surgical History:  Procedure Laterality Date   biopsy of jawline  2011   CATARACT EXTRACTION, BILATERAL     b/l   laproscopy  1970   abdominal, endometriosis, with D&C   right hip replacement  2003   TONSILLECTOMY AND ADENOIDECTOMY  1949    Allergies  Allergen  Reactions   Bee Venom Anaphylaxis and Other (See Comments)   Penicillamine Other (See Comments)   Penicillins Hives    Has patient had a PCN reaction causing immediate rash, facial/tongue/throat swelling, SOB or lightheadedness with hypotension: No Has patient had a PCN reaction causing severe rash involving mucus membranes or skin necrosis: No Has patient had a PCN reaction that required hospitalization No Has patient had a PCN reaction occurring within the last 10 years: No If all of the above answers are NO, then may proceed with Cephalosporin use.   Shellfish Allergy     Outpatient Encounter Medications as of 11/10/2024  Medication Sig   Ascorbic Acid (VITAMIN C) 100 MG tablet Take 500 mg by mouth daily.   Calcium 500 MG tablet Take 1,000 mg by mouth daily.   latanoprost  (XALATAN ) 0.005 % ophthalmic solution Place 1 drop into both eyes at bedtime.   levothyroxine  (SYNTHROID ) 75 MCG tablet Take 1 tablet (75 mcg total) by mouth daily.   Misc Natural Products (COLON CARE PO) Take 1 capsule by mouth at bedtime. Colon Max   Red Yeast Rice 600 MG CAPS Take 1 capsule by mouth daily.   timolol  (TIMOPTIC ) 0.5 % ophthalmic solution Place 1 drop into both eyes daily.   Vitamin D -Vitamin K (VITAMIN D2 + K1 PO) Take 1 capsule by mouth daily.   No facility-administered encounter medications on file as of 11/10/2024.    Review of Systems  Constitutional: Negative.   HENT: Negative.    Eyes:  Positive for visual disturbance. Negative for photophobia, pain, discharge, redness and itching.  Respiratory:  Negative for cough and shortness of breath.   Cardiovascular:  Negative for chest pain and leg swelling.  Neurological:  Negative for dizziness and headaches.  Psychiatric/Behavioral:  Negative for dysphoric mood. The patient is not nervous/anxious.     Immunization History  Administered Date(s) Administered   Fluad Quad(high Dose 65+) 10/14/2022   Influenza-Unspecified 10/14/2022    Moderna Sars-Covid-2 Vaccination 12/20/2019, 01/17/2020, 10/13/2020, 04/19/2021   Pfizer Covid-19 Vaccine Bivalent Booster 80yrs & up 08/23/2021, 04/30/2022   Tdap 07/24/2021   Pertinent  Health Maintenance Due  Topic Date Due   Influenza Vaccine  07/02/2024   Bone Density Scan  Completed   Mammogram  Discontinued   Colonoscopy  Discontinued      06/10/2024    9:55 AM 06/25/2024    9:21 AM 10/06/2024    2:26 PM 10/22/2024   10:20 AM 11/10/2024    2:07 PM  Fall Risk  Falls in the past year? 0 0 0 0 0  Was there an injury with Fall? 0  0  0  0  0  Fall Risk Category Calculator 0 0 0 0 0  Patient at Risk for Falls Due to No Fall Risks No Fall Risks No Fall Risks No Fall Risks No Fall Risks  Fall risk Follow up Falls evaluation completed Falls evaluation completed Falls evaluation completed Falls evaluation completed Falls evaluation completed     Data saved with a previous flowsheet row definition   Functional Status Survey:  Vitals:   11/10/24 1404  BP: 132/68  Pulse: 67  Temp: 97.6 F (36.4 C)  SpO2: 98%  Weight: 146 lb 9.6 oz (66.5 kg)  Height: 5' 3.5 (1.613 m)   Body mass index is 25.56 kg/m. Physical Exam Vitals reviewed.  Constitutional:      General: She is not in acute distress. HENT:     Head: Normocephalic.  Eyes:     General:        Right eye: No discharge.        Left eye: No discharge.     Extraocular Movements: Extraocular movements intact.     Right eye: No nystagmus.     Left eye: Nystagmus present.     Pupils: Pupils are equal, round, and reactive to light.  Cardiovascular:     Rate and Rhythm: Normal rate and regular rhythm.     Pulses: Normal pulses.     Heart sounds: Normal heart sounds.  Pulmonary:     Effort: Pulmonary effort is normal.     Breath sounds: Normal breath sounds.  Abdominal:     Palpations: Abdomen is soft.  Musculoskeletal:        General: Normal range of motion.     Cervical back: Neck supple.  Skin:    General:  Skin is warm.     Capillary Refill: Capillary refill takes less than 2 seconds.  Neurological:     General: No focal deficit present.     Mental Status: She is alert and oriented to person, place, and time.  Psychiatric:        Mood and Affect: Mood normal.     Labs reviewed: Recent Labs    06/28/24 0808 10/01/24 0500 10/02/24 0536 10/03/24 0650  NA 138 142 139 139  K 4.2 4.0 3.6 4.5  CL 104 99 104 105  CO2 28 25 26 23   GLUCOSE 78 204* 107* 89  BUN 21 21 14 10   CREATININE 0.92 1.12* 1.08* 1.03*  CALCIUM 9.2  9.2 9.9 8.4* 8.5*  MG  --   --  2.2 1.8  PHOS 3.5  --   --   --    Recent Labs    12/18/23 0735 06/28/24 0808 10/01/24 0500  AST 22 18 31   ALT 13 19 15   ALKPHOS  --   --  75  BILITOT 0.5 0.5 0.9  PROT 6.3 6.2 7.4  ALBUMIN  --   --  3.7   Recent Labs    12/18/23 0735 10/01/24 0500 10/02/24 0536  WBC 4.2 12.1* 8.0  NEUTROABS 2,155 9.8*  --   HGB 12.3 13.0 11.4*  HCT 38.9 39.5 35.3*  MCV 94.2 90.2 92.9  PLT 312 336 259   Lab Results  Component Value Date   TSH 0.61 06/28/2024   Lab Results  Component Value Date   HGBA1C 5.8 (H) 06/28/2024   Lab Results  Component Value Date   CHOL 211 (H) 06/28/2024   HDL 61 06/28/2024   LDLCALC 131 (H) 06/28/2024   TRIG 93 06/28/2024   CHOLHDL 3.5 06/28/2024    Significant Diagnostic Results in last 30 days:  CT ENTERO ABD/PELVIS W CONTAST Result Date: 10/26/2024 EXAM: CT ABDOMEN AND PELVIS WITH CONTRAST (ENTEROGRAPHY) 10/21/2024 10:30:10 AM TECHNIQUE: CT of the abdomen and pelvis was performed after the administration of 100 mL of iohexol  (OMNIPAQUE ) 300 MG/ML solution and negative oral contrast. Automated exposure control, iterative reconstruction, and/or weight based adjustment of the mA/kV was utilized to reduce  the radiation dose to as low as reasonably achievable. COMPARISON: MRI of the abdomen of 10/19/2024. CT of 10/01/2024. CLINICAL HISTORY: hx of small bowel obstruction/eval for small bowel tumor  FINDINGS: LOWER CHEST: Normal heart size. Bibasilar scarring LIVER: Normal, without mass or intrahepatic biliary duct dilatation. GALLBLADDER AND BILE DUCTS: Gallbladder is unremarkable. No biliary ductal dilatation. SPLEEN: Normal in size and morphology. PANCREAS: Normal, without duct dilatation or acute inflammation. ADRENAL GLANDS: No acute abnormality. KIDNEYS, URETERS AND BLADDER: No renal mass or hydronephrosis. Normal urinary bladder. Grossly normal urinary bladder. STOMACH AND BOWEL: Gastric antral underdistention. The previously questioned small bowel malrotation is not apparent. The extent of small bowel distention with neutral contrast is good. Normal small bowel caliber. Normal colon and terminal ileum (normal terminal ileum on image 232/7). Resolved small bowel obstruction. No small bowel mass, stricture, or mesenteric abnormality. PERITONEUM AND RETROPERITONEUM: No ascites. No free air. VASCULATURE: Aorta is normal in caliber. Aortic atherosclerosis. LYMPH NODES: No lymphadenopathy. REPRODUCTIVE ORGANS: Hysterectomy. No adnexal mass. BONES AND SOFT TISSUES: Beam hardening artifact degrees evaluation of the pelvis secondary to right hip arthroplasty. Tiny fat containing ventral abdominal wall hernia. 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 No acute osseous abnormality. IMPRESSION: 1. No evidence of small bowel pathology. 2. Resolved small bowel obstruction without evidence of underlying mass. 3. No evidence of small bowel malrotation. 4. Aortic atherosclerosis (ICD-10 I70.0). Electronically signed by: Rockey Kilts MD 10/26/2024 10:58 AM EST RP Workstation: [id number]   MR ABDOMEN MRCP W WO CONTAST Result Date: 10/23/2024 EXAM: MRCP WITHOUT AND WITH IV CONTRAST 10/19/2024 10:43:09 AM TECHNIQUE: Multisequence, multiplanar magnetic resonance images of the abdomen without and with intravenous contrast. MRCP sequences were performed. CONTRAST: 6 mL of Gadavist . COMPARISON: CT abdomen and pelvis  06/13/2021 and 10/01/2024 CLINICAL HISTORY: Pancreatic lesion. FINDINGS: LIVER: Unremarkable. GALLBLADDER AND BILIARY SYSTEM: Gallbladder is unremarkable. No intrahepatic or extrahepatic ductal dilation. SPLEEN: Normal appearance of the spleen. PANCREAS/PANCREATIC DUCT: No pancreatic main duct dilatation or inflammation. There is motion artifact, which diminishes assessment of the previously characterized cystic lesion within the body on the current exam. The previously described cystic lesion measures 1 cm in maximum diameter. This is unchanged when compared with the comparison from 06/13/2024 No signs of internal enhancement within this lesion on the postcontrast images. Adjacent small cystic lesion measures 0.4 cm (coronal image 4/16). ADRENAL GLANDS: Normal size and morphology bilaterally. No nodule, thickening, or hemorrhage. No periadrenal stranding. KIDNEYS: Unremarkable. LYMPH NODES: No enlarged abdominal lymph nodes. VASCULATURE: Unremarkable. PERITONEUM: No ascites. ABDOMINAL WALL: No hernia. No mass. BOWEL: Grossly unremarkable. No bowel obstruction. BONES: No acute abnormality or worrisome osseous lesion. SOFT TISSUES: Unremarkable. MISCELLANEOUS: Unremarkable. IMPRESSION: 1. Stable 1 cm cystic lesion in the pancreatic body without internal enhancement, unchanged, with motion limiting evaluation. Stable since 06/13/2024. 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 (intraductal papillary mucinous neoplasm). According to consensus criteria, given the patient's age and stability since 2022 no further follow-up imaging is advised. Electronically signed by: Waddell Calk MD 10/23/2024 10:41 AM EST RP Workstation: [id number]   MR 3D Recon At Scanner Result Date: 10/23/2024 EXAM: MRCP WITHOUT AND WITH IV CONTRAST 10/19/2024 10:43:09 AM TECHNIQUE: Multisequence, multiplanar magnetic resonance images of the abdomen without and with intravenous contrast.  MRCP sequences were performed. CONTRAST: 6 mL of Gadavist . COMPARISON: CT abdomen and pelvis 06/13/2021 and 10/01/2024 CLINICAL HISTORY: Pancreatic lesion. FINDINGS: LIVER: Unremarkable. GALLBLADDER AND BILIARY SYSTEM: Gallbladder is unremarkable. No intrahepatic or extrahepatic  ductal dilation. SPLEEN: Normal appearance of the spleen. PANCREAS/PANCREATIC DUCT: No pancreatic main duct dilatation or inflammation. There is motion artifact, which diminishes assessment of the previously characterized cystic lesion within the body on the current exam. The previously described cystic lesion measures 1 cm in maximum diameter. This is unchanged when compared with the comparison from 06/13/2024 No signs of internal enhancement within this lesion on the postcontrast images. Adjacent small cystic lesion measures 0.4 cm (coronal image 4/16). ADRENAL GLANDS: Normal size and morphology bilaterally. No nodule, thickening, or hemorrhage. No periadrenal stranding. KIDNEYS: Unremarkable. LYMPH NODES: No enlarged abdominal lymph nodes. VASCULATURE: Unremarkable. PERITONEUM: No ascites. ABDOMINAL WALL: No hernia. No mass. BOWEL: Grossly unremarkable. No bowel obstruction. BONES: No acute abnormality or worrisome osseous lesion. SOFT TISSUES: Unremarkable. MISCELLANEOUS: Unremarkable. IMPRESSION: 1. Stable 1 cm cystic lesion in the pancreatic body without internal enhancement, unchanged, with motion limiting evaluation. Stable since 06/13/2024. 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 (intraductal papillary mucinous neoplasm). According to consensus criteria, given the patient's age and stability since 2022 no further follow-up imaging is advised. Electronically signed by: Waddell Calk MD 10/23/2024 10:41 AM EST RP Workstation: [id number]    Assessment/Plan 1. Decreased peripheral vision of both eyes (Primary) - began with right eye (30 minute episode), then left ( < 1 hour  episode)  - evaluated with ophthalmologist> recommended neurology referral - asymptomatic - no recent injury or trauma - exam unremarkable, slight nystagmus to left eye with EOM - she plans to f/u with ophthalmologist next week  - Ambulatory referral to Neurology  2. 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 History of small bowel obstruction - hospitalized 10/31-11/2 - general surgery recommended non surgical management - conditions improved with bowel rest, NG tube - asymptomatic today - abdomen soft - cont miralax prn  Family/ staff Communication: plan discussed with patient   Labs/tests ordered:  referral to neurology

## 2024-11-10 NOTE — Patient Instructions (Signed)
 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

## 2024-11-19 ENCOUNTER — Encounter: Payer: Self-pay | Admitting: Cardiovascular Disease

## 2024-11-24 ENCOUNTER — Ambulatory Visit: Payer: Self-pay | Admitting: Cardiovascular Disease

## 2024-11-24 DIAGNOSIS — I4891 Unspecified atrial fibrillation: Secondary | ICD-10-CM | POA: Diagnosis not present

## 2025-01-06 NOTE — Progress Notes (Unsigned)
 [facility] Return Visit  SUBJECTIVE  History of Present Illness: Angela Buck is a 84 y.o. female seen in follow-up for nocturia, constipation, stress urinary incontinence, and vaginal atrophy. Plan at last visit was referral to sleep study, titration of fiber supplementation.   Reports 80% improvement since working with massage therapist and acupuncture Mostly symptoms when she is lifting heavy things  SUI resolved since acupuncture, previously when she plays pickleball or URI Started period underwear during exercises Stopped pad use  Nocturia: 2-3 times per night down to 0-2x/night with small volume voids  Using ColonMax and increasing dietary fiber  Past Medical History: Patient  has a past medical history of Abdominal pain (03/16/2012), Cancer (HCC) (12/02/2009), Chicken pox (as a child), Colonic polyp (01/05/2012), Elevated BP (01/05/2012), Fatigue (03/16/2012), Feeling grief (01/12/2021), History of mumps, HSV infection (04/20/2018), Leukopenia (05/18/2014), Mass of axilla (01/05/2012), Measles (as a child), Medicare annual wellness visit, subsequent (01/05/2012), Neutropenia (10/07/2016), Osteopenia (01/05/2012), Other and unspecified hyperlipidemia (05/18/2014), Pain of left heel (04/07/2017), Preventative health care (01/05/2012), Preventative health care (10/07/2016), Skin cancer (12/02/2009), Small bowel obstruction (HCC) (10/01/2024), Thyroid  disease (03/16/2012), and Vitamin D  deficiency (04/07/2017).   Past Surgical History: She  has a past surgical history that includes right hip replacement (2003); biopsy of jawline (2011); laproscopy (1970); Tonsillectomy and adenoidectomy (1949); and Cataract extraction, bilateral.   Medications: She has a current medication list which includes the following prescription(s): vitamin c, calcium, latanoprost , levothyroxine , misc natural products, red yeast rice, timolol , and vitamin d -vitamin k.   Allergies: Patient is  allergic to bee venom, penicillamine, penicillins, and shellfish allergy.   Social History: Patient  reports that she has never smoked. She has never used smokeless tobacco. She reports current alcohol use. She reports that she does not use drugs.     OBJECTIVE     Physical Exam: Vitals:   01/07/25 1517  BP: (!) 145/75  Pulse: (!) 55   Gen: No apparent distress, A&O x 3.  Detailed Urogynecologic Evaluation:  Deferred.      ASSESSMENT AND PLAN    Ms. Givhan is a 84 y.o. with:  1. Nocturia     Nocturia Assessment & Plan: - resolved with acupuncture - continue to avoid fluid intake 3 hours before bedtime - will postpone sleep study due to resolution of symptoms       Lianne ONEIDA Gillis, MD

## 2025-01-07 ENCOUNTER — Ambulatory Visit: Admitting: Obstetrics

## 2025-01-07 ENCOUNTER — Encounter: Payer: Self-pay | Admitting: Obstetrics

## 2025-01-07 VITALS — BP 145/75 | HR 55

## 2025-01-07 DIAGNOSIS — R351 Nocturia: Secondary | ICD-10-CM

## 2025-01-07 NOTE — Assessment & Plan Note (Signed)
-   resolved with acupuncture - continue to avoid fluid intake 3 hours before bedtime - will postpone sleep study due to resolution of symptoms

## 2025-01-07 NOTE — Patient Instructions (Signed)
 Continue to monitor your urinary and vaginal symptoms.

## 2025-06-14 ENCOUNTER — Ambulatory Visit: Payer: Self-pay | Admitting: Nurse Practitioner

## 2025-07-05 ENCOUNTER — Ambulatory Visit: Admitting: Nurse Practitioner

## 2025-10-21 ENCOUNTER — Encounter: Admitting: Orthopedic Surgery
